# Patient Record
Sex: Female | Born: 1945 | Race: White | Hispanic: No | Marital: Married | Smoking: Never smoker
Health system: Southern US, Community
[De-identification: ages and names within clinical notes are randomized; demographics above are authoritative.]

## PROBLEM LIST (undated history)

## (undated) DIAGNOSIS — K219 Gastro-esophageal reflux disease without esophagitis: Secondary | ICD-10-CM

## (undated) DIAGNOSIS — T84018A Broken internal joint prosthesis, other site, initial encounter: Secondary | ICD-10-CM

## (undated) DIAGNOSIS — R109 Unspecified abdominal pain: Secondary | ICD-10-CM

## (undated) DIAGNOSIS — E785 Hyperlipidemia, unspecified: Secondary | ICD-10-CM

## (undated) DIAGNOSIS — J9600 Acute respiratory failure, unspecified whether with hypoxia or hypercapnia: Secondary | ICD-10-CM

## (undated) DIAGNOSIS — Z923 Personal history of irradiation: Secondary | ICD-10-CM

## (undated) DIAGNOSIS — E669 Obesity, unspecified: Secondary | ICD-10-CM

## (undated) DIAGNOSIS — M479 Spondylosis, unspecified: Secondary | ICD-10-CM

## (undated) DIAGNOSIS — M8580 Other specified disorders of bone density and structure, unspecified site: Secondary | ICD-10-CM

## (undated) DIAGNOSIS — M1991 Primary osteoarthritis, unspecified site: Secondary | ICD-10-CM

## (undated) DIAGNOSIS — K5730 Diverticulosis of large intestine without perforation or abscess without bleeding: Secondary | ICD-10-CM

## (undated) DIAGNOSIS — M1990 Unspecified osteoarthritis, unspecified site: Secondary | ICD-10-CM

## (undated) DIAGNOSIS — Z973 Presence of spectacles and contact lenses: Secondary | ICD-10-CM

## (undated) DIAGNOSIS — M5412 Radiculopathy, cervical region: Secondary | ICD-10-CM

## (undated) DIAGNOSIS — Q6589 Other specified congenital deformities of hip: Secondary | ICD-10-CM

## (undated) DIAGNOSIS — Z8489 Family history of other specified conditions: Secondary | ICD-10-CM

## (undated) DIAGNOSIS — Z8673 Personal history of transient ischemic attack (TIA), and cerebral infarction without residual deficits: Secondary | ICD-10-CM

## (undated) DIAGNOSIS — Z9189 Other specified personal risk factors, not elsewhere classified: Secondary | ICD-10-CM

## (undated) DIAGNOSIS — IMO0002 Reserved for concepts with insufficient information to code with codable children: Secondary | ICD-10-CM

## (undated) DIAGNOSIS — M129 Arthropathy, unspecified: Secondary | ICD-10-CM

## (undated) DIAGNOSIS — C50919 Malignant neoplasm of unspecified site of unspecified female breast: Secondary | ICD-10-CM

## (undated) DIAGNOSIS — R51 Headache: Secondary | ICD-10-CM

## (undated) DIAGNOSIS — Z9221 Personal history of antineoplastic chemotherapy: Secondary | ICD-10-CM

## (undated) DIAGNOSIS — G939 Disorder of brain, unspecified: Secondary | ICD-10-CM

## (undated) DIAGNOSIS — I10 Essential (primary) hypertension: Secondary | ICD-10-CM

## (undated) DIAGNOSIS — Z85828 Personal history of other malignant neoplasm of skin: Secondary | ICD-10-CM

## (undated) DIAGNOSIS — R079 Chest pain, unspecified: Secondary | ICD-10-CM

## (undated) DIAGNOSIS — Z8672 Personal history of thrombophlebitis: Secondary | ICD-10-CM

## (undated) DIAGNOSIS — M4312 Spondylolisthesis, cervical region: Secondary | ICD-10-CM

## (undated) DIAGNOSIS — Z96649 Presence of unspecified artificial hip joint: Secondary | ICD-10-CM

## (undated) DIAGNOSIS — Z9889 Other specified postprocedural states: Secondary | ICD-10-CM

## (undated) DIAGNOSIS — E7800 Pure hypercholesterolemia, unspecified: Secondary | ICD-10-CM

## (undated) DIAGNOSIS — R112 Nausea with vomiting, unspecified: Secondary | ICD-10-CM

## (undated) DIAGNOSIS — Z96611 Presence of right artificial shoulder joint: Secondary | ICD-10-CM

## (undated) DIAGNOSIS — M19011 Primary osteoarthritis, right shoulder: Secondary | ICD-10-CM

## (undated) HISTORY — DX: Reserved for concepts with insufficient information to code with codable children: IMO0002

## (undated) HISTORY — DX: Chest pain, unspecified: R07.9

## (undated) HISTORY — PX: MULTIPLE TOOTH EXTRACTIONS: SHX2053

## (undated) HISTORY — PX: CATARACT EXTRACTION W/ INTRAOCULAR LENS  IMPLANT, BILATERAL: SHX1307

## (undated) HISTORY — PX: SHOULDER ARTHROSCOPY: SHX128

## (undated) HISTORY — DX: Disorder of brain, unspecified: G93.9

## (undated) HISTORY — DX: Presence of unspecified artificial hip joint: Z96.649

## (undated) HISTORY — DX: Presence of right artificial shoulder joint: Z96.611

## (undated) HISTORY — DX: Other specified disorders of bone density and structure, unspecified site: M85.80

## (undated) HISTORY — DX: Arthropathy, unspecified: M12.9

## (undated) HISTORY — DX: Hyperlipidemia, unspecified: E78.5

## (undated) HISTORY — DX: Other specified congenital deformities of hip: Q65.89

## (undated) HISTORY — DX: Pure hypercholesterolemia, unspecified: E78.00

## (undated) HISTORY — PX: CHOLECYSTECTOMY: SHX55

## (undated) HISTORY — DX: Malignant neoplasm of unspecified site of unspecified female breast: C50.919

## (undated) HISTORY — DX: Diverticulosis of large intestine without perforation or abscess without bleeding: K57.30

## (undated) HISTORY — DX: Unspecified osteoarthritis, unspecified site: M19.90

## (undated) HISTORY — DX: Radiculopathy, cervical region: M54.12

## (undated) HISTORY — PX: COLONOSCOPY: SHX174

## (undated) HISTORY — DX: Unspecified abdominal pain: R10.9

## (undated) HISTORY — DX: Gastro-esophageal reflux disease without esophagitis: K21.9

## (undated) HISTORY — DX: Other specified personal risk factors, not elsewhere classified: Z91.89

## (undated) HISTORY — DX: Essential (primary) hypertension: I10

## (undated) HISTORY — DX: Obesity, unspecified: E66.9

## (undated) HISTORY — PX: ROTATOR CUFF REPAIR: SHX139

## (undated) HISTORY — DX: Primary osteoarthritis, unspecified site: M19.91

## (undated) HISTORY — DX: Acute respiratory failure, unspecified whether with hypoxia or hypercapnia: J96.00

## (undated) HISTORY — PX: TONSILLECTOMY: SHX5217

## (undated) HISTORY — PX: BUNIONECTOMY: SHX129

## (undated) HISTORY — DX: Spondylosis, unspecified: M47.9

## (undated) HISTORY — PX: TONSILLECTOMY: SUR1361

## (undated) HISTORY — PX: FOOT SURGERY: SHX648

## (undated) HISTORY — PX: TOTAL HIP ARTHROPLASTY: SHX124

## (undated) HISTORY — DX: Personal history of thrombophlebitis: Z86.72

## (undated) HISTORY — DX: Broken internal joint prosthesis, other site, initial encounter: T84.018A

## (undated) HISTORY — PX: OTHER SURGICAL HISTORY: SHX169

## (undated) HISTORY — DX: Spondylolisthesis, cervical region: M43.12

## (undated) HISTORY — PX: THUMB ARTHROSCOPY: SHX2509

---

## 1994-11-08 DIAGNOSIS — Z923 Personal history of irradiation: Secondary | ICD-10-CM

## 1994-11-08 DIAGNOSIS — Z9221 Personal history of antineoplastic chemotherapy: Secondary | ICD-10-CM

## 1994-11-08 HISTORY — PX: BREAST BIOPSY: SHX20

## 1994-11-08 HISTORY — DX: Personal history of irradiation: Z92.3

## 1994-11-08 HISTORY — PX: BREAST LUMPECTOMY: SHX2

## 1994-11-08 HISTORY — DX: Personal history of antineoplastic chemotherapy: Z92.21

## 1995-11-09 DIAGNOSIS — Z9221 Personal history of antineoplastic chemotherapy: Secondary | ICD-10-CM

## 1995-11-09 HISTORY — DX: Personal history of antineoplastic chemotherapy: Z92.21

## 1998-05-05 ENCOUNTER — Observation Stay (HOSPITAL_COMMUNITY): Admission: RE | Admit: 1998-05-05 | Discharge: 1998-05-06 | Payer: Self-pay | Admitting: Orthopedic Surgery

## 1999-07-16 ENCOUNTER — Other Ambulatory Visit: Admission: RE | Admit: 1999-07-16 | Discharge: 1999-07-16 | Payer: Self-pay | Admitting: *Deleted

## 1999-09-07 ENCOUNTER — Observation Stay (HOSPITAL_COMMUNITY): Admission: RE | Admit: 1999-09-07 | Discharge: 1999-09-08 | Payer: Self-pay | Admitting: Orthopedic Surgery

## 2000-01-18 ENCOUNTER — Other Ambulatory Visit: Admission: RE | Admit: 2000-01-18 | Discharge: 2000-01-18 | Payer: Self-pay | Admitting: *Deleted

## 2000-01-20 ENCOUNTER — Encounter: Admission: RE | Admit: 2000-01-20 | Discharge: 2000-01-20 | Payer: Self-pay | Admitting: *Deleted

## 2000-05-17 ENCOUNTER — Other Ambulatory Visit: Admission: RE | Admit: 2000-05-17 | Discharge: 2000-05-17 | Payer: Self-pay | Admitting: *Deleted

## 2000-09-08 ENCOUNTER — Other Ambulatory Visit: Admission: RE | Admit: 2000-09-08 | Discharge: 2000-09-08 | Payer: Self-pay | Admitting: *Deleted

## 2000-09-13 ENCOUNTER — Encounter: Admission: RE | Admit: 2000-09-13 | Discharge: 2000-09-13 | Payer: Self-pay | Admitting: *Deleted

## 2000-10-07 ENCOUNTER — Inpatient Hospital Stay (HOSPITAL_COMMUNITY): Admission: EM | Admit: 2000-10-07 | Discharge: 2000-10-08 | Payer: Self-pay | Admitting: Emergency Medicine

## 2000-10-07 ENCOUNTER — Encounter: Payer: Self-pay | Admitting: *Deleted

## 2000-12-21 ENCOUNTER — Other Ambulatory Visit: Admission: RE | Admit: 2000-12-21 | Discharge: 2000-12-21 | Payer: Self-pay | Admitting: *Deleted

## 2000-12-21 ENCOUNTER — Encounter (INDEPENDENT_AMBULATORY_CARE_PROVIDER_SITE_OTHER): Payer: Self-pay

## 2001-01-23 ENCOUNTER — Encounter: Admission: RE | Admit: 2001-01-23 | Discharge: 2001-01-23 | Payer: Self-pay | Admitting: *Deleted

## 2001-03-30 ENCOUNTER — Encounter: Payer: Self-pay | Admitting: Specialist

## 2001-04-05 ENCOUNTER — Observation Stay (HOSPITAL_COMMUNITY): Admission: RE | Admit: 2001-04-05 | Discharge: 2001-04-06 | Payer: Self-pay | Admitting: Specialist

## 2001-04-05 ENCOUNTER — Encounter: Payer: Self-pay | Admitting: Specialist

## 2001-08-28 ENCOUNTER — Encounter: Admission: RE | Admit: 2001-08-28 | Discharge: 2001-08-28 | Payer: Self-pay | Admitting: Specialist

## 2001-08-28 ENCOUNTER — Encounter: Payer: Self-pay | Admitting: Specialist

## 2001-12-14 ENCOUNTER — Encounter: Payer: Self-pay | Admitting: Internal Medicine

## 2001-12-14 ENCOUNTER — Ambulatory Visit (HOSPITAL_COMMUNITY): Admission: RE | Admit: 2001-12-14 | Discharge: 2001-12-14 | Payer: Self-pay | Admitting: Internal Medicine

## 2001-12-15 ENCOUNTER — Encounter: Payer: Self-pay | Admitting: Internal Medicine

## 2001-12-15 ENCOUNTER — Encounter: Admission: RE | Admit: 2001-12-15 | Discharge: 2001-12-15 | Payer: Self-pay | Admitting: Internal Medicine

## 2001-12-22 ENCOUNTER — Observation Stay (HOSPITAL_COMMUNITY): Admission: RE | Admit: 2001-12-22 | Discharge: 2001-12-23 | Payer: Self-pay | Admitting: Surgery

## 2001-12-22 ENCOUNTER — Encounter (INDEPENDENT_AMBULATORY_CARE_PROVIDER_SITE_OTHER): Payer: Self-pay | Admitting: Specialist

## 2001-12-22 ENCOUNTER — Encounter: Payer: Self-pay | Admitting: Surgery

## 2002-01-30 ENCOUNTER — Encounter: Admission: RE | Admit: 2002-01-30 | Discharge: 2002-01-30 | Payer: Self-pay | Admitting: *Deleted

## 2002-02-09 ENCOUNTER — Other Ambulatory Visit: Admission: RE | Admit: 2002-02-09 | Discharge: 2002-02-09 | Payer: Self-pay | Admitting: *Deleted

## 2003-01-29 ENCOUNTER — Encounter: Admission: RE | Admit: 2003-01-29 | Discharge: 2003-01-29 | Payer: Self-pay | Admitting: *Deleted

## 2003-02-05 ENCOUNTER — Encounter: Admission: RE | Admit: 2003-02-05 | Discharge: 2003-02-05 | Payer: Self-pay | Admitting: *Deleted

## 2003-02-12 ENCOUNTER — Other Ambulatory Visit: Admission: RE | Admit: 2003-02-12 | Discharge: 2003-02-12 | Payer: Self-pay | Admitting: *Deleted

## 2003-03-20 ENCOUNTER — Encounter: Payer: Self-pay | Admitting: Orthopedic Surgery

## 2003-03-26 ENCOUNTER — Inpatient Hospital Stay (HOSPITAL_COMMUNITY): Admission: RE | Admit: 2003-03-26 | Discharge: 2003-03-27 | Payer: Self-pay | Admitting: Orthopedic Surgery

## 2003-03-26 ENCOUNTER — Encounter: Payer: Self-pay | Admitting: Orthopedic Surgery

## 2003-09-13 ENCOUNTER — Encounter: Admission: RE | Admit: 2003-09-13 | Discharge: 2003-09-13 | Payer: Self-pay | Admitting: Specialist

## 2004-02-06 ENCOUNTER — Encounter: Admission: RE | Admit: 2004-02-06 | Discharge: 2004-02-06 | Payer: Self-pay | Admitting: Internal Medicine

## 2004-03-10 ENCOUNTER — Other Ambulatory Visit: Admission: RE | Admit: 2004-03-10 | Discharge: 2004-03-10 | Payer: Self-pay | Admitting: *Deleted

## 2004-04-22 ENCOUNTER — Inpatient Hospital Stay (HOSPITAL_COMMUNITY): Admission: RE | Admit: 2004-04-22 | Discharge: 2004-04-28 | Payer: Self-pay | Admitting: Orthopaedic Surgery

## 2005-02-08 ENCOUNTER — Encounter: Admission: RE | Admit: 2005-02-08 | Discharge: 2005-02-08 | Payer: Self-pay | Admitting: *Deleted

## 2006-03-18 ENCOUNTER — Encounter: Admission: RE | Admit: 2006-03-18 | Discharge: 2006-03-18 | Payer: Self-pay | Admitting: *Deleted

## 2006-10-25 DIAGNOSIS — D3500 Benign neoplasm of unspecified adrenal gland: Secondary | ICD-10-CM | POA: Insufficient documentation

## 2006-11-22 ENCOUNTER — Ambulatory Visit: Payer: Self-pay | Admitting: Internal Medicine

## 2006-12-15 ENCOUNTER — Encounter (INDEPENDENT_AMBULATORY_CARE_PROVIDER_SITE_OTHER): Payer: Self-pay | Admitting: Specialist

## 2006-12-15 ENCOUNTER — Ambulatory Visit: Payer: Self-pay | Admitting: Internal Medicine

## 2006-12-15 DIAGNOSIS — K5730 Diverticulosis of large intestine without perforation or abscess without bleeding: Secondary | ICD-10-CM

## 2006-12-15 HISTORY — DX: Diverticulosis of large intestine without perforation or abscess without bleeding: K57.30

## 2007-01-17 ENCOUNTER — Ambulatory Visit: Payer: Self-pay | Admitting: Internal Medicine

## 2007-01-23 ENCOUNTER — Ambulatory Visit: Payer: Self-pay | Admitting: Internal Medicine

## 2007-03-13 ENCOUNTER — Encounter: Admission: RE | Admit: 2007-03-13 | Discharge: 2007-03-13 | Payer: Self-pay | Admitting: Orthopaedic Surgery

## 2007-03-27 ENCOUNTER — Encounter: Admission: RE | Admit: 2007-03-27 | Discharge: 2007-03-27 | Payer: Self-pay | Admitting: Obstetrics and Gynecology

## 2007-03-30 ENCOUNTER — Ambulatory Visit: Payer: Self-pay | Admitting: Internal Medicine

## 2007-04-14 ENCOUNTER — Encounter: Admission: RE | Admit: 2007-04-14 | Discharge: 2007-04-14 | Payer: Self-pay | Admitting: Orthopaedic Surgery

## 2007-06-21 ENCOUNTER — Inpatient Hospital Stay (HOSPITAL_COMMUNITY): Admission: RE | Admit: 2007-06-21 | Discharge: 2007-06-24 | Payer: Self-pay | Admitting: Orthopaedic Surgery

## 2008-04-02 ENCOUNTER — Encounter: Admission: RE | Admit: 2008-04-02 | Discharge: 2008-04-02 | Payer: Self-pay | Admitting: Obstetrics & Gynecology

## 2008-04-23 DIAGNOSIS — Z9189 Other specified personal risk factors, not elsewhere classified: Secondary | ICD-10-CM

## 2008-04-23 DIAGNOSIS — I10 Essential (primary) hypertension: Secondary | ICD-10-CM

## 2008-04-23 DIAGNOSIS — Z8672 Personal history of thrombophlebitis: Secondary | ICD-10-CM

## 2008-04-23 DIAGNOSIS — M129 Arthropathy, unspecified: Secondary | ICD-10-CM

## 2008-04-23 DIAGNOSIS — Q6589 Other specified congenital deformities of hip: Secondary | ICD-10-CM

## 2008-04-23 DIAGNOSIS — C50919 Malignant neoplasm of unspecified site of unspecified female breast: Secondary | ICD-10-CM | POA: Insufficient documentation

## 2008-04-23 DIAGNOSIS — M479 Spondylosis, unspecified: Secondary | ICD-10-CM

## 2008-04-23 DIAGNOSIS — I6782 Cerebral ischemia: Secondary | ICD-10-CM

## 2008-04-23 DIAGNOSIS — G939 Disorder of brain, unspecified: Secondary | ICD-10-CM

## 2008-04-23 DIAGNOSIS — E7800 Pure hypercholesterolemia, unspecified: Secondary | ICD-10-CM | POA: Insufficient documentation

## 2008-04-23 HISTORY — DX: Other specified congenital deformities of hip: Q65.89

## 2008-04-23 HISTORY — DX: Cerebral ischemia: I67.82

## 2008-04-23 HISTORY — DX: Arthropathy, unspecified: M12.9

## 2008-04-23 HISTORY — DX: Essential (primary) hypertension: I10

## 2008-04-23 HISTORY — DX: Personal history of thrombophlebitis: Z86.72

## 2008-04-23 HISTORY — DX: Malignant neoplasm of unspecified site of unspecified female breast: C50.919

## 2008-04-23 HISTORY — DX: Spondylosis, unspecified: M47.9

## 2008-04-23 HISTORY — DX: Other specified personal risk factors, not elsewhere classified: Z91.89

## 2008-04-23 HISTORY — DX: Pure hypercholesterolemia, unspecified: E78.00

## 2008-08-27 ENCOUNTER — Telehealth: Payer: Self-pay | Admitting: Internal Medicine

## 2009-04-02 ENCOUNTER — Encounter: Admission: RE | Admit: 2009-04-02 | Discharge: 2009-04-02 | Payer: Self-pay | Admitting: Orthopaedic Surgery

## 2009-04-03 ENCOUNTER — Encounter: Admission: RE | Admit: 2009-04-03 | Discharge: 2009-04-03 | Payer: Self-pay | Admitting: Obstetrics & Gynecology

## 2009-07-31 ENCOUNTER — Encounter: Admission: RE | Admit: 2009-07-31 | Discharge: 2009-07-31 | Payer: Self-pay | Admitting: Orthopaedic Surgery

## 2009-08-26 ENCOUNTER — Encounter: Admission: RE | Admit: 2009-08-26 | Discharge: 2009-08-26 | Payer: Self-pay | Admitting: Obstetrics & Gynecology

## 2010-04-28 ENCOUNTER — Encounter: Admission: RE | Admit: 2010-04-28 | Discharge: 2010-04-28 | Payer: Self-pay | Admitting: Orthopedic Surgery

## 2010-05-07 ENCOUNTER — Ambulatory Visit (HOSPITAL_COMMUNITY): Admission: RE | Admit: 2010-05-07 | Discharge: 2010-05-07 | Payer: Self-pay | Admitting: Orthopedic Surgery

## 2010-05-15 ENCOUNTER — Ambulatory Visit (HOSPITAL_COMMUNITY): Admission: RE | Admit: 2010-05-15 | Discharge: 2010-05-16 | Payer: Self-pay | Admitting: Orthopedic Surgery

## 2010-05-24 ENCOUNTER — Observation Stay (HOSPITAL_COMMUNITY): Admission: EM | Admit: 2010-05-24 | Discharge: 2010-05-26 | Payer: Self-pay | Admitting: Emergency Medicine

## 2010-07-14 ENCOUNTER — Encounter: Admission: RE | Admit: 2010-07-14 | Discharge: 2010-07-14 | Payer: Self-pay | Admitting: Orthopaedic Surgery

## 2010-08-28 ENCOUNTER — Encounter: Admission: RE | Admit: 2010-08-28 | Discharge: 2010-08-28 | Payer: Self-pay | Admitting: Obstetrics & Gynecology

## 2011-01-23 LAB — BASIC METABOLIC PANEL
CO2: 30 mEq/L (ref 19–32)
Calcium: 9.6 mg/dL (ref 8.4–10.5)
Creatinine, Ser: 0.6 mg/dL (ref 0.4–1.2)
GFR calc Af Amer: >60 mL/min (ref >60)
Glucose, Bld: 102 mg/dL — ABNORMAL HIGH (ref 70–99)

## 2011-01-23 LAB — CBC
MCH: 30.5 pg (ref 26.0–34.0)
MCHC: 33.6 g/dL (ref 30.0–36.0)
Platelets: 258 10*3/uL (ref 150–400)

## 2011-01-23 LAB — DIFFERENTIAL
Basophils Relative: 1 % (ref 0–1)
Eosinophils Absolute: 0.2 10*3/uL (ref 0.0–0.7)
Neutrophils Relative %: 47 % (ref 43–77)

## 2011-01-24 LAB — BASIC METABOLIC PANEL
Calcium: 9.5 mg/dL (ref 8.4–10.5)
GFR calc Af Amer: >60 mL/min (ref >60)
GFR calc non Af Amer: >60 mL/min (ref >60)
Glucose, Bld: 90 mg/dL (ref 70–99)
Sodium: 143 mEq/L (ref 135–145)

## 2011-01-24 LAB — SURGICAL PCR SCREEN: MRSA, PCR: NEGATIVE

## 2011-03-23 NOTE — Op Note (Signed)
NAMEMARQUETTA, [name]              ACCOUNT NO.:  [id number]   MEDICAL RECORD NO.:  [id number]          PATIENT TYPE:  INP   LOCATION:  2550                         FACILITY:  [facility]   PHYSICIAN:  [name], M.D.        DATE OF BIRTH:  Mar 21, 1946   DATE OF PROCEDURE:  06/21/2007  DATE OF DISCHARGE:                               OPERATIVE REPORT   DIAGNOSIS:  1. 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  2. Lumbar spinal stenosis   PROCEDURE:  1. Exploration of L2-L3 and L3-L4 fusion with removal of      instrumentation.  2. Posterior spinal fusion L4 through S1.  3. Segmental pedicle screw instrumentation L3 through S1.  4. Lumbar laminectomy L4-L5 and L5-S1 with decompression of the thecal      sac and nerve roots.  5. Transforaminal lumbar interbody fusion L4-L5 and L5-S1 with      placement of two PEEK cages.  6. Local autogenous bone graft supplemented with 5 mL Grafton      allograft and OP1 BMP   SURGEON:  [name], M.D.   ASSISTANT:  Colleen Mahar, P.A.-C.   ANESTHESIA:  General endotracheal.   ESTIMATED BLOOD LOSS:  400 mL   COMPLICATIONS:  None.   COUNTS:  Needle and sponge counts correct.   INDICATIONS:  The patient is a pleasant 65 year old female who is three  years status post thoracolumbar fusion for adult scoliosis.  She did  very well after the surgery and became essentially pain free.  Over the  past several months, she has developed severe lower back pain as well as  weakness in the left lower extremity.  Her imaging studies show advanced  degeneration at L4-L5 and L5-S1 which has progressed over the years.  She has failed other conservative treatments and now elects to undergo  extension of the decompression fusion across the L4-5 and L5-S1  segments.  Risks, benefits, and alternatives were reviewed and the  patient elected to proceed.   DESCRIPTION OF PROCEDURE:  After informed consent, she  was taken to the  operating room.  She underwent general endotracheal anesthesia without  difficulty and given prophylactic IV antibiotics.  Neural monitoring was  established in the form of lower extremity EMGs and SSEPs.  She was  carefully turned prone onto the AcroMed positioning frame.  All bony  prominences were padded.  The face and eyes were protected at all times.  The back was prepped and draped in the usual sterile fashion.  The  previous midline incision was utilized.  Dissection was carried through  the deep fascia.  There was a significant amount of scarring, as  expected, from the previous surgery.  The L5 and S1 spinous processes  were remaining and they were identified and used as landmarks.  Dissection was carried proximally and the instrumentation was identified  and exposed.  Deep retractors were placed.   We then performed further dissection through the scar tissue exposing  the bony elements.  We were then able to see enough  of the anatomy to  place pedicle screws at L4, L5 and S1 on the left side using anatomic  probing technique.  We utilized 6.5 mm screws at L4 and L5 and 7.5 mm  screws in the sacrum.  Before placing the screws, the pedicle holes were  palpated and there were no breeches.  Each pedicle was then tapped and  then, once again, palpated.  The bone quality was good and the screw  purchase was excellent.  We then performed a similar procedure on the  right side at L5 and S1.  There was already a pedicle screw at L4 on the  right.  Before placing the pedicle screws, we decorticated the  transverse processes of L4, L5 and the sacral ala bilaterally in  preparation for the arthrodesis.   At this point, we turned our attention to performing a laminectomy.  Further debridement of the scar tissue allowed exposure of the  ligamentum flavum and underlying lamina.  A high speed bur was used to  take down the lamina and then the Kerrison punches were utilized  to  complete the laminectomy and remove the ligamentum flavum.  The lateral  recesses were decompressed.  We then morselized the laminectomy bone and  mixed it with 5 mL of Grafton allograft and OP1 BMP.  This mixture was  then packed into the lateral gutters from L4 down to S1 completing the  posterior spinal fusion.  At this point, we elected to proceed with a  transforaminal lumbar interbody fusion at L4-L5 and L5-S1 on the left  side to further decompress the neural foramen and also to further reduce  the scoliosis and improve the fusion rate.   On the left side, the remaining facet joints were osteotomized. The  exiting transversing nerve roots were identified and free running EMGs  were monitored at all times.  Starting at L4-L5, the disc space was  entered and a radical discectomy was completed.  We dilated the disc  space up to 8 mm.  We then cleaned the cartilaginous endplates with  curved curets.  The disc space was packed with the bone graft mixture.  We then packed an 8 mm PEEK cage with the bone graft mixture, tamped it  into the interspace in an oblique fashion.  We achieved good distraction  of the foramen.  We performed a similar procedure at L5-S1. Again, we  utilized an 8 mm implant.  The disc space was cleaned out and the  cartilaginous endplates were scraped clean.  The disc space was again  packed with the bone graft mixture and then the cage was inserted.  Throughout the TLIF procedures, there were no deleterious changes in the  free running EMGs.   At this point, we completed the posterior spinal fusion by attaching  titanium rods to the polyaxial screw heads on the left side at L4, L5  and S1.  On the right side, we cut the rod just above the L3 pedicle  screw.  We then explored the arthrodesis at L2-L3 and L3-L4 and this was  done using the electrocautery.  We confirmed that there was a solid  fusion between L2-L3 and L3-L4 and proceeded to place a new rod from  L2  all the way down to S1.  This was placed into the pedicle screw heads on  the right side.  We applied compression on this side at L4-L5 and L5-S1  to help reduce the spinal deformity.  The locking caps were  placed and  sheared off.  We placed a cross connector distally.  The wound was  irrigated.  A deep Hemovac drain was placed.  Meticulous hemostasis was  achieved.  The deep fascia was closed with a running #1 Vicryl suture,  the subcutaneous layer closed with 0 Vicryl, 2-0 Vicryl, followed by  running 3-0 subcuticular Vicryl suture on the skin edge.  Dermabond was  applied.  A sterile dressing was placed.  The patient was turned supine,  extubated without difficulty, and transferred to the recovery room in  stable condition.   It should be noted my assistant, [employer], P.A.-C., was present  throughout procedure.  She assisted with the positioning, the exposure  by using the Cobb elevators and the suction.  She then assisted with the  decompression by retracting the neural elements and also with the  arthrodesis, the TLIF and the instrumentation.  She then assisted with  wound closure.      [name], M.D.  Electronically Signed     MC/MEDQ  D:  06/21/2007  T:  06/22/2007  Job:  [id number]

## 2011-03-23 NOTE — Assessment & Plan Note (Signed)
[facility]                         GASTROENTEROLOGY OFFICE NOTE   NAME:[name], [name]                     MRN:          [id number]  DATE:03/30/2007                            DOB:          02/21/1946    [name] is a very nice 65 year old white female with newly  diagnosed inflammatory bowel disease, positive IBD markers consistent  with Crohn's disease.  She also has a positive family history of colon  cancer in her father and history of breast CA.  She is being evaluated  by Dr. Noel Gerold for spinal fusion due to severe DJD of her back.  Dr. Noel Gerold  has been reluctant to schedule her for surgery because she has been on  immunomodulator, 6-Mercaptopurine 50 mg a day which she started four  weeks ago.  She also has continued on a tapering dose of Anticort which  is budesonide.  She is down to 3 mg daily.  Her symptoms of IBD are  completely resolved.  She denies any abdominal pain or diarrhea.   PHYSICAL EXAMINATION:  VITAL SIGNS:  Blood pressure 120/82, pulse 78,  weight 169 pounds.   The patient was not reexamined today.   IMPRESSION:  33. A 65 year old white female with inflammatory bowel disease,      currently asymptomatic.  2. 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  3. The patient needs orthopedic surgery by Dr. Noel Gerold which requires      her to be off of her immunosuppressive medications.   PLAN:  It is okay to discontinue her 6-MP and Anticort.  If the diarrhea  recurs, we will use Lomotil or antibiotics or some other antimotility  agents to control it.  I suspect she will probably need to be off of  these medications  for about 12 months before we can consider restarting  it.  I would like to see her in the office in about three months after  her surgery to discuss her condition and her colitis.  We will be happy  to see the patient in the hospital if necessary.     [name]. Anna Chance, MD  Electronically  Signed    DMB/MedQ  DD: 03/30/2007  DT: 03/30/2007  Job #: [id number]   cc:   [name], M.D.  [name], M.D.

## 2011-03-23 NOTE — Discharge Summary (Signed)
[name], [name]              ACCOUNT NO.:  [id number]   MEDICAL RECORD NO.:  [id number]          PATIENT TYPE:  INP   LOCATION:  5009                         FACILITY:  [facility]   PHYSICIAN:  Valetta Close, M.D.   DATE OF BIRTH:  Apr 30, 1946   DATE OF ADMISSION:  06/21/2007  DATE OF DISCHARGE:  06/24/2007                               DISCHARGE SUMMARY   ADMISSION DIAGNOSES:  1. 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  2. History of breast cancer.  3. Crohn disease.  4. Gastroesophageal reflux disease.  5. Hypertension.  6. Migraine headaches.  7. Osteoarthritis.   DISCHARGE DIAGNOSES:  1. Status post revision posterior spinal fusion with extension of her      hardware down from L4 to the sacrum.  2. Postoperative blood loss anemia.  3. Postoperative mild hypokalemia.  4. 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  5. History of breast cancer.  6. Crohn disease.  7. Gastroesophageal reflux disease.  8. Hypertension.  9. Migraine headaches.  10.Osteoarthritis.   PROCEDURE:  On June 21, 2007, the patient was taken to the operating  room for posterior spinal fusion, L4 to S1, laminectomy, L4-S1.  Surgeon  was Sharolyn Douglas, MD, assistant [facility] PA-C.  Anesthesia was  general.   CONSULTATIONS:  None.   LABS:  CBC with differential preoperatively was normal with the  exception of a hemoglobin of 15.3.  Coagulation studies preop normal.  Complete metabolic panel preop was normal.  Postoperatively H&H were  monitored daily x3 days.  Hemoglobin reached a low of 11.0, hematocrit  of 32.5 on June 13, 2007.  Basic metabolic panel monitored x2 days  postoperatively.  She developed mild hypokalemia on June 23, 2007, of  3.3, otherwise remained normal throughout her hospital course.  UA from  preop was negative with the exception of pH of 8.5.  Her blood type is  O+, antibody screen was  negative.  Urine culture from preop showed  insignificant growth.  X-rays were done postoperatively to confirm screw  placement.  EKG from June 19, 2007, shows sinus rhythm with marked  sinus arrhythmia, otherwise normal, unconfirmed on the chart.   BRIEF HISTORY:  The patient is a 65 year old female who underwent a  scoliosis fusion 3 years ago and did extremely well after surgery for  approximately 2-1/2 years.  Unfortunately, she started developing pain  and weakness into her lower extremities as well as increasing back pain.  She was found to have significant of progression of her arthritis and  degeneration in the segments the scoliosis from L4 to S1.  After failing  conservative treatment, Dr. Noel Gerold felt her best course of management  would be extending the scoliosis fusion from L4 down across the sacrum.  The risks and benefits of the procedure were discussed with the patient  at length by Dr. Noel Gerold as well as myself.  She indicated understanding  and opted to proceed.   HOSPITAL COURSE:  On June 21, 2007, the patient was  taken to the  operating room for the above-listed procedure.  She tolerated the  procedure well without any intraoperative complications and was then  transferred to the recovery room in stable condition.   Postoperatively routine orthopedic spine protocol was followed and she  progressed along very well.   Physical therapy and occupational therapy worked with the patient on a  daily basis on a progressive ambulation program, brace use and back  precautions.  She progressed along well with them and was independent  with all of the above prior to discharge.   The patient did not develop any medical complications through her  postoperative stay.  She was stable for discharge home by June 24, 2007.   DISCHARGE/PLAN:  The patient is a 65 year old female status post  revision and extension of scoliosis fusion across L4 to S1.  Doing very  well.    ACTIVITIES:  Daily ambulation program.  Brace on when she is up.  Back  precautions at all times.  No lifting over 5 pounds.  The patient may  shower.   FOLLOW-UP:  Two weeks postoperatively with Dr. Noel Gerold.   MEDICATIONS ON DISCHARGE:  Dilaudid for pain, Robaxin for muscle spasm,  multivitamin daily, calcium daily, Colace twice daily.  Laxative as  needed.  Avoid NSAIDs x3 months.  Resume home medications.   Follow up 2 weeks postoperatively.   CONDITION ON DISCHARGE:  Stable, improved.   DISPOSITION:  The patient is to be discharged to her home with a  friend's assistance and home health physical therapy and occupational  therapy.      [name], P.A.    ______________________________  Valetta Close, M.D.    CM/MEDQ  D:  06/24/2007  T:  06/25/2007  Job:  [id number]

## 2011-03-23 NOTE — Op Note (Signed)
[name], [name]              ACCOUNT NO.:  [id number]   MEDICAL RECORD NO.:  [id number]          PATIENT TYPE:  INP   LOCATION:  2550                         FACILITY:  [facility]   PHYSICIAN:  Sharolyn Douglas, M.D.        DATE OF BIRTH:  [birth year]   DATE OF PROCEDURE:  06/21/2007  DATE OF DISCHARGE:                               OPERATIVE REPORT   DIAGNOSIS:  1. Lumbar spondylosis and adjacent segment degeneration L4-L5 and L5-      S1 below a previous scoliosis fusion.  2. Lumbar spinal stenosis and foraminal narrowing.   PROCEDURE:  1. Exploration of fusion L2-L3 and L3-L4 with removal of      instrumentation.  2. Posterior spinal fusion L4 through S1.  3. Segmental pedicle screw instrumentation L4 through S1.  4. Transforaminal lumbar interbody fusion L4-L5 and L5-S1 with      placement of two PEEK cages.  5. Revision laminectomy L4-L5 and L5-S1   SURGEON:  Sharolyn Douglas, M.D.   ASSISTANT:  Jill Side Mahar, P.A.-C.   Dictation ends here.      Sharolyn Douglas, M.D.  Electronically Signed     MC/MEDQ  D:  06/21/2007  T:  06/22/2007  Job:  [id number]

## 2011-03-26 NOTE — Assessment & Plan Note (Signed)
[facility]                         GASTROENTEROLOGY OFFICE NOTE   NAME:[name], [name]                     MRN:          [id number]  DATE:11/22/2006                            DOB:          19-Oct-1946    Ms. Enneking is a very nice 65 year old white female who is here today  for evaluation of diarrhea, gas, frequent bowel movements, change in the  caliber of the stools. We saw her in the past for similar problems. 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   MEDICATIONS:  1. Evista 60 mg p.o. daily.  2. Celebrex 200 mg p.o. daily.  3. Darvocet-N 100.  4. Ranitidine 300 mg p.o. daily.  5. Lisinopril 12.5 mg p.o. daily.   PAST MEDICAL HISTORY:  Significant for high blood pressure, high  cholesterol, arthritis for 20 years.   OPERATIONS:  Cholecystectomy 2003, tubal ligation 1980, breast surgery  for breast cancer 1996.   FAMILY HISTORY:  Positive for colon cancer in her father, heart disease  in father, breast cancer in mother and grandmother.   SOCIAL HISTORY:  Married with four children, two of them are  stepchildren. 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 Does not  smoke. Drinks alcohol seldom.   REVIEW OF SYSTEMS:  Stable weight, eye glasses, arthritic  complains,  backpain, new headaches, shortness of breath, muscle pains.   PHYSICAL EXAMINATION:  VITAL SIGNS:  Blood pressure 102/72, pulse 100,  weight 169 pounds.  GENERAL:  She was alert, oriented, and in no distress.  NECK:  Thyroid was normal.  NEUROLOGICAL:  No resting tremor.  LUNGS:  Clear to auscultation.  COR:  With normal S1, S2.  ABDOMEN:  Soft with hyperactive bowel sounds. No tenderness except in  the right lower quadrant. No palpable  mass. Liver edge at costal margin,  post cholecystotomy scar.  RECTAL:  Normal rectal tone. Hemoccult negative.   CT scan of the abdomen done on October 25, 2006 showed status post  cholecystectomy with no acute changes, some sigmoid diverticulosis  without diverticulitis.   IMPRESSION:  A 65 year old white female with chronic diarrhea now having  up to eight loose bowel movements a day without any occult blood loss.  May need to rule out microscopic or collagenous  colitis, inflammatory  bowel disease of the terminal ileum or  bacterial overgrowth due to  incompetent ileocecal valve which causes reflux of the bacteria into the  small bowel. Rule out post cholecystectomy, diarrhea. Rule out irritable  bowel syndrome. Rule out celiac sprue.   PLAN:  1. Tissue transglutaminase  levels today. Also sedimentation rate and      IBD markers.  2. A colonoscopy scheduled.  3. Questran 4 g daily in water or juice, take at least 1 hour apart      from other medications  4. Levbid 0.375 mg p.o. b.i.d. to slow  down the transit time. Consider      small bowel followthrough or further studies to evaluate her      diarrhea.     Hedwig Morton. Juanda Chance, MD  Electronically Signed    DMB/MedQ  DD: 11/22/2006  DT: 11/23/2006  Job #: [id number]   cc:   Melida Quitter, M.D.

## 2011-03-26 NOTE — Op Note (Signed)
[facility]  Patient:    [name], [name] Visit Number: [id number] MRN: [id number]          Service Type: SUR Location: 4E 0420 01 Attending Physician:  Charlton Haws Dictated by:   Currie Paris, M.D. Proc. Date: 12/22/01 Admit Date:  12/22/2001                             Operative Report  PREOPERATIVE DIAGNOSIS:  Chronic cholecystitis.  POSTOPERATIVE DIAGNOSIS:  Chronic cholecystitis.  OPERATION PERFORMED:  Laparoscopic cholecystectomy with the operative cholangiogram.  SURGEON:  Currie Paris, M.D.  ASSISTANT:  Anselm Pancoast. Zachery Dakins, M.D.  ANESTHESIA:  General endotracheal.  CLINICAL HISTORY:  This patient is a 65 year old with biliary type symptoms although no documented gallbladder stones nor biliary emptying problem could be documented. After a lengthy discussion with the patient, she elected to proceed to cholecystectomy understanding that this may or may relieve her symptoms but she did have fairly classic biliary type disease.  DESCRIPTION OF PROCEDURE:  The patient was brought to the operating room and after satisfactory general endotracheal anesthesia had been obtained, the abdomen was prepped and draped. The 0.25% Marcaine was used for each incision. The umbilical incision was made first. The peritoneal cavity was entered under direct vision and a pursestring placed and the Hasson introduced. The abdomen was insufflated to 15. No gross abnormalities were noted on ______ of the abdominal cavity. The patient was placed in reverse Trendelenburg and a 10/11 placed in the epigastrium and two 5 mm laterally. There was a lot of omentum stuck along the anterior edge of the liver and had to take this down and then there was omentum basically plastered to the gallbladder from one end all the way from the top all the way down to the cystic duct area. This had to be gradually peeled off using blunt dissection and cautery  and scissors.  Once we got down to the bottom of the gallbladder, I was able to grasp it and open up the peritoneum on either side and identify the triangle of Calot. I was able to dissect out the cystic duct and the cystic artery and have a nice window in the triangle of Calot to be sure there were no other structures back there. One clip was placed on the cystic artery temporarily and then another clip placed on the cystic duct at its junction with the gallbladder.  The cystic duct was opened. No stone material could be manipulated out.  An angiocath was used to thread a Reddick catheter and an operative cholangiogram was done which appeared to be normal. A single static image was able to be saved on the C-arm.  The cystic duct catheter was removed and clips were placed on the cystic duct and it was divided. The artery was dissected out a little bit more and it was divided. The gallbladder was removed from below to above with coagulation of the current. Once it was disconnected, we irrigated and made sure everything was dry. There was a little bit of a raw surface on the top which I left some Surgicel in. The gallbladder was placed in a bag and brought out the umbilical port. The area was again checked for hemostasis and lateral ports removed. The umbilical port was removed and the pursestring tied down. The abdomen was deflated through the epigastric port. All skin incisions were closed with 4-0 monocryl subcuticular plus Steri-Strips. The  patient tolerated the procedure well. There were no operative complications. Dictated by:   Currie Paris, M.D. Attending Physician:  Charlton Haws DD:  12/22/01 TD:  12/22/01 Job: [id number]

## 2011-03-26 NOTE — Op Note (Signed)
NAME:  [name], [name]                        ACCOUNT NO.:  [id number]   MEDICAL RECORD NO.:  [id number]                   PATIENT TYPE:  INP   LOCATION:  E454                                 FACILITY:  [facility]   PHYSICIAN:  Marlowe Kays, M.D.               DATE OF BIRTH:  19-Mar-1946   DATE OF PROCEDURE:  03/26/2003  DATE OF DISCHARGE:                                 OPERATIVE REPORT   PREOPERATIVE DIAGNOSIS:  Osteoarthritis, left shoulder.   POSTOPERATIVE DIAGNOSIS:  Osteoarthritis, left shoulder.   OPERATION:  Cemented bipolar left shoulder arthroplasty.   SURGEON:  Marlowe Kays, M.D.   ASSISTANT:  Georges Lynch. Darrelyn Hillock, M.D.   ANESTHESIA:  General.   PATHOLOGY AND JUSTIFICATION FOR PROCEDURE:  She had some slight  osteoarthritis of the glenoid but not enough to warrant glenoid replacement.   DESCRIPTION OF PROCEDURE:  Prophylactic antibiotics, satisfactory general  anesthesia, slight beach chair position, folded sheets beneath the left  scapula and left arm resting on an arm board. The left shoulder girdle was  prepped with Duraprep, draped in a sterile field, Ioban employed. An  incision was made from roughly the coracoid down to the axillary fold. The  interval between the deltoid and pectoralis major was identified and the two  muscles retracted. The conjoined tendon was identified and a small portion  of it was released from the lateral coracoid to again facilitate exposure.  The subscapularis was cut with cutting cautery about a centimeter from its  insertion and tagged with #1 Ethibond. The humeral head was then exposed and  we used the Biomet cutting jig to make outline for a 55 degree cut with 30  degrees of retroversion. This was roughly through the anatomical neck of the  humeral head. The cut was made and the humeral shaft exposed and we then  began gently reaming it up to a #8 which is what was templated. We then used  rasping to the same level. We then went  through a trial reduction, found  that retroversion was where we wanted it and the bipolar component of 40 mm  diameter and 20 mm neck was the ideal one. Because the canal was too small  for a canal plug, I used Gelfoam after water picking the canal. Methyl  methacrylate was mixed and introduced with the glue gun and pressurized with  a glove. I then placed the prosthesis which we impacted and held until the  glue had hardened with excess methyl methacrylate being removed. We then  placed the final bipolar head, reduced the shoulder which was where we  wanted it and it seemed nice and stable. The wound was then irrigated and  closed. The superior capsule in the shoulder was closed with interrupted #1  Vicryl, subscapularis reattached with a #1 Ethibond, subcutaneous tissue was  closed with a #1 Vicryl deep, 2-0 Vicryl superficially with Steri-Strips on  the skin other  than at the axillary line where I placed some 3-0 Vicryl  sutures to assure a good closure. Dry sterile dressing, shoulder immobilizer  applied. She tolerated the procedure well and was taken to the recovery room  in satisfactory condition with no known complications.                                               Marlowe Kays, M.D.   JA/MEDQ  D:  03/26/2003  T:  03/26/2003  Job:  [id number]

## 2011-03-26 NOTE — H&P (Signed)
[facility]  Patient:    [name], [name]                       MRN: [id number] Adm. Date:  04/05/01 Attending:  Kerrin Champagne, M.D. Dictator:   Alexzandrew L. Julien Girt, P.A.-C. CC:         Georgann Housekeeper, M.D.  Pershing Cox, M.D.   History and Physical  DATE OF OFFICE VISIT HISTORY AND PHYSICAL:  Mar 30, 2001  CHIEF COMPLAINT:  Back pain and leg pain.  HISTORY OF PRESENT ILLNESS:  Patient is a 65 year old female who has been seen and evaluated by Dr. Kerrin Champagne for ongoing problems in her back region. She has been seen and found to have some degenerative disk disease with radicular components.  She has been evaluated and treated conservatively.  She was later referred over to Dr. Caralyn Guile. Ramos for a selective nerve root block.  She did excellent, receiving some relief after her nerve root block; however, the pain did reoccur.  She works on [employer], working part-time at [employer]; however, the pain in her back and leg has started to interfere with her daily activities.  It has reached a point where it is felt she has undergone conservative measures and would benefit from undergoing surgical interventions.  Risks and benefits have been discussed with the patient and she has elected to proceed with surgery.  ALLERGIES: 1. SULFA causes itching and rash. 2. OXYCODONE and TYLOX cause vomiting, swelling and itching. 3. MEPERIDINE/DEMEROL causes itching, rash and also depresses her breathing.  CURRENT MEDICATIONS: 1. Evista 60 mg daily. 2. Hydrochlorothiazide 25 mg daily. 3. Vioxx 25 mg daily. 4. Protonix 40 mg daily. 5. Potassium 20 mEq daily. 6. She takes Darvocet-N 100 p.r.n. pain.  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  PAST SURGICAL HISTORY:  She has undergone a  right total hip replacement in 1987 with subsequent revision in 1997.  She has undergone a left total hip replacement in 1989.  She had a right bunionectomy performed in 1993, a left bunion along with a Mortons neuroma in 1999.  She has undergone breast lumpectomy secondary to cancer in 1996.  She has undergone a left shoulder arthroscopy with subacromial decompression in 2000.  She has also undergone Port-A-Cath insertion in 1996 and removal in 1997 for chemotherapy.  She has also undergone a colonoscopy and a D&C, tonsillectomy, tubal ligation.  SOCIAL HISTORY:  Patient is retired but still working part-time at [employer] with [employer].  She is married and has four children.  Denies the use of tobacco products or alcohol products.  FAMILY HISTORY:  Mother deceased, age 79, with a history of breast cancer, arthritis and degenerative disk disease.  Father deceased, age 24, with a history of colon cancer and heart disease.  She had one sister deceased at age 68 with a history of multiple sclerosis.  Other siblings include one sister living, age 61, with a history of hypoglycemia, one sister, age 54, with two back fusions and a knee replacement; she also has a younger brother, age 43, in good health.  REVIEW OF SYSTEMS:  GENERAL:  No fevers, chills or night sweats.  NEUROLOGIC: No seizures, syncope or paralysis.  RESPIRATORY:  Patient did have a cough approximately two to three weeks ago which she attributes to allergies.  No shortness of breath or hemoptysis.  CARDIOVASCULAR:  No chest pain, angina or orthopnea.  GI:  No nausea, vomiting, diarrhea or constipation.  No blood or mucus in the stool.  GU:  No dysuria, hematuria or discharge. MUSCULOSKELETAL:  Pertinent to the back found in the history of present illness.  PHYSICAL EXAMINATION:  VITAL SIGNS:  Pulse 72.  Respirations 16.  Blood pressure is 138/98.  GENERAL:  Patient is a 65 year old white female,  well-nourished, well-developed, and appears to be in no acute distress.  She is alert, oriented and cooperative at time of exam.  HEENT:  EOMs are intact.  Oropharynx is clear.  NECK:  Supple.  No carotid bruits are appreciated.  CHEST:  Clear to auscultation.  No rhonchi or rales.  HEART:  Regular rate and rhythm.  No murmurs.  S1 and S2 noted.  No rubs, thrills or palpitations are appreciated on exam.  ABDOMEN:  Soft, round abdomen, nontender to palpation.  Bowel sounds are present.  RECTAL:  Not done, not pertinent to present illness.  BREASTS:  Not done, not pertinent to present illness.  GENITALIA:  Not done, not pertinent to present illness.  EXTREMITIES:  Significant of that to the back and lower extremities.  She does have some lumbar pain.  NEUROLOGIC:  On motor function, she is noted to be 5/5 in the lower extremities with the exception of some weakness in the right hip flexors and also weakness with right hip adduction.  Sensation is intact.  She does not ambulate with an antalgic gait.  IMPRESSION: 1. Foraminal stenosis, L1-2, with a herniated nucleus pulposus and right leg    pain. 2. Lumbar degenerative disk disease. 3. Grade 1 spondylolisthesis of L2 on 3. 4. Hypertension. 5. History of breast cancer with subsequent chemotherapy. 6. Acid reflux. 7. Hiatal hernia. 8. History of soft tissue hematoma following childbirth (not a deep vein    clot).  PLAN:  Patient will be admitted to [facility] to undergo a left L1-2 foraminotomy with hemilaminectomy.  Surgery will be performed by Dr. Vira Browns. DD:  03/30/01 TD:  03/30/01 Job: [id number]

## 2011-03-26 NOTE — Discharge Summary (Signed)
[place]  Patient:    [name], [name]                     MRN: [id number] Adm. Date:  [id number] Disc. Date: [id number] Attending:  Lyn Records. Iii Dictator:   Anselm Lis, N.P. CC:         Meade Maw, M.D., primary cardiologist  Dr. Julieanne Manson, primary care Zayven Powe   Discharge Summary  DATE OF BIRTH:  [birth year].  DISCHARGE DIAGNOSES/HOSPITAL SUMMARY:  #1 - CONSTELLATION OF STABLE CONDITION INCLUDING TACHY PALPITATIONS, CHEST DISCOMFORT, AND WEAKNESS, POSSIBLY REPRESENTING PAROXYSMAL ARRHYTHMIA SUCH AS ATRIAL FIBRILLATION, PAROXYSMAL SUPRAVENTRICULAR TACHYCARDIA SELF-TERMINATING: One month prior to admission the patient had been evaluated in the clinic by Meade Maw, M.D., with parasternal chest, left neck and arm discomfort.  A stress Cardiolite was negative for ischemia.  The day of admission she had three episodes of symptoms as listed above, the latest of which lasted 20 to 40 minutes.  En route to the emergency room she had recurrent right parasternal chest discomfort and was treated with nitroglycerin with prompt resolution.  Upon emergency room evaluation she was pain-free with normal EEKG Her initial troponin I was not elevated at 0.08, increased to 0.1 and was 0.07 at the time of discharge.  Her CK-MB fractions were normal.  Her potassium was slightly decreased at 3.4.  She was admitted to telemetry for overnight observation.  She had no recurrence of tachyrhythmia the night of admission. She was discharged to home with plans for follow-up event monitor and follow-up by Dr. Fraser Din and Dr. Julieanne Manson.  #2 - TRACE ELEVATED TROPONIN I:  Suspect related to tachyrhythmias.  CK-MBs were all negative x 3 sets.  First troponin I of 0.8, second troponin I of 0.1 and third troponin I of 0.07.  EKG the morning of discharge was nonischemia. She was discharged to home with instructions to call if she  experiences recurrent discomfort.  #3 - HYPOKALEMIA:  Admission potassium was 3.4.  This was supplemented and serum potassium was 3.5 at the time of discharge.  Hypokalemia thought related to hydrochlorothiazide.  She will take recurrent dose of K-Dur q.d. at the time of discharge.  #4 - HISTORY OF GASTROESOPHAGEAL REFLUX DISEASE:  On Nexium.  #5 - HISTORY OF HYPERTENSION:  Good control on current medical regimen.  PLAN: 1. The patient discharged to home in stable condition. 2. Discharge medications:    a. Nexium 40 mg one p.o. q.d.    b. Hydrochlorothiazide 25 mg once daily.    c. Vioxx 25 mg one p.o. q.d.    d. E-Vista once daily.    e. (New) K-Dur 20 mEq q.d. 3. Activity:  As tolerated. 4. Special instructions:  The patient to follow up with Dr. Meade Maw.    She will come by our clinic Monday following discharge for placement of    event monitor if available; otherwise our clinic will call her when one    is available.  LABORATORY TESTS AND DATA:  CBC revealed wbc 5.7, hemoglobin 13.8, hematocrit 40, platelets 278.  Sodium 143, potassium 3.4, follow-up potassium was 3.5 (this was supplemented), chloride 102, CO2 31, glucose 92, BUN 19, creatinine 0.8.  LFTs within normal range.  First CK 115 with MB fraction of 2 and troponin I 0.08.  Second CK of 94 with MB fraction of 2.4 and troponin I of 0.10.  Third CK 93 with MB fraction of 1.7,  troponin I 0.07.  Admission EKG was normal.  PREVIOUS MEDICAL HISTORY: 1. Left breast cancer in 1995 with lumpectomy, chemotherapy and radiation    therapy. 2. Total hip replacement. 3. History of migraines. 4. GERD. DD:  10/20/00 TD:  10/21/00 Job: [id number]

## 2011-03-26 NOTE — Assessment & Plan Note (Signed)
[name]                         GASTROENTEROLOGY OFFICE NOTE   NAME:[name] [name] FLEMINGS                     MRN:          [id number]  DATE:01/17/2007                            DOB:          1946-02-11    [name] [name] is a delightful, 65 year old, white female with chronic  diarrhea. We evaluated her with colonoscopies, the last one on December 15, 2006. She has an incompetent ileocecal valve which is likely to  result in a bacterial overgrowth from reflux of the colon content into  the small bowel. [name] [name] did not respond to antibiotics. We have  been looking for Crohn's disease because of deformity of the ileocecal  valve and because her  inflammatory bowel disease markers were actually  positive for anti-OmpC antibody and anti ANCA  antibodies. She was put  on Entocort 9 mg a day 3 weeks ago with really no significant  improvement of her diarrhea. She has abdominal pain in the left middle  quadrant and right lower quadrant and on exam she is tender in those  areas. She never has constipation, she denies rectal bleeding. Her lab  test showed normal sed rate.   PHYSICAL EXAMINATION:  VITAL SIGNS:  Blood pressure 110/80, pulse 60,  weight 168 pounds.  ABDOMEN:  Shows mild tenderness in the left middle quadrant and right  lower quadrant, normal active bowel sounds, no distention, liver edge at  the costal margin, no tenderness in the right upper quadrant.   Results of the colonoscopy, colonoscopic biopsies of the terminal ileum  as well as ascending and left colon show benign colonic mucosa with no  microscopic colitis or any inflammatory changes.   IMPRESSION:  A 65 year old white female with chronic diarrhea and  positive inflammatory bowel disease markers for Crohn's disease .She has  an incompetent ileocecal valveon 2 colonoscopies  but no histological  evidence of inflammatory bowel disease. I still feel there is a high  possibility that  we may be dealing with Crohn's disease of the small  bowel.   PLAN:  1. Small bowel capsule endoscopy scheduled.  2. Discontinue Entocort at this time and Levbid since they have a      doubtful benefit.  3. Continue Questran, Prevalite 3 tablets daily.  4. Trial of discontinuation of Celebrex to rule out drug related      diarrhea. It is not well likely that Celebrex would be causing      diarrhea since diarrhea preceded the treatment with Celebrex.     [name] [name]. [name] Chance, MD  Electronically Signed    DMB/MedQ  DD: 01/17/2007  DT: 01/19/2007  Job #: [id number]   cc:   [name] [name], M.D.

## 2011-03-26 NOTE — Op Note (Signed)
[facility]  Patient:    [name], [name]                     MRN: [id number] Proc. Date: 04/05/01 Adm. 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  POSTOPERATIVE DIAGNOSES:  Right L1-2 foraminal stenosis with bulging disk and hard disk protrusion into the right L1-2 neuroforamen.  A spur at the entry point into the neuroforamen affecting the right L1 nerve root.  PROCEDURE:  Central laminectomy at L1 with excision of the L1 spinous process in the central portion of the lamina of the right aspect of the lamina.  Right L1 foraminotomy.  SURGEON:  Kerrin Champagne, M.D.  ASSISTANT:  Javier Docker, M.D.  ANESTHESIA:  GOT, Dr. Lucille Passy.  ESTIMATED BLOOD LOSS:  25 cc.  DRAINS:  None.  BRIEF CLINICAL HISTORY:  The patient is a 65 year old female with a history of a collapsing degenerative thoracolumbar scoliosis.  She has been experiencing severe right flank and right hip pain over the past half year, which has been progressively worsening to the point where she is having difficulty with prolonged standing or walking.  Basically sedentary at this point.  She has undergone selective nerve blocks, right side at L1 for findings of foraminal stenosis affecting the right side L1 nerve root.  This is in the concave portion of the scoliosis curve.  After discussion preoperatively, I have explained to the patient that a long segment fusion may be the only course to prevent recurrence of stenosis; however, a decompression can be attempted and if this is unsuccessful, then proceed with long segment fusion which carries more risk of morbidity.  She understands this and elected to proceed with the small procedure in attempts at alleviating pain and discomfort and improving her function.  INTRAOPERATIVE  FINDINGS:  A severe right L1 nerve root compression associated with foraminal stenosis due to both disk bulge and spurs off of the end plate on the right side at L1-2 and a spur off of the right L1-2 facet affecting the L1 nerve root at the entry point into the neuroforamen.  DESCRIPTION OF PROCEDURE:  After adequate general anesthesia, the patient in a prone position, using the Wilson frame, she does have bilateral total hip replacements.  Care in transfers both to and from the operating room table. Standard preoperative antibiotics.  Standard prep with DuraPrep solution from the mid thoracic level to the lumbosacral junction.  Draped in the usual manner.  Needle was placed for localization, and intraoperative radiograph obtained which demonstrated the needles at the L2 level and L3 level. Incision made above these needles and clamp placed over the spinous process of L1, and a second intraoperative radiograph demonstrated clamp on the L1 spinous process.  Note that the incision was infiltrated with Marcaine 0.5% with 1:200,000 epinephrine, as were the subcutaneous layers and the fascial layers.  Electrocautery was then used to incise the lumbodorsal fascia on either side of the L1 spinous process, carried down to the upper half of the L2 spinous process in the lower portion of T12.  Two Cobbs were then used to elevate the paralumbar muscles off the posterior aspect of the spinous process on the left side and the posterior  aspect of the spinous process and lamina on the right side, preserving the facet capsule on the right side.  McCullough retractor was inserted.  Spinous process of L1 was marked using a Baer rongeur for continued identification throughout the case.  Leksell rongeur was then used to remove in its entirety the spinous process of L1 up to the upper margin of L1, T12 and at the L1L2 level.  The posterior aspect of the lamina of L1 was then carefully thinned using  the Leksell rongeur as well in the midline to the right and left sides.  A 3 mm Kerrison could then be introduced in the central laminectomy and then performed, removing the inferior two-thirds of the lamina and then carrying this to the right side.  Pars interarticularis area was identified, and at least 4 mm of pars preserved with resection of the lamina medial to this area. Partial medial facetectomy was performed, removing about 30% of the medial facet on the right side at the L1-2 and foraminotomy performed over the right L2 nerve root, using a 3 mm Kerrison, removing superior lamina along the right side at L2, over the neuroforamen, decompressing the right L2 nerve root. High speed bur was used to carefully thin the medial aspect of the facet on the right side at L1-2 to allow for the resection of the 30% medially.  Undercutting the facet and the lamina on the right side and then decompression of the right lateral thecal sac was carried out, and very large spurs were found to be present adjacent to the right L1-2 facet and pressing on the right L1 nerve root at its entry point into the neuroforamen, and this was combined with spur as well as ligamentum flavum.  When decompression had been carried out, the pedicle of the L2 was identified inferior aspect of the laminotomy area.  The upper end of the pedicle identifying the disk space, Penfield 4 introduced and used to carefully free up the right-sided thecal sac and retract it medially, using a DErrico.  A Penfield 4 could then be used to probe the disk on the right side, and it was found to be protruding and bulging but not ruptured in the sense of disk material extending through the posterior aspect of the disk, none was noted. Additionally, the right neuroforamen was further decompressed by undercutting the right L1-2 facet, using 3 mm Kerrisons, such that a hockey stick probe could be passed easily out the right L1-2 neuroforamen  without difficulty, demonstrating the right L1 nerve root to be well decompressed.  Exploration of the disk laterally demonstrated no free disk material within the neuroforamen or out lateral to the disk space.   Irrigation was then performed.  The disk, although it was felt to be bulging on the right side was not felt to be causing significant compression on the thecal sac following the central and right-sided decompression and because of the patients scoliosis, it was felt that the removal of disk would be a detriment and most likely would cause recurrent foraminal narrowing.  With that in mind, then a decompression was adequate at this time.  Hemostasis obtained by placing bone wax over the bleeding cancellous bone surfaces and then removing excess bone wax.  Gelfoam was then placed over the laminotomy site on the right side.  The paralumbar muscles were approximated in the midline with interrupted 0 Vicryl sutures.  The lumbodorsal fascia reapproximated in the midline with interrupted #1 Vicryl sutures.  Deep subcutaneous layers reapproximated  with interrupted 0 Vicryl sutures.  More superficial layers with interrupted 2-0 Vicryl sutures and the skin closed with a running subcutaneous stitch of 4-0 Monocryl.  Tincture of Benzoin and Steri-Strips applied.  A coverlet dressing than applied.  The patient was then returned to the supine position, extubated and returned to the recovery room in satisfactory condition.  Note, that an iodine impregnated Vi-drape was used after removal of spinal needles at the beginning of the case.  The patient did receive preoperative antibiotics of Ancef.  There was no surgical specimen. All instrument and sponge counts were correct. DD:  04/05/01 TD:  04/05/01 Job: [id number]

## 2011-03-26 NOTE — H&P (Signed)
[place]. [facility]  Patient:    MICALA, SALTSMAN                     MRN: [id number] Adm. Date:  [id number] Disc. Date: [id number] Attending:  Lyn Records. Iii Dictator:   Anselm Lis, N.P. CC:         Meade Maw, M.D.  Julieanne Manson, M.D.   History and Physical  DATE OF BIRTH:  [birth year]  OBJECTIVE:  Ms. Dick is a pleasant 65 year old female with a history of breast cancer, history of left anterior chest discomfort for whom a follow-up outpatient Cardiolite was negative for ischemia.  Her ejection fraction was 67%.  Her left chest pressure (which had radiated to the left arm, back, and left shoulder) resolved after about a week of Nexium treatment, so the thought was this was all related to GERD.  She has no recurrence of the same.  The patient has been feeling a lot of stress over the last week with busy holidays, finishing up a long project at work.  Today, after finishing a project at work, she experienced an episode of nausea, diaphoresis, and her heart raced as well as feeling very dizzy.  All of this lasted approximately one hour and was eventually relieved with relaxation and rest.  She has had two subsequent recurrent episodes; the second episode with the same constellation of symptoms but now with a right and mid/lower sternal pressure described as viselike.  This lasted about 15 to 20 minutes.  She had a third episode with similar symptoms but without the sensation of heart racing. This lasted a few minutes.  She had recurrence of chest discomfort while en route by EMS, which seemed relieved with sublingual nitrate.  Heart rate was about 100 at that point.  EKG reveals NSR without ischemic changes.  She is currently pain-free.  PREVIOUS MEDICAL HISTORY: 1. Left breast cancer in 1995, with subsequent lumpectomy, chemotherapy for    six months, radiation therapy for 26 treatments (Dr. Cleone Slim). 2. Total hip replacement x  2 on the right and one time on the left. 3. Arthritis followed by Dr. Otelia Sergeant. 4. Migraine headaches followed by Dr. Sandria Manly.  Manifest as visual changes. 5. GERD manifesting as left anterior chest pressure radiating to back and left    shoulderm, resolved after about a weeks treatment of Nexium. 6. History of hypertension for a year and a half. 7. History of chest discomfort (see #5). Chest Cardiolite by Dr. Fraser Din    about a month earlier was negative for ischemia with EF of 67%.  ALLERGIES:  SULFA causing rash.  MEDICATIONS:  Nexium 40 mg p.o. q d., hydrochlorothiazide 25 mg p.o. q.d., Vioxx 25 mg p.o. q.d., Evista once daily.  SOCIAL HABITS:  Tobacco: Negative.  ETOH:  Rare.  Caffeine:  Negative. 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   She has been married for 15 years (2nd marriage), has a daughter and a son ages 64 and 66, respectively, alive and well.  Her daughter is a Engineer, civil (consulting).  FAMILY HISTORY:  Mother died at age 1 of breast cancer, father died at age 54 of colon cancer; had three mild cardiac infarctions, first at age 24.  She has a sister deceased at age 50 of multiple sclerosis, brother age 35, sister age 74, and another sister age 71, all alive and well though some have hypertension.  REVIEW OF SYSTEMS:  As HPI/Past Medical History.  Otherwise, denies prior episodes of lightheadedness, dizziness, syncopal nor near syncopal episodes. Denies prior history of palpitations.  Slight dysphagia, episodic.  History of GERD as per HPI.  Negative melena, bright blood red per rectum, constipation or diarrhea.  No dysuria nor hematuria.  Arthritic-type complaints affecting her lower back.  Negative orthopnea, PND, nor DOE.  Denies pedal edema though has had some left ankle swelling status post ankle sprain a week or so ago.  PHYSICAL EXAMNINATION:  VITAL SIGNS:  Blood pressure 106/63, with heart rate 96 and regular, respiratory rate 20, temperature 98.7, O2 saturation  96% on room air.  GENERAL:  She is a well-nourished, middle-aged female in no acute distress. Pleasant, well groomed.  HEENT:  Brisk bilateral carotid upstroke without bruit.  No significant JVD. No thyromegaly.  CHEST:  Lung sounds clear with equal bilateral excursion.  Negative CPA tenderness noted.  No reproduction of right chest pressure to applied pressure.  CARDIAC:  Regular rate and rhythm without murmur, rub, nor gallop.  Normal S1 and S2.  ABDOMEN:  Soft, nondistended, normoactive bowel sounds.  Negative abdominal aortic, renal, and femoral bruit.  Nontender to applied pressure; no masses, no organomegaly appreciated.  EXTREMITIES:  +2/4 bilateral radial, femoral, dorsalis pedis, and posterior tibial pulses. Negative pedal edema.  NEUROLOGIC:  Cranial nerves II-XII grossly intact; alert and oriented x 3.  GENITORECTAL:  Deferred.  LABORATORY TESTS:  CMET, CBC, troponin I, CK/MB, TSH were drawn and are pending.  Chest x-ray revealed right lower lobe atelectasis, otherwise clear.  IMPRESSION:  Constellation of symptoms including lightheadedness, dizziness, diaphoresis with some episodes associated with atypical (right of sternal) chest pressure, in the setting of palpitations.  Doubt ischemic etiology in view of recent negative stress Cardiolite within the last month though risk factors include positive family history (dad with MI age 9).  PLAN:  Will follow up on cardiac enzymes.  If CK/MB or troponin I are positive, we will maintain in hospital for further evaluation.  If negative, will discharge to home.  Patient will call our clinic for placement of event monitor to ascertain if episodes associated with arrhythmia (paroxysmal atrial fibrillation/flutter). DD:  10/07/00 TD:  10/07/00 Job: [id number]

## 2011-03-26 NOTE — Discharge Summary (Signed)
NAME:  [name], [name]                        ACCOUNT NO.:  [id number]   MEDICAL RECORD NO.:  [id number]                   PATIENT TYPE:  INP   LOCATION:  5017                                 FACILITY:  [facility]   PHYSICIAN:  Sharolyn Douglas, M.D.                     DATE OF BIRTH:  1946/09/17   DATE OF ADMISSION:  04/22/2004  DATE OF DISCHARGE:  04/28/2004                                 DISCHARGE SUMMARY   ADMISSION DIAGNOSES:  1. Severe degenerative scoliosis.  2. Hypertension.  3. History of breast cancer.  4. History of deep venous thrombosis.   DISCHARGE DIAGNOSES:  1. Status post thoracolumbar fusion for scoliosis with pedicle screws, hooks     and rods.  2. Postoperative anemia that required a blood transfusion.  3. Postoperative temperature that improved prior to discharge.  4. Hypertension.  5. History of breast cancer.  6. History of deep venous thrombosis.   CONSULTATIONS:  None.   LABORATORY DATA:  EKG on April 16, 2004 showed normal sinus rhythm  unconfirmed.  Portable x-rays were used intraoperatively on June 15, for  localization on June 21 showed a status post fusion from T4 to L4 scoliosis.  CBC preoperatively was normal.  Postoperatively H&H was monitored and did  reach a low of 9 and 26.3.  She was having mild symptoms; therefore, she was  transfused one unit packed red blood cells at that time.  She responded  nicely the following day, up to 10.2 and 29.1.  PT, INR, PTT preoperatively  were normal.  Complete metabolic panel preoperatively was normal except for  glucose of 101.  Basic metabolic panel was monitored postoperatively.  She  developed hypokalemia to a low of 2.9 on June 16.  Potassium supplement was  added and she did increase back up to 3.2 prior to discharge.  Glucose was  elevated slightly, ranging from 101 to 162 throughout first days  postoperatively and did return to normal of 94 prior to discharge.  Urinalysis preoperatively was negative with the  exception of a few  epithelials.  Blood type from June 9 showed blood type O positive, antibody  screen negative.   BRIEF HISTORY:  The patient is a 65 year old female who had increasing  scoliosis, increasing back pain.  Given the severity of the scoliosis, it  was thought that it was likely to continue to progress as she aged.  After a  long review of her symptoms as well as a question of her curve and  discussion with the patient, she was interested in pursuing ___________  fusion procedure to prevent any future problems and collapse of her  scoliosis secondary to this as well as her increasing curvature of her back.  It was felt that her best course of management would be the long posterior  spinal fusion.  Dr. Noel [name] did discuss the risks and benefits of this  procedure  with the patient.  She also did have the opportunity to speak with  previous patients who underwent similar procedures.  After risks and  benefits discussion by Dr. Noel [name] as well as myself, she and her family  indicated an understanding and opted to proceed with the surgery.   HOSPITAL COURSE:  On April 22, 2004, the patient was taken to the operating  room for the above procedure.  She tolerated the procedure well without any  intraoperative complications.  One Hemovac drain was placed. There was  approximately 600 mL of blood loss with good return via Cell Saver.  There  was no intraoperative complications.  She was transferred to the recovery  room in stable condition.   Postoperatively routine orthopedic spine protocol was followed.  She was put  in the intensive care unit for the first two days for monitoring secondary  to her extensive surgery.  However, she did extremely well and did not  develop any medical complications or issues.  She was held NPO with the  exception of ice chips and sips with her medications until she passed flatus  at which time her diet was slowly advanced.  She did well with this.   Pain  control was obtained utilizing a combination of PCA as well as p.o.  analgesics.  She did have difficulty with pain control from postoperative  day #1.  However, by postoperative day #2, her pain control was much  improved.  She was able to wean off the PCA by postoperative day #3 and  strictly on p.o. analgesics.  Her H&H did reach a low of 9 and 26.3 on June  17.  She was transfused one unit of packed red blood cells at that time  secondary to being mildly symptomatic.  She was also having some mild  hypokalemia at that time and, therefore, p.o. potassium was added and her  potassium did increase back up prior to discharge.  By June 18, she had  passed flatus as well as bowel movement.  Diet was advanced and she  tolerated that well.  Prophylaxis antibiotics were used postoperatively.  She did develop a low-grade temperature on April 27, 2004.  However, with  increased mobilization and use of incentive spirometer, this did improve.  By April 28, 2004, the patient was doing extremely well.  She had met all  orthopedic goals.  She was independent.  She understood her back  precautions.  She was able to use her brace without assistance.  She was  also medically stable and ready for discharge to her home.   DISCHARGE PLAN:  The patient is a 65 year old female who is status post  scoliosis fusion from T4 to L4, doing extremely well.   1. Daily progressive ambulation with brace on when she is up.  Back     precautions at all times.  No lifting heavier than five pounds.  2. Dressing changes to the back daily.  3. Keep the incision dry for five days.  4. Follow up in two weeks postoperatively with Dr. Noel [name].  Call for an     appointment.   DISCHARGE MEDICATIONS:  1. Percocet for pain.  2. Robaxin for muscle spasm.  3. Multivitamins daily.  4. Calcium daily.  5. Laxative as needed.  6. Avoid NSAIDS x 3 months.  7. Resume regular home medications as well.   DIET:  Regular home  diet.  CONDITION ON DISCHARGE:  Stable and improved.   DISPOSITION:  The patient will  be discharged to her home with her family's  assistance as well as home health physical therapy and occupational therapy.      Verlin Fester, P.A.                       Sharolyn Douglas, M.D.    CM/MEDQ  D:  05/20/2004  T:  05/20/2004  Job:  [id number]

## 2011-03-26 NOTE — Op Note (Signed)
NAME:  [name], [name]                        ACCOUNT NO.:  [id number]   MEDICAL RECORD NO.:  [id number]                   PATIENT TYPE:  INP   LOCATION:  2109                                 FACILITY:  [facility]   PHYSICIAN:  Sharolyn Douglas, M.D.                     DATE OF BIRTH:  1946-06-29   DATE OF PROCEDURE:  04/22/2004  DATE OF DISCHARGE:                                 OPERATIVE REPORT   DIAGNOSIS:  Degenerative thoracolumbar kyphoscoliosis.   PROCEDURE:  1. Posterior spinal fusion, T4-L4.  2. Trans-foraminal lumbar interbody fusion, L3-L4, with placement of 8-mm     PEK prosthetic cage.  3. Left posterior iliac crest bone graft taken through a separate incision.  4. Neuromonitoring of free running EMG's x 4 hours and testing of seven     pedicle screws with triggered EMG's.   SURGEON:  Sharolyn Douglas, M.D.   ASSISTANT:  __________ .   ANESTHESIA:  General endotracheal.   COMPLICATIONS:  None.   INDICATIONS:  The patient is a very pleasant 65 year old female with chronic  back and right greater than left leg pain.  She is status post L1  laminectomy, 2002, by Dr. Otelia Sergeant.  Radiographs demonstrate a severe  degenerative kyphoscoliosis measures 36 degrees to the left from T11-L4.  There is rotatory subluxation.  MRI scan and CT myelogram demonstrate  varying degrees of lateral recess and foraminal narrowing.  She has had  documented progression of the curve.  Refractory to all conservative  treatment modalities.  She has elected to undergo fusion and stabilization  of her curve in hopes of preventing further progression and potentially  helping with her pain.  She knows there are no guarantees.  Risks and  benefits extensively reviewed.  We initially discussed extending the fusion  down to L5.  Intraoperatively, the L4-L5 joint appeared well preserved and  it was felt that the fusion could be stopped at L4.  This would save her two  lower levels in hopes of preventing early adjacent  segment failure.   PROCEDURE:  The patient was properly identified in the holding area, taken  to the operating room.  She underwent general endotracheal anesthesia  without difficulty.  Given prophylactic IV antibiotics.  EMG leads were  placed for monitoring of triggered and free running EMG's using the neuro-  vision system.  She was carefully positioned prone on the [employer] four  poster positioning frame.  All bony prominences were padded.  Her face and  eye protected at all times.  The back was prepped and draped in the usual  sterile fashion.  Midline incision made from T4 down to L5.  Dissection was  carried sharply through the deep fascia.  The paraspinal muscles were  elevated out to the tips of the transverse processes T4-L4.  Great care was  taken over the L1-L2 interspace where she had a previous laminectomy.  We  found the facette joints to be severely degenerative within the  thoracolumbar curve.  The L4-L5 facette joints were relatively preserved.  The L4-L5 inner space appeared to be relatively horizontal to the pelvis,  and it was felt that the fusion could be stopped at L4, reducing the chance  of early adjacent segment failure by preserving two lower lumbar disks.  The  spinous processes were removed using a Horsley Rongeur .  The ligament  between L4-L5 and L3-L4 preserved.  Isatin used to complete facetectomy at  each level.  We then turned out attention to placing instrumentation.  Downgoing transverse process hooks were placed bilaterally at T4.  Upgoing  pedicle hook placed on the left at T4 and on the right at T5.  Downgoing  transverse process hook placed on left at T6 and on the right at T7.  Upgoing pedicle hook on the left at T7 and on the right at T8.  Upgoing  pedicle hook placed on the left at T10.  We then turned our attention to  placing pedicle screws using an anatomic probing technique.  Each pedicle  entry point was identified.  Pedicel aul used to  initiate the pedicle  starting hole.  The pedicle probe was then used to cannulate the pedicle  which pedicle was then palpated with the blunt probe and there were no  identifiable breaches.  Free running EMG's were monitored.  No deleterious  changes after each pedicle screw was placed.  The screw was stimulated using  triggered EMG's and again there were no deleterious changes.  On the left  side, 6.5 x 30-mm screw placed at T12, a 6.5 x 40-mm screw placed at L1, 6.5  x 45-mm screw placed at L2, and 6.5 x 35-mm screw placed at L3, a 7.5 x 40-  mm screw placed on the left at L4.  A 6.5 x 35-mm screw right L3 and 7.5 x  40-mm screw right L4.  Bone quality and screw purchase were acceptable  considering the patient's age and sex.  We then turned our attention to  performing a left posterior iliac crest bone graft through a separate  incision.  A 5-cm incision made directly over the posterior superior iliac  spine.  Dissection was carried through the deep fascia.  The outer table of  the ilium was exposed.  The outer cortex removed and gouges used to remove  copious amounts of cancellous bone from the ilium.  The wound was irrigated.  Deep fascia closed with 1-0 Vicryl, subcutaneous layer closed with 2-0  Vicryl, following by running 3-0 subcuticular suture on the skin.  Benzoin  Steri-Strips placed.  We then turned our attention back to the midline  incision.  Trans-foraminal lumbar interbody fusion carried out at L3-L4 on  the left side.  The inferior facette of L3 was osteotomized.  Superior  facette of L4 cut flush with the pedicle.  The lateral border of the thecal  sac as well as the exiting and transverse nerve roots were identified.  The  disk space was identified and the trans-foraminal window.  Sharp annulotomy  carried out.  We then dilated the disk space stop using intervertebral  distractor's to 8-mm.  We found the inner space to be degenerative.  There was very little disk  material, cartilaginous end plates were scraped using  TF __________ across the contralateral side.  The inner space was then  packed with the iliac crest bone graft.  An 8-mm PEK  prosthetic cage  inserted into the inner space and camped anteriorly.  We then turned our  attention to completing the posterior spinal arthrodesis by decorticating  the facette joint transverse processes and residual lamina at each level.  The remaining posterior iliac crest bone graft as well as local autogenous  bone graft collected from the spinous processes which had been cleaned and  morselized are packed into the lateral gutters and into the facette joints  at each level.  Titanium rods were then cut to length and bent into thoracic  kyphosis and lordosis.  The rods were seated into the polyaxial screw heads  distally and hooked proximally.  The hook claw constructs were seated and  compressed at each level.  After the rods were in place in situ bending was  carried out in attempt to improve the lumbar lordosis.  The left L4 pedicle  screw stripped out during the in situ bending process.  The rod was removed  and the screw examined.  It was not providing any significant fixation and  was removed.  Consideration was given to inserting an 8.5-mm screw; however,  this also did not achieve any fixation and it was elected to leave the  distal screw out on the left side.  However, this also did not provide  adequate fixation.  The screw was, therefore, removed.  The rod was placed  on the left side ending at L3.  The rod was then reinserted on the left  ending at L3.  Compression was applied across the L3-L4 segment on the right  side before securing the locking caps.  Intraoperative lateral x-rays showed  adequate positioning of the screws and interbody graft.  The wound was  irrigated.  Deep fascia closed with a running #1 Vicryl, subcutaneous layer  closed with interrupted 2-0 Vicryl, followed by a running 3-0  subcuticular  Vicryl suture to approximate the skin edges.  Benzoin Steri-Strips placed.  A superficial drain was left.  Sterile dressing applied.  The patient was  turned supine and extubated without difficulty.  She was transferred to the  recovery room in stable condition, able to move her upper and lower  extremities.                                               Sharolyn Douglas, M.D.    MC/MEDQ  D:  04/23/2004  T:  04/24/2004  Job:  [id number]

## 2011-03-26 NOTE — Op Note (Signed)
NAME:  [name], [name]                        ACCOUNT NO.:  [id number]   MEDICAL RECORD NO.:  [id number]                   PATIENT TYPE:  INP   LOCATION:  2109                                 FACILITY:  [facility]   PHYSICIAN:  Sharolyn Douglas, M.D.                     DATE OF BIRTH:  04/26/1946   DATE OF PROCEDURE:  04/23/2004  DATE OF DISCHARGE:                                 OPERATIVE REPORT   ADDENDUM:  This is an addendum to the April 22, 2004, operative report.  The  local autogenous bone graft and posterior iliac crest bone graft were  supplemented with 40 mL of allograft cancellous chips.                                               Sharolyn Douglas, M.D.    MC/MEDQ  D:  04/23/2004  T:  04/24/2004  Job:  [id number]

## 2011-03-26 NOTE — H&P (Signed)
NAMEKELLYANNE, ELLWANGER                          ACCOUNT NO.:  [id number]   MEDICAL RECORD NO.:  [id number]                    PATIENT TYPE:   LOCATION:                                       FACILITY:   PHYSICIAN:  Marlowe Kays, M.D.               DATE OF BIRTH:   DATE OF ADMISSION:  03/26/2003  DATE OF DISCHARGE:                                HISTORY & PHYSICAL   CHIEF COMPLAINT:  Left shoulder pain.   HISTORY OF PRESENT ILLNESS:  The patient is a 65 year old female with a long  history of right shoulder pain.  She has been a longstanding patient of Dr.  Simonne Come.  She has previously undergone a left shoulder arthroscopy back in  2000.  She presents today with increased pain in her left shoulder.  She  states that in the Winter it is a whole lot worse than during any other  months.  However, after this past Winter she has stated that she would like  to have something done surgically due to the increased amount of pain.  X-  rays of her shoulder reveal flattening and eburnation of the glenoid with  bone-on-bone contact of the humeral head to the glenoid with inferior  spurring.  The risks and benefits of the procedure have been discussed with  the patient and the patient wishes to proceed.   PAST MEDICAL HISTORY:  1. Hypertension.  2. Osteoporosis.  3. Osteoarthritis.  4. Breast cancer.  5. Gastroesophageal reflux disease.  6. Hiatal hernia.   PAST SURGICAL HISTORY:  1. Cholecystectomy.  2. Lumbar laminectomy.  3. Left shoulder arthroscopy.  4. Left bunionectomy and removal of Morton neuroma.  5. Right total hip arthroplasty.  6. Right acetabular revision.  7. Left total hip arthroplasty.   MEDICATIONS:  1. Evista 60 mg one p.o. daily.  2. Bextra 20 mg one p.o. daily.  3. HCTZ 25 mg one p.o. daily.  4. Potassium 20 mEq one p.o. daily.  5. Darvocet-N 100 one p.o. q.4-6 h. p.r.n. pain.   ALLERGIES:  SULFA causes itching and rash, DEMEROL causes itching and rash,  TYLOX causes itching and rash, and TALWIN causes hallucinations.   SOCIAL HISTORY:  The patient denies any tobacco use, rare alcohol usage.  She is married.  She lives in a two-story house with 16 steps.  Her husband  will be her caregiver after surgery.   FAMILY HISTORY:  Mother breast cancer and osteoarthritis.  Father colon  cancer.   REVIEW OF SYSTEMS:  GENERAL:  Denies fever, chills, night sweats, bleeding  tendencies.  CENTRAL NERVOUS SYSTEM:  Positive headaches or migraines one  per month.  Denies blurry, double vision, seizures, paralysis.  RESPIRATORY:  Denies shortness of breath, productive cough, hemoptysis.  CARDIOVASCULAR:  Denies chest pain, angina, orthopnea.  GI:  Denies nausea, vomiting,  diarrhea, constipation, melena, bloody stools.  GU:  Denies dysuria,  hematuria, or  discharge.  MUSCULOSKELETAL:  Positive numbness and tingling  in bilateral upper and lower extremities.   PHYSICAL EXAMINATION:  VITAL SIGNS:  Blood pressure 100/70, pulse 84,  respirations 12.  GENERAL:  A well-developed, well-nourished 65 year old female.  HEENT:  Normocephalic, atraumatic.  Pupils equal, round, reactive to light.  NECK:  Supple.  No carotid bruit noted.  CHEST:  Clear to auscultation bilaterally.  No wheezes or crackles.  HEART:  Regular rate and rhythm.  No murmurs, rubs, or gallops.  ABDOMEN:  Soft, nontender, nondistended.  Positive bowel sounds x4.  EXTREMITIES:  Decreased range of motion to the left shoulder, abduction to  90 degrees, flexion to 90 degrees, internal rotation to 90 degrees, and  external rotation to 90 degrees.  She has decreased strength globally in her  left shoulder and positive crepitance on range of motion.  SKIN:  No rashes or lesion.   RADIOLOGIC DATA:  X-rays reveal flattening and eburnation of the glenoid  with bone-on-bone contact with an inferior spur of the left shoulder.   IMPRESSION:  1. Osteoarthritis left shoulder.  2. Hypertension.  3.  Osteoporosis.  4. Osteoarthritis.  5. Breast cancer.  6. Gastroesophageal reflux disease.  7. Hiatal hernia.   PLAN:  The patient will be admitted to the [facility] on Mar 26, 2003 and undergo a left shoulder hemiarthroplasty versus total shoulder  arthroplasty by Marlowe Kays, M.D.     Clarene Reamer, P.A.-C.                   Marlowe Kays, M.D.    SW/MEDQ  D:  03/19/2003  T:  03/19/2003  Job:  [id number]

## 2011-05-27 ENCOUNTER — Other Ambulatory Visit: Payer: Self-pay | Admitting: Orthopaedic Surgery

## 2011-05-27 DIAGNOSIS — M542 Cervicalgia: Secondary | ICD-10-CM

## 2011-05-31 ENCOUNTER — Ambulatory Visit
Admission: RE | Admit: 2011-05-31 | Discharge: 2011-05-31 | Disposition: A | Discharge status: Home / Self Care | Payer: Medicare Other | Source: Ambulatory Visit | Attending: Orthopaedic Surgery | Admitting: Orthopaedic Surgery

## 2011-05-31 DIAGNOSIS — M542 Cervicalgia: Secondary | ICD-10-CM

## 2011-08-20 LAB — BASIC METABOLIC PANEL
CO2: 30
Calcium: 8.1 — ABNORMAL LOW
Creatinine, Ser: 0.57
GFR calc Af Amer: >60

## 2011-08-20 LAB — HEMOGLOBIN AND HEMATOCRIT, BLOOD: HCT: 32.5 — ABNORMAL LOW

## 2011-08-23 LAB — URINALYSIS, ROUTINE W REFLEX MICROSCOPIC
Ketones, ur: NEGATIVE
Protein, ur: NEGATIVE
Urobilinogen, UA: 0.2

## 2011-08-23 LAB — HEMOGLOBIN AND HEMATOCRIT, BLOOD: HCT: 36.1

## 2011-08-23 LAB — URINE CULTURE

## 2011-08-23 LAB — COMPREHENSIVE METABOLIC PANEL
AST: 22
BUN: 13
CO2: 29
Calcium: 10.3
Creatinine, Ser: 0.7
GFR calc Af Amer: >60
GFR calc non Af Amer: >60
Glucose, Bld: 95

## 2011-08-23 LAB — BASIC METABOLIC PANEL
CO2: 28
Chloride: 105
GFR calc Af Amer: >60
Sodium: 140

## 2011-08-23 LAB — PROTIME-INR
INR: 0.9
Prothrombin Time: 12.4

## 2011-08-23 LAB — CBC
HCT: 45
MCHC: 34
MCV: 91.7
RBC: 4.91

## 2011-08-23 LAB — DIFFERENTIAL
Basophils Absolute: 0
Lymphocytes Relative: 39
Lymphs Abs: 1.7
Neutro Abs: 2.2
Neutrophils Relative %: 51

## 2011-08-23 LAB — TYPE AND SCREEN: ABO/RH(D): O POS

## 2011-09-06 ENCOUNTER — Other Ambulatory Visit: Payer: Self-pay | Admitting: Obstetrics & Gynecology

## 2011-09-06 DIAGNOSIS — Z1231 Encounter for screening mammogram for malignant neoplasm of breast: Secondary | ICD-10-CM

## 2011-09-29 ENCOUNTER — Ambulatory Visit
Admission: RE | Admit: 2011-09-29 | Discharge: 2011-09-29 | Disposition: A | Discharge status: Home / Self Care | Payer: Medicare Other | Source: Ambulatory Visit | Attending: Obstetrics & Gynecology | Admitting: Obstetrics & Gynecology

## 2011-09-29 DIAGNOSIS — Z1231 Encounter for screening mammogram for malignant neoplasm of breast: Secondary | ICD-10-CM

## 2011-10-21 ENCOUNTER — Other Ambulatory Visit: Payer: Self-pay | Admitting: Obstetrics & Gynecology

## 2011-10-21 DIAGNOSIS — N644 Mastodynia: Secondary | ICD-10-CM

## 2011-11-05 ENCOUNTER — Ambulatory Visit
Admission: RE | Admit: 2011-11-05 | Discharge: 2011-11-05 | Disposition: A | Discharge status: Home / Self Care | Payer: Medicare Other | Source: Ambulatory Visit | Attending: Obstetrics & Gynecology | Admitting: Obstetrics & Gynecology

## 2011-11-05 ENCOUNTER — Other Ambulatory Visit: Payer: Self-pay | Admitting: Obstetrics & Gynecology

## 2011-11-05 DIAGNOSIS — N644 Mastodynia: Secondary | ICD-10-CM

## 2012-09-07 ENCOUNTER — Other Ambulatory Visit: Payer: Self-pay | Admitting: Obstetrics & Gynecology

## 2012-09-07 DIAGNOSIS — Z1231 Encounter for screening mammogram for malignant neoplasm of breast: Secondary | ICD-10-CM

## 2012-10-23 ENCOUNTER — Ambulatory Visit
Admission: RE | Admit: 2012-10-23 | Discharge: 2012-10-23 | Disposition: A | Discharge status: Home / Self Care | Payer: Medicare Other | Source: Ambulatory Visit | Attending: Obstetrics & Gynecology | Admitting: Obstetrics & Gynecology

## 2012-10-23 DIAGNOSIS — Z1231 Encounter for screening mammogram for malignant neoplasm of breast: Secondary | ICD-10-CM

## 2013-01-31 ENCOUNTER — Other Ambulatory Visit: Payer: Self-pay | Admitting: Obstetrics & Gynecology

## 2013-01-31 DIAGNOSIS — Z780 Asymptomatic menopausal state: Secondary | ICD-10-CM

## 2013-02-09 ENCOUNTER — Ambulatory Visit
Admission: RE | Admit: 2013-02-09 | Discharge: 2013-02-09 | Disposition: A | Discharge status: Home / Self Care | Payer: Medicare Other | Source: Ambulatory Visit | Attending: Obstetrics & Gynecology | Admitting: Obstetrics & Gynecology

## 2013-02-09 DIAGNOSIS — Z780 Asymptomatic menopausal state: Secondary | ICD-10-CM

## 2013-03-14 ENCOUNTER — Other Ambulatory Visit (HOSPITAL_COMMUNITY): Payer: Self-pay | Admitting: Orthopedic Surgery

## 2013-03-14 DIAGNOSIS — M25551 Pain in right hip: Secondary | ICD-10-CM

## 2013-03-21 ENCOUNTER — Encounter (HOSPITAL_COMMUNITY)
Admission: RE | Admit: 2013-03-21 | Discharge: 2013-03-21 | Disposition: A | Discharge status: Home / Self Care | Payer: Medicare Other | Source: Ambulatory Visit | Attending: Orthopedic Surgery | Admitting: Orthopedic Surgery

## 2013-03-21 DIAGNOSIS — M25551 Pain in right hip: Secondary | ICD-10-CM

## 2013-03-21 DIAGNOSIS — M25559 Pain in unspecified hip: Secondary | ICD-10-CM | POA: Insufficient documentation

## 2013-03-21 MED ORDER — TECHNETIUM TC 99M MEDRONATE IV KIT
25.0000 | PACK | Freq: Once | INTRAVENOUS | Status: AC | PRN
  Administered 2013-03-21: 25 via INTRAVENOUS

## 2013-03-23 ENCOUNTER — Ambulatory Visit (HOSPITAL_COMMUNITY): Payer: Medicare Other

## 2013-03-23 ENCOUNTER — Encounter (HOSPITAL_COMMUNITY): Payer: Medicare Other

## 2013-04-10 ENCOUNTER — Other Ambulatory Visit: Payer: Self-pay | Admitting: Orthopaedic Surgery

## 2013-04-10 DIAGNOSIS — M545 Low back pain: Secondary | ICD-10-CM

## 2013-04-17 ENCOUNTER — Ambulatory Visit
Admission: RE | Admit: 2013-04-17 | Discharge: 2013-04-17 | Disposition: A | Discharge status: Home / Self Care | Payer: Medicare Other | Source: Ambulatory Visit | Attending: Orthopaedic Surgery | Admitting: Orthopaedic Surgery

## 2013-04-17 VITALS — BP 135/70 | HR 75 | Ht 60.0 in | Wt 170.0 lb

## 2013-04-17 DIAGNOSIS — M545 Low back pain, unspecified: Secondary | ICD-10-CM

## 2013-04-17 MED ORDER — ONDANSETRON HCL 4 MG/2ML IJ SOLN
4.0000 mg | Freq: Once | INTRAMUSCULAR | Status: AC
  Administered 2013-04-17: 4 mg via INTRAMUSCULAR

## 2013-04-17 MED ORDER — IOHEXOL 300 MG/ML  SOLN
10.0000 mL | Freq: Once | INTRAMUSCULAR | Status: AC | PRN
Start: 2013-04-17 — End: 2013-04-17
  Administered 2013-04-17: 10 mL via INTRATHECAL

## 2013-04-17 MED ORDER — MORPHINE SULFATE 4 MG/ML IJ SOLN
5.0000 mg | Freq: Once | INTRAMUSCULAR | Status: AC
  Administered 2013-04-17: 5 mg via INTRAMUSCULAR

## 2013-04-17 MED ORDER — DIAZEPAM 5 MG PO TABS
5.0000 mg | ORAL_TABLET | Freq: Once | ORAL | Status: AC
  Administered 2013-04-17: 5 mg via ORAL

## 2013-06-13 ENCOUNTER — Other Ambulatory Visit: Payer: Self-pay

## 2013-08-03 ENCOUNTER — Encounter (HOSPITAL_COMMUNITY): Payer: Self-pay | Admitting: Pharmacist

## 2013-08-08 ENCOUNTER — Encounter (HOSPITAL_COMMUNITY)
Admission: RE | Admit: 2013-08-08 | Discharge: 2013-08-08 | Disposition: A | Discharge status: Home / Self Care | Payer: Medicare Other | Source: Ambulatory Visit | Attending: Obstetrics & Gynecology | Admitting: Obstetrics & Gynecology

## 2013-08-08 ENCOUNTER — Encounter (HOSPITAL_COMMUNITY): Payer: Self-pay

## 2013-08-08 DIAGNOSIS — Z01818 Encounter for other preprocedural examination: Secondary | ICD-10-CM | POA: Insufficient documentation

## 2013-08-08 DIAGNOSIS — Z01810 Encounter for preprocedural cardiovascular examination: Secondary | ICD-10-CM | POA: Insufficient documentation

## 2013-08-08 DIAGNOSIS — Z01812 Encounter for preprocedural laboratory examination: Secondary | ICD-10-CM | POA: Insufficient documentation

## 2013-08-08 HISTORY — DX: Essential (primary) hypertension: I10

## 2013-08-08 HISTORY — DX: Gastro-esophageal reflux disease without esophagitis: K21.9

## 2013-08-08 HISTORY — DX: Headache: R51

## 2013-08-08 HISTORY — DX: Unspecified osteoarthritis, unspecified site: M19.90

## 2013-08-08 LAB — BASIC METABOLIC PANEL
BUN: 16 mg/dL (ref 6–23)
CO2: 31 mEq/L (ref 19–32)
Calcium: 9.8 mg/dL (ref 8.4–10.5)
Creatinine, Ser: 0.77 mg/dL (ref 0.50–1.10)
Glucose, Bld: 107 mg/dL — ABNORMAL HIGH (ref 70–99)

## 2013-08-08 LAB — CBC
HCT: 44.1 % (ref 36.0–46.0)
Hemoglobin: 14.7 g/dL (ref 12.0–15.0)
MCH: 29.6 pg (ref 26.0–34.0)
MCV: 88.9 fL (ref 78.0–100.0)
Platelets: 210 10*3/uL (ref 150–400)
RBC: 4.96 MIL/uL (ref 3.87–5.11)

## 2013-08-08 NOTE — Patient Instructions (Signed)
Your procedure is scheduled on:08/15/13  Enter through the Main Entrance at :0815 Pick up desk phone and dial [phone] and inform us of your arrival.  Please call [phone] if you have any problems the morning of surgery.  Remember: Do not eat food or drink liquids, including water, after midnight: Tuesday    You may brush your teeth the morning of surgery.  Take these meds the morning of surgery with a sip of water:Omeprazole, Potassium, Celebrex  DO NOT wear jewelry, eye make-up, lipstick,body lotion, or dark fingernail polish.  (Polished toes are ok) You may wear deodorant.  If you are to be admitted after surgery, leave suitcase in car until your room has been assigned. Patients discharged on the day of surgery will not be allowed to drive home. Wear loose fitting, comfortable clothes for your ride home.

## 2013-08-15 ENCOUNTER — Ambulatory Visit (HOSPITAL_COMMUNITY): Payer: Medicare Other | Admitting: Anesthesiology

## 2013-08-15 ENCOUNTER — Encounter (HOSPITAL_COMMUNITY)
Admission: RE | Disposition: A | Discharge status: Home / Self Care | Payer: Self-pay | Source: Ambulatory Visit | Attending: Obstetrics & Gynecology

## 2013-08-15 ENCOUNTER — Other Ambulatory Visit: Payer: Self-pay | Admitting: Obstetrics & Gynecology

## 2013-08-15 ENCOUNTER — Ambulatory Visit (HOSPITAL_COMMUNITY)
Admission: RE | Admit: 2013-08-15 | Discharge: 2013-08-15 | Disposition: A | Discharge status: Home / Self Care | Payer: Medicare Other | Source: Ambulatory Visit | Attending: Obstetrics & Gynecology | Admitting: Obstetrics & Gynecology

## 2013-08-15 ENCOUNTER — Encounter (HOSPITAL_COMMUNITY): Payer: Self-pay | Admitting: *Deleted

## 2013-08-15 ENCOUNTER — Encounter (HOSPITAL_COMMUNITY): Payer: Medicare Other | Admitting: Anesthesiology

## 2013-08-15 DIAGNOSIS — R9389 Abnormal findings on diagnostic imaging of other specified body structures: Secondary | ICD-10-CM | POA: Insufficient documentation

## 2013-08-15 DIAGNOSIS — N840 Polyp of corpus uteri: Secondary | ICD-10-CM | POA: Insufficient documentation

## 2013-08-15 DIAGNOSIS — N859 Noninflammatory disorder of uterus, unspecified: Secondary | ICD-10-CM | POA: Insufficient documentation

## 2013-08-15 DIAGNOSIS — N950 Postmenopausal bleeding: Secondary | ICD-10-CM | POA: Insufficient documentation

## 2013-08-15 HISTORY — PX: HYSTEROSCOPY W/D&C: SHX1775

## 2013-08-15 HISTORY — DX: Other specified postprocedural states: Z98.890

## 2013-08-15 HISTORY — DX: Other specified postprocedural states: R11.2

## 2013-08-15 SURGERY — DILATATION AND CURETTAGE /HYSTEROSCOPY
Anesthesia: General | Site: Vagina | Wound class: Clean Contaminated

## 2013-08-15 MED ORDER — LIDOCAINE HCL (CARDIAC) 20 MG/ML IV SOLN
INTRAVENOUS | Status: DC | PRN
  Administered 2013-08-15: 30 mg via INTRAVENOUS

## 2013-08-15 MED ORDER — PROPOFOL 10 MG/ML IV EMUL
INTRAVENOUS | Status: AC
  Filled 2013-08-15: qty 20

## 2013-08-15 MED ORDER — SCOPOLAMINE 1 MG/3DAYS TD PT72
1.0000 | MEDICATED_PATCH | TRANSDERMAL | Status: DC
  Administered 2013-08-15: 1.5 mg via TRANSDERMAL

## 2013-08-15 MED ORDER — SCOPOLAMINE 1 MG/3DAYS TD PT72
MEDICATED_PATCH | TRANSDERMAL | Status: AC
  Filled 2013-08-15: qty 1

## 2013-08-15 MED ORDER — CHLOROPROCAINE HCL 1 % IJ SOLN
INTRAMUSCULAR | Status: AC
  Filled 2013-08-15: qty 30

## 2013-08-15 MED ORDER — ONDANSETRON HCL 4 MG/2ML IJ SOLN
INTRAMUSCULAR | Status: DC | PRN
  Administered 2013-08-15: 4 mg via INTRAMUSCULAR

## 2013-08-15 MED ORDER — GLYCINE 1.5 % IR SOLN
Status: DC | PRN
  Administered 2013-08-15: 3000 mL

## 2013-08-15 MED ORDER — CHLOROPROCAINE HCL 1 % IJ SOLN
INTRAMUSCULAR | Status: DC | PRN
  Administered 2013-08-15: 20 mL

## 2013-08-15 MED ORDER — LIDOCAINE HCL (CARDIAC) 20 MG/ML IV SOLN
INTRAVENOUS | Status: AC
  Filled 2013-08-15: qty 5

## 2013-08-15 MED ORDER — FAMOTIDINE 20 MG PO TABS
20.0000 mg | ORAL_TABLET | Freq: Once | ORAL | Status: AC
  Administered 2013-08-15: 20 mg via ORAL

## 2013-08-15 MED ORDER — FENTANYL CITRATE 0.05 MG/ML IJ SOLN
INTRAMUSCULAR | Status: DC | PRN
  Administered 2013-08-15: 100 ug via INTRAVENOUS

## 2013-08-15 MED ORDER — FENTANYL CITRATE 0.05 MG/ML IJ SOLN
INTRAMUSCULAR | Status: AC
  Administered 2013-08-15: 50 ug via INTRAVENOUS
  Filled 2013-08-15: qty 2

## 2013-08-15 MED ORDER — PROPOFOL 10 MG/ML IV BOLUS
INTRAVENOUS | Status: DC | PRN
  Administered 2013-08-15: 180 mg via INTRAVENOUS

## 2013-08-15 MED ORDER — FENTANYL CITRATE 0.05 MG/ML IJ SOLN
INTRAMUSCULAR | Status: AC
  Filled 2013-08-15: qty 2

## 2013-08-15 MED ORDER — MIDAZOLAM HCL 2 MG/2ML IJ SOLN
INTRAMUSCULAR | Status: AC
  Filled 2013-08-15: qty 2

## 2013-08-15 MED ORDER — SIMVASTATIN 40 MG PO TABS: 40.0000 mg | ORAL_TABLET | Freq: Every evening | ORAL | Status: DC

## 2013-08-15 MED ORDER — MIDAZOLAM HCL 5 MG/5ML IJ SOLN
INTRAMUSCULAR | Status: DC | PRN
  Administered 2013-08-15: 2 mg via INTRAVENOUS

## 2013-08-15 MED ORDER — FENTANYL CITRATE 0.05 MG/ML IJ SOLN
25.0000 ug | INTRAMUSCULAR | Status: DC | PRN
  Administered 2013-08-15: 50 ug via INTRAVENOUS

## 2013-08-15 MED ORDER — FAMOTIDINE 20 MG PO TABS
ORAL_TABLET | ORAL | Status: AC
  Administered 2013-08-15: 20 mg via ORAL
  Filled 2013-08-15: qty 1

## 2013-08-15 MED ORDER — ONDANSETRON HCL 4 MG/2ML IJ SOLN
INTRAMUSCULAR | Status: AC
  Filled 2013-08-15: qty 2

## 2013-08-15 MED ORDER — CEFAZOLIN SODIUM-DEXTROSE 2-3 GM-% IV SOLR
INTRAVENOUS | Status: AC
  Administered 2013-08-15: 2 g via INTRAVENOUS
  Filled 2013-08-15: qty 50

## 2013-08-15 MED ORDER — CEFAZOLIN SODIUM-DEXTROSE 2-3 GM-% IV SOLR: 2.0000 g | INTRAVENOUS | Status: DC

## 2013-08-15 MED ORDER — DEXAMETHASONE SODIUM PHOSPHATE 10 MG/ML IJ SOLN
INTRAMUSCULAR | Status: AC
  Filled 2013-08-15: qty 1

## 2013-08-15 MED ORDER — LACTATED RINGERS IV SOLN
INTRAVENOUS | Status: DC
  Administered 2013-08-15: 09:00:00 via INTRAVENOUS

## 2013-08-15 MED ORDER — DEXAMETHASONE SODIUM PHOSPHATE 10 MG/ML IJ SOLN
INTRAMUSCULAR | Status: DC | PRN
  Administered 2013-08-15: 10 mg via INTRAVENOUS

## 2013-08-15 SURGICAL SUPPLY — 14 items
CANISTER SUCTION 2500CC (MISCELLANEOUS) ×2 IMPLANT
CATH ROBINSON RED A/P 16FR (CATHETERS) ×2 IMPLANT
CLOTH BEACON ORANGE TIMEOUT ST (SAFETY) ×2 IMPLANT
CONTAINER PREFILL 10% NBF 60ML (FORM) ×4 IMPLANT
DRESSING TELFA 8X3 (GAUZE/BANDAGES/DRESSINGS) ×2 IMPLANT
GLOVE BIO SURGEON STRL SZ 6.5 (GLOVE) ×2 IMPLANT
GLOVE BIOGEL PI IND STRL 7.0 (GLOVE) ×1 IMPLANT
GLOVE BIOGEL PI INDICATOR 7.0 (GLOVE) ×1
GOWN STRL REIN XL XLG (GOWN DISPOSABLE) ×4 IMPLANT
LOOP ANGLED CUTTING 22FR (CUTTING LOOP) ×1 IMPLANT
PACK HYSTEROSCOPY LF (CUSTOM PROCEDURE TRAY) ×2 IMPLANT
PAD OB MATERNITY 4.3X12.25 (PERSONAL CARE ITEMS) ×2 IMPLANT
TOWEL OR 17X24 6PK STRL BLUE (TOWEL DISPOSABLE) ×4 IMPLANT
WATER STERILE IRR 1000ML POUR (IV SOLUTION) ×2 IMPLANT

## 2013-08-15 NOTE — Op Note (Signed)
08/15/2013  11:01 AM  PATIENT:  [name]  67 y.o. female  PRE-OPERATIVE DIAGNOSIS:  Postmenopausal Bleeding, Metaplasia    POST-OPERATIVE DIAGNOSIS:  Postmenopausal bleeding, Metaplasia, IU lesion  PROCEDURE:  Procedure(s): DILATATION AND CURETTAGE /HYSTEROSCOPY WITH RESECTION  SURGEON:  Surgeon(s): Genia Del, MD  ASSISTANTS: none   ANESTHESIA:   general  PROCEDURE:  Under general anesthesia with laryngeal mask, the patient is in lithotomy position.  She is prepped with Betadine on the suprapubic, vulvar and vaginal areas. She is draped as usual.  The vaginal exam reveals an anteverted uterus normal volume no adnexal mass. The speculum is inserted in the vagina and the anterior lip of the cervix is grasped with a tenaculum. The hysterometry is 7 cm. Dilation of the cervix with Hegar dilators up to #21.  The diagnostic hysteroscope was inserted in the intrauterine cavity. Both ostia are well visualized. We note thickening at the posterior wall of the intrauterine cavity with a polyploid lesion with increased vascularity and another are raised lesion which is less vascular.  The diagnostic hysteroscope was removed. Further dilation of the cervix is done with Hegar dilators up to #33 without difficulty. We then inserted the operative hysteroscope with the resectoscope and resected those posterior wall lesions completely.  Hemostasis was adequate. The resectoscope was then removed.  A systematic curettage of the intrauterine cavity was done with a sharp curet on all surfaces. Both specimens were sent together to pathology.  Pictures were taken before and after resection. The cervix was normal to inspection.  The tenaculum was removed. Hemostasis was adequate at that level.  The speculum was removed as well. The patient was brought to recovery room in good and stable status.  ESTIMATED BLOOD LOSS: 20 cc   Intake/Output Summary (Last 24 hours) at 08/15/13 1101 Last data filed at  08/15/13 1052  Gross per 24 hour  Intake      0 ml  Output    220 ml  Net   -220 ml     BLOOD ADMINISTERED:none   LOCAL MEDICATIONS USED:  Nesacaine 1%.  Amount: 20 ml  SPECIMEN:  Source of Specimen:  Resection of IU lesion and endometrial curettings  DISPOSITION OF SPECIMEN:  PATHOLOGY  COUNTS:  YES  PLAN OF CARE: Transfer to PACU   Genia Del MD  08/15/2013  At 11:01 am

## 2013-08-15 NOTE — Discharge Summary (Signed)
  Physician Discharge Summary  Patient ID: VALMAI VANDENBERGHE MRN: [id number] DOB/AGE: Aug 31, 1946 67 y.o.  Admit date: 08/15/2013 Discharge date: 08/15/2013  Admission Diagnoses: Postmenopausal Bleeding, Metaplasia  40981  Discharge Diagnoses: Postmenopausal Bleeding, Metaplasia  19147        Active Problems:   * No active hospital problems. *   Discharged Condition: good  Hospital Course: Outpatient  Consults: None  Treatments: surgery: Hysteroscopy, resection, D+C  Disposition: D/C home     Medication List    STOP taking these medications       simvastatin 40 MG tablet  Commonly known as:  ZOCOR      TAKE these medications       acetaminophen-codeine 300-30 MG per tablet  Commonly known as:  TYLENOL #3  Take 1 tablet by mouth as needed for pain.     ACIDOPHILUS PO  Take 1 tablet by mouth daily.     Biotin 5000 MCG Caps  Take 1 capsule by mouth daily.     celecoxib 200 MG capsule  Commonly known as:  CELEBREX  Take 200 mg by mouth daily.     cholecalciferol 1000 UNITS tablet  Commonly known as:  VITAMIN D  Take 2,000 Units by mouth daily.     EVISTA 60 MG tablet  Generic drug:  raloxifene  Take 60 mg by mouth daily.     losartan-hydrochlorothiazide 50-12.5 MG per tablet  Commonly known as:  HYZAAR  Take 1 tablet by mouth daily.     multivitamin with minerals Tabs tablet  Take 1 tablet by mouth daily.     nitrofurantoin 100 MG capsule  Commonly known as:  MACRODANTIN  Take 100 mg by mouth 2 (two) times daily.     omeprazole 20 MG capsule  Commonly known as:  PRILOSEC  Take 20 mg by mouth daily.     potassium chloride 10 MEQ tablet  Commonly known as:  K-DUR  Take 10 mEq by mouth every other day.     VIACTIV PO  Take 1 tablet by mouth daily.     vitamin C 100 MG tablet  Take 50 mg by mouth daily.           Follow-up Information   Follow up with Tula Schryver,MARIE-LYNE, MD In 3 weeks.   Specialty:  Obstetrics and Gynecology   Contact  information:   [address] [place] [address] [phone]       Signed: Genia Del, MD 08/15/2013, 11:25 AM

## 2013-08-15 NOTE — H&P (Addendum)
[name] is an 67 y.o. female G2P2  RP:  PMB with EBx showing metaplasia  Pertinent Gynecological History: Menses: post-menopausal Bleeding: PMB Contraception: Menopause Blood transfusions: none Sexually transmitted diseases: no past history Previous GYN Procedures: D+C Last mammogram: normal 10/2012  Last pap: normal 01/2013 OB History: G2P2  Menstrual History:  No LMP recorded. Patient is postmenopausal.    Past Medical History  Diagnosis Date  . Cancer     breast, skin  . Hypertension   . GERD (gastroesophageal reflux disease)   . Headache(784.0)     migraines  . Arthritis   . UTI symptoms     on abx  . PONV (postoperative nausea and vomiting)         Crohn's disease   Past Surgical History  Procedure Laterality Date  . Total hip arthroplasty  1987, 1989, 1997    x3  . Back fusion  2005, 2008, 2011    lumbar area  . Shoulder surgery      x 3  . Breast surgery      lumpectomy  . Bunionectomy      x2       Cholecystectomy      D+C  History reviewed. No pertinent family history.  Social History:  reports that she has never smoked. She has never used smokeless tobacco. She reports that she drinks alcohol. She reports that she does not use illicit drugs.  Allergies:  Allergies  Allergen Reactions  . Sulfa Antibiotics Hives and Rash  . Tincture Of Benzoin [Benzoin] Itching    Burned skin  . Demerol [Meperidine] Itching and Nausea Only    Depressed breathing and slurred speach  . Hydrocodone Itching and Nausea And Vomiting  . Oxycodone Itching and Nausea And Vomiting  . Tramadol Itching and Nausea Only    Prescriptions prior to admission  Medication Sig Dispense Refill  . acetaminophen-codeine (TYLENOL #3) 300-30 MG per tablet Take 1 tablet by mouth as needed for pain.      . Ascorbic Acid (VITAMIN C) 100 MG tablet Take 50 mg by mouth daily.      . Biotin 5000 MCG CAPS Take 1 capsule by mouth daily.      . Calcium-Vitamin D-Vitamin K (VIACTIV  PO) Take 1 tablet by mouth daily.      . celecoxib (CELEBREX) 200 MG capsule Take 200 mg by mouth daily.      . cholecalciferol (VITAMIN D) 1000 UNITS tablet Take 2,000 Units by mouth daily.      . Lactobacillus (ACIDOPHILUS PO) Take 1 tablet by mouth daily.      Marland Kitchen losartan-hydrochlorothiazide (HYZAAR) 50-12.5 MG per tablet Take 1 tablet by mouth daily.      . Multiple Vitamin (MULTIVITAMIN WITH MINERALS) TABS tablet Take 1 tablet by mouth daily.      . nitrofurantoin (MACRODANTIN) 100 MG capsule Take 100 mg by mouth 2 (two) times daily.      Marland Kitchen omeprazole (PRILOSEC) 20 MG capsule Take 20 mg by mouth daily.      . potassium chloride (K-DUR) 10 MEQ tablet Take 10 mEq by mouth every other day.      . raloxifene (EVISTA) 60 MG tablet Take 60 mg by mouth daily.      . simvastatin (ZOCOR) 40 MG tablet Take 40 mg by mouth every evening.        ROS  Blood pressure 142/97, pulse 92, temperature 98.6 F (37 C), temperature source Oral, resp. rate 18, SpO2 97.00%.  Physical Exam  Normal gyn exam Pelvic US Thick endometrium at 6.2 mm EBx Metaplasia  No results found for this or any previous visit (from the past 24 hour(s)).  No results found.  Assessment/Plan: PMB, EBx metaplasia for HSC, D+C.  Surgery/risks reviewed.   Wyett Narine,MARIE-LYNE 08/15/2013, 8:54 AM

## 2013-08-15 NOTE — Transfer of Care (Signed)
Immediate Anesthesia Transfer of Care Note  Patient: [name]  Procedure(s) Performed: Procedure(s): DILATATION AND CURETTAGE /HYSTEROSCOPY WITH RESECTION (N/A)  Patient Location: PACU  Anesthesia Type:General  Level of Consciousness: awake, alert , oriented and patient cooperative  Airway & Oxygen Therapy: Patient Spontanous Breathing and Patient connected to nasal cannula oxygen  Post-op Assessment: Report given to PACU RN, Post -op Vital signs reviewed and stable and Patient moving all extremities X 4  Post vital signs: Reviewed and stable  Complications: No apparent anesthesia complications

## 2013-08-15 NOTE — Anesthesia Preprocedure Evaluation (Addendum)
Anesthesia Evaluation  Patient identified by MRN, date of birth, ID band Patient awake    Reviewed: Allergy & Precautions, H&P , Patient's Chart, lab work & pertinent test results, reviewed documented beta blocker date and time   History of Anesthesia Complications (+) AWARENESS UNDER ANESTHESIA  Airway Mallampati: II TM Distance: >3 FB Neck ROM: full    Dental no notable dental hx.    Pulmonary  breath sounds clear to auscultation  Pulmonary exam normal       Cardiovascular hypertension (Normal EKG), On Medications Rhythm:regular Rate:Normal     Neuro/Psych TIA (No F/u; Migrainous TIA. Nl work-up)   GI/Hepatic GERD-  Medicated and Controlled,  Endo/Other    Renal/GU      Musculoskeletal   Abdominal   Peds  Hematology   Anesthesia Other Findings   Reproductive/Obstetrics                           Anesthesia Physical Anesthesia Plan  ASA: II  Anesthesia Plan:    Post-op Pain Management:    Induction: Intravenous  Airway Management Planned: LMA  Additional Equipment:   Intra-op Plan:   Post-operative Plan:   Informed Consent: I have reviewed the patients History and Physical, chart, labs and discussed the procedure including the risks, benefits and alternatives for the proposed anesthesia with the patient or authorized representative who has indicated his/her understanding and acceptance.   Dental Advisory Given and Dental advisory given  Plan Discussed with: CRNA and Surgeon  Anesthesia Plan Comments:         Anesthesia Quick Evaluation

## 2013-08-16 ENCOUNTER — Encounter (HOSPITAL_COMMUNITY): Payer: Self-pay | Admitting: Obstetrics & Gynecology

## 2013-08-16 NOTE — Anesthesia Postprocedure Evaluation (Signed)
  Anesthesia Post-op Note  Patient: [name]  Procedure(s) Performed: Procedure(s): DILATATION AND CURETTAGE /HYSTEROSCOPY WITH RESECTION (N/A) Patient is awake and responsive. Pain and nausea are reasonably well controlled. Vital signs are stable and clinically acceptable. Oxygen saturation is clinically acceptable. There are no apparent anesthetic complications at this time. Patient is ready for discharge.

## 2013-09-13 ENCOUNTER — Other Ambulatory Visit: Payer: Self-pay

## 2013-10-02 ENCOUNTER — Other Ambulatory Visit: Payer: Self-pay

## 2013-10-02 DIAGNOSIS — Z1231 Encounter for screening mammogram for malignant neoplasm of breast: Secondary | ICD-10-CM

## 2013-11-13 ENCOUNTER — Ambulatory Visit
Admission: RE | Admit: 2013-11-13 | Discharge: 2013-11-13 | Disposition: A | Discharge status: Home / Self Care | Payer: Medicare Other | Source: Ambulatory Visit

## 2013-11-13 DIAGNOSIS — Z1231 Encounter for screening mammogram for malignant neoplasm of breast: Secondary | ICD-10-CM

## 2013-11-28 ENCOUNTER — Encounter: Payer: Self-pay | Admitting: *Deleted

## 2013-11-30 ENCOUNTER — Ambulatory Visit (INDEPENDENT_AMBULATORY_CARE_PROVIDER_SITE_OTHER): Payer: Medicare Other | Admitting: Interventional Cardiology

## 2013-11-30 ENCOUNTER — Encounter: Payer: Self-pay | Admitting: Interventional Cardiology

## 2013-11-30 VITALS — BP 142/88 | HR 94 | Ht 60.0 in | Wt 178.0 lb

## 2013-11-30 DIAGNOSIS — Z8672 Personal history of thrombophlebitis: Secondary | ICD-10-CM

## 2013-11-30 DIAGNOSIS — I10 Essential (primary) hypertension: Secondary | ICD-10-CM

## 2013-11-30 DIAGNOSIS — R0609 Other forms of dyspnea: Secondary | ICD-10-CM

## 2013-11-30 DIAGNOSIS — R55 Syncope and collapse: Secondary | ICD-10-CM

## 2013-11-30 DIAGNOSIS — E7800 Pure hypercholesterolemia, unspecified: Secondary | ICD-10-CM

## 2013-11-30 DIAGNOSIS — M479 Spondylosis, unspecified: Secondary | ICD-10-CM

## 2013-11-30 DIAGNOSIS — R0989 Other specified symptoms and signs involving the circulatory and respiratory systems: Secondary | ICD-10-CM

## 2013-11-30 LAB — BRAIN NATRIURETIC PEPTIDE: PRO B NATRI PEPTIDE: 15 pg/mL (ref 0.0–100.0)

## 2013-11-30 NOTE — Patient Instructions (Signed)
Your physician recommends that you continue on your current medications as directed. Please refer to the Current Medication list given to you today.  Lab today: BNP  Your physician has requested that you have a lexiscan myoview. For further information please visit [email or web address]. Please follow instruction sheet, as given.  Follow pending results of lab and lexiscan

## 2013-11-30 NOTE — Progress Notes (Signed)
Patient ID: [name], female   DOB: 12-27-1945, 68 y.o.   MRN: [id number]   Date: 11/30/2013 ID: [name], DOB [birth year], MRN [id number] PCP: Shirline Frees, MD  Reason: Syncope. 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 Moderate to severe exertional dyspnea 3. Hypertension 4. Hyperlipidemia 5. Osteoarthritis with sedentary lifestyle and physical deconditioning 6. Obesity  PLAN:  1. Brain natruretic peptide 2. Lexiscan Cardiolite study   SUBJECTIVE: [name] is a 68 y.o. female who is referred for evaluation of her recent fainting episode. This episode occurred up proximally 7 days ago. While sitting at the kitchen table having breakfast, the patient began feeling nausea, weakness, and diaphoresis. She got up to go to the bathroom. She began having, vision and feeling as though she might faint. After arriving in the bathroom she became very weak and sat on the commode. She then fainted and shortly thereafter found herself on the floor. She realizes she is lost bowel control while sitting on the commode. She was able to get herself from the floor but felt lightheaded and weak. She got in the bed and gradually began feeling better. She'll after getting to bed her husband called and felt that her speech was slurred and she could not understand his request. He came home immediately and upon evaluating her felt that she was back to baseline. She has been active since that time has had no recurrence of lightheadedness, nausea, fainting, or other complaint.  The patient had 2 prior fainting episodes as an adolescent. Once when she was having blood drawn at a doctor's office. Another episode occurred when she received a vaccine.  Of more significant concern is progressive dyspnea on exertion. This is been increasing over the past 12-24 months. She has significant osteoarthritis and has physical  limitations. She precipitates and water aerobics. She was very displeased when on a trip to [place] last summer, she could not participate in many of the activities because she was too short of breath to walk. Walking stairs, distances, or briskly causes significant dyspnea. There is no chest discomfort. She has not had orthopnea. She denies edema.   Allergies  Allergen Reactions  . Sulfa Antibiotics Hives and Rash  . Tincture Of Benzoin [Benzoin] Itching    Burned skin  . Demerol [Meperidine] Itching and Nausea Only    Depressed breathing and slurred speach  . Hydrocodone Itching and Nausea And Vomiting  . Oxycodone Itching and Nausea And Vomiting  . Tramadol Itching and Nausea Only    Current Outpatient Prescriptions on File Prior to Visit  Medication Sig Dispense Refill  . acetaminophen-codeine (TYLENOL #3) 300-30 MG per tablet Take 1 tablet by mouth as needed for pain.      . Ascorbic Acid (VITAMIN C) 100 MG tablet Take 500 mg by mouth daily.       . Biotin 5000 MCG CAPS Take 1 capsule by mouth daily.      . Calcium-Vitamin D-Vitamin K (VIACTIV PO) Take 1 tablet by mouth daily.      . celecoxib (CELEBREX) 200 MG capsule Take 200 mg by mouth daily.      . Lactobacillus (ACIDOPHILUS PO) Take 1 tablet by mouth daily.      Marland Kitchen loratadine (CLARITIN) 10 MG tablet Take 10 mg by mouth daily as needed.       Marland Kitchen losartan-hydrochlorothiazide (HYZAAR) 50-12.5 MG per tablet Take 1 tablet by mouth daily.      Marland Kitchen  Multiple Vitamin (MULTIVITAMIN WITH MINERALS) TABS tablet Take 1 tablet by mouth daily.      . nitrofurantoin (MACRODANTIN) 100 MG capsule Take 100 mg by mouth daily as needed.       Marland Kitchen omeprazole (PRILOSEC) 20 MG capsule Take 20 mg by mouth daily.      . potassium chloride (K-DUR) 10 MEQ tablet Take 10 mEq by mouth every other day.      . raloxifene (EVISTA) 60 MG tablet Take 60 mg by mouth daily.      . simvastatin (ZOCOR) 40 MG tablet Take 1 tablet (40 mg total) by mouth every evening.  30  tablet  0   No current facility-administered medications on file prior to visit.    Past Medical History  Diagnosis Date  . Cancer     breast, skin  . Hypertension   . GERD (gastroesophageal reflux disease)   . Headache(784.0)     migraines  . Arthritis   . UTI symptoms     on abx  . PONV (postoperative nausea and vomiting)   . Hyperlipidemia     Past Surgical History  Procedure Laterality Date  . Total hip arthroplasty  1987, 1989, 1997    x3  . Back fusion  2005, 2008, 2011    lumbar area  . Shoulder surgery      x 3  . Breast surgery      lumpectomy  . Bunionectomy      x2  . Hysteroscopy w/d&c N/A 08/15/2013    Procedure: DILATATION AND CURETTAGE /HYSTEROSCOPY WITH RESECTION;  Surgeon: Princess Bruins, MD;  Location: [facility];  Service: Gynecology;  Laterality: N/A;    History   Social History  . Marital Status: Married    Spouse Name: N/A    Number of Children: N/A  . Years of Education: N/A   Occupational History  . Not on file.   Social History Main Topics  . Smoking status: Never Smoker   . Smokeless tobacco: Never Used  . Alcohol Use: Yes     Comment: occasionally  . Drug Use: No  . Sexual Activity: Yes    Birth Control/ Protection: Post-menopausal   Other Topics Concern  . Not on file   Social History Narrative  . No narrative on file    Family History  Problem Relation Age of Onset  . Cancer Mother     breast  . Hypertension Father   . Heart attack Father   . Hyperlipidemia Father   . Cancer Father     colon  . Hypertension Sister   . Cancer Maternal Grandmother     breast  . Stroke Maternal Grandfather   . Heart attack Paternal Grandmother   . Cancer Paternal Grandmother     cervical  . Heart attack Paternal Grandfather     ROS: No history of heart disease. No history of stroke. No blood in urine or stool. No history of kidney disease. She does have hypertension. She takes her medications regularly. She does not use  over-the-counter medications.. Other systems negative for complaints.  OBJECTIVE: BP 142/88  Pulse 94  Ht 5' (1.524 m)  Wt 178 lb (80.74 kg)  BMI 34.76 kg/m2,  General: No acute distress, obese HEENT: normal no pallor or jaundice Neck: JVD flat. Carotids without bruits and upstroke is 2+ bilateral Chest: Faint basilar crackles Cardiac: Murmur: None. Gallop: None. Rhythm: Regular. Other: Normal Abdomen: Bruit: Absent. Pulsation: Absent Extremities: Edema: Absent. Pulses: 2+ and symmetric Neuro:  Normal Psych: Normal  ECG: Normal sinus rhythm with nonspecific ST abnormality and low voltage.

## 2013-12-04 ENCOUNTER — Telehealth: Payer: Self-pay

## 2013-12-04 NOTE — Telephone Encounter (Signed)
pt given lab results.  No evidence of fluid in lung. pt verbalize understanding.

## 2013-12-04 NOTE — Telephone Encounter (Signed)
Message copied by Lamar Laundry on Tue Dec 04, 2013 10:11 AM ------      Message from: Daneen Schick      Created: Sun Dec 02, 2013  2:40 PM       No evidence of fluid in lung ------

## 2013-12-13 ENCOUNTER — Encounter: Payer: Self-pay | Admitting: Cardiology

## 2013-12-13 ENCOUNTER — Ambulatory Visit (HOSPITAL_COMMUNITY): Payer: Medicare Other | Attending: Cardiology | Admitting: Radiology

## 2013-12-13 VITALS — BP 111/75 | Ht 60.0 in | Wt 175.0 lb

## 2013-12-13 DIAGNOSIS — I10 Essential (primary) hypertension: Secondary | ICD-10-CM | POA: Insufficient documentation

## 2013-12-13 DIAGNOSIS — R0789 Other chest pain: Secondary | ICD-10-CM

## 2013-12-13 DIAGNOSIS — Z8249 Family history of ischemic heart disease and other diseases of the circulatory system: Secondary | ICD-10-CM | POA: Insufficient documentation

## 2013-12-13 DIAGNOSIS — R079 Chest pain, unspecified: Secondary | ICD-10-CM | POA: Insufficient documentation

## 2013-12-13 DIAGNOSIS — Z853 Personal history of malignant neoplasm of breast: Secondary | ICD-10-CM | POA: Insufficient documentation

## 2013-12-13 DIAGNOSIS — Z8673 Personal history of transient ischemic attack (TIA), and cerebral infarction without residual deficits: Secondary | ICD-10-CM | POA: Insufficient documentation

## 2013-12-13 DIAGNOSIS — R0989 Other specified symptoms and signs involving the circulatory and respiratory systems: Secondary | ICD-10-CM | POA: Insufficient documentation

## 2013-12-13 DIAGNOSIS — R0609 Other forms of dyspnea: Secondary | ICD-10-CM

## 2013-12-13 DIAGNOSIS — R55 Syncope and collapse: Secondary | ICD-10-CM | POA: Insufficient documentation

## 2013-12-13 DIAGNOSIS — E785 Hyperlipidemia, unspecified: Secondary | ICD-10-CM | POA: Insufficient documentation

## 2013-12-13 DIAGNOSIS — R42 Dizziness and giddiness: Secondary | ICD-10-CM | POA: Insufficient documentation

## 2013-12-13 DIAGNOSIS — R0602 Shortness of breath: Secondary | ICD-10-CM

## 2013-12-13 HISTORY — PX: NM MYOVIEW LTD: HXRAD82

## 2013-12-13 MED ORDER — REGADENOSON 0.4 MG/5ML IV SOLN
0.4000 mg | Freq: Once | INTRAVENOUS | Status: AC
  Administered 2013-12-13: 0.4 mg via INTRAVENOUS

## 2013-12-13 MED ORDER — AMINOPHYLLINE 25 MG/ML IV SOLN
75.0000 mg | Freq: Once | INTRAVENOUS | Status: AC
  Administered 2013-12-13: 75 mg via INTRAVENOUS

## 2013-12-13 MED ORDER — TECHNETIUM TC 99M SESTAMIBI GENERIC - CARDIOLITE
33.0000 | Freq: Once | INTRAVENOUS | Status: AC | PRN
  Administered 2013-12-13: 33 via INTRAVENOUS

## 2013-12-13 MED ORDER — TECHNETIUM TC 99M SESTAMIBI GENERIC - CARDIOLITE
10.8000 | Freq: Once | INTRAVENOUS | Status: AC | PRN
  Administered 2013-12-13: 11 via INTRAVENOUS

## 2013-12-13 NOTE — Progress Notes (Signed)
[facility] [address] [place], [place] [address] [phone]    Cardiology Nuclear Med Study  AIRIEL [name] is a 68 y.o. female     MRN : [id number]     DOB: 31-Jan-1946  Procedure Date: 12/13/2013  Nuclear Med Background Indication for Stress Test:  Evaluation for Ischemia, syncope History:  (L) Breast CA Lumpectomy Cardiac Risk Factors: Family History - CAD, Hypertension, Lipids and TIA  Symptoms:  Chest Pain, Dizziness, DOE, SOB and Syncope   Nuclear Pre-Procedure Caffeine/Decaff Intake:  None NPO After: 7 pm   Lungs:  clear O2 Sat: 95% on room air. IV 0.9% NS with Angio Cath:  24g  IV Site: R Hand  IV Started by:  Crissie Figures, RN  Chest Size (in):  40 Cup Size: C  Height: 5' (1.524 m)  Weight:  175 lb (79.379 kg)  BMI:  Body mass index is 34.18 kg/(m^2). Tech Comments:  Aminophylline 75 mg IV given for symptoms-all were resolved after several minutes. S.Williams EMTP    Nuclear Med Study 1 or 2 day study: 1 day  Stress Test Type:  Carlton Adam  Reading MD: N/A  Order Authorizing Provider:  Daneen Schick, MD  Resting Radionuclide: Technetium 67m Sestamibi  Resting Radionuclide Dose: 11.0 mCi   Stress Radionuclide:  Technetium 66m Sestamibi  Stress Radionuclide Dose: 33.0 mCi           Stress Protocol Rest HR: 73 Stress HR: 105  Rest BP: 111/75 Stress BP: 123/76  Exercise Time (min): n/a METS: n/a   Predicted Max HR: 152 bpm % Max HR: 69.08 bpm Rate Pressure Product: 12915   Dose of Adenosine (mg):  n/a Dose of Lexiscan: 0.4 mg  Dose of Atropine (mg): n/a Dose of Dobutamine: n/a mcg/kg/min (at max HR)  Stress Test Technologist: Perrin Maltese, EMT-P  Nuclear Technologist:  Annye Rusk, CNMT     Rest Procedure:  Myocardial perfusion imaging was performed at rest 45 minutes following the intravenous administration of Technetium 26m Sestamibi. Rest ECG: NSR - Normal EKG  Stress Procedure:  The patient received IV Lexiscan 0.4 mg  over 15-seconds.  Technetium 45m Sestamibi injected at 30-seconds. This patient had pressure in her face, chest tightness, and sob with the Lexiscan injection. Quantitative spect images were obtained after a 45 minute delay. Stress ECG: No significant change from baseline ECG  QPS Raw Data Images:  Normal; no motion artifact; normal heart/lung ratio. Stress Images:  There is mildly reduced uptake mall in size along the apex and distal anterolateral segment.  Rest Images:  There is mildly reduced uptake mall in size along the apex and distal anterolateral segment. Subtraction (SDS):  No evidence of ischemia. Transient Ischemic Dilatation (Normal <1.22):  .94 Lung/Heart Ratio (Normal <0.45):  .24  Quantitative Gated Spect Images QGS EDV:  71 ml QGS ESV:  30 ml  Impression Exercise Capacity:  Lexiscan with no exercise. BP Response:  Normal blood pressure response. Clinical Symptoms:  Typical symptoms with Lexiscan. ECG Impression:  No significant ST segment change suggestive of ischemia. Comparison with Prior Nuclear Study: No previous nuclear study performed  Overall Impression:  Low risk stress nuclear study with no areas of significant ischemia. Mild apical, distal anterolateral wall perfusion defect is mostly fixed and likely represents attenuation artifact. .  LV Ejection Fraction: 58%.  LV Wall Motion:  NL LV Function; NL Wall Motion  Candee Furbish, MD

## 2013-12-14 ENCOUNTER — Telehealth: Payer: Self-pay | Admitting: Interventional Cardiology

## 2013-12-14 NOTE — Telephone Encounter (Signed)
pt given results of nuclear stress test.  No evidence of blocked arteries on nuclear study; no further ischemic evaluation is necessary.pt verbalized understanding.

## 2013-12-14 NOTE — Telephone Encounter (Signed)
New message         Pt is calling about nuclear stress test results

## 2013-12-14 NOTE — Telephone Encounter (Signed)
Message copied by Lamar Laundry on Fri Dec 14, 2013  3:43 PM ------      Message from: Daneen Schick      Created: Fri Dec 14, 2013 11:17 AM       No evidence of blocked arteries on nuclear study; no further ischemic evaluation is necessary. ------

## 2014-01-07 ENCOUNTER — Ambulatory Visit (INDEPENDENT_AMBULATORY_CARE_PROVIDER_SITE_OTHER): Payer: Medicare Other | Admitting: Podiatry

## 2014-01-07 ENCOUNTER — Encounter: Payer: Self-pay | Admitting: Podiatry

## 2014-01-07 VITALS — BP 103/69 | HR 99 | Resp 16 | Ht 60.0 in | Wt 175.0 lb

## 2014-01-07 DIAGNOSIS — L600 Ingrowing nail: Secondary | ICD-10-CM

## 2014-01-07 DIAGNOSIS — B351 Tinea unguium: Secondary | ICD-10-CM

## 2014-01-07 DIAGNOSIS — M79609 Pain in unspecified limb: Secondary | ICD-10-CM

## 2014-01-07 DIAGNOSIS — L608 Other nail disorders: Secondary | ICD-10-CM

## 2014-01-07 MED ORDER — NEOMYCIN-POLYMYXIN-HC 3.5-10000-1 OT SOLN: OTIC | Status: DC

## 2014-01-07 NOTE — Progress Notes (Signed)
   Subjective:    Patient ID: [name], female    DOB: June 02, 1946, 68 y.o.   MRN: [id number]  HPI Comments: Both of my big toenails are ingrown. The right one has been like that for almost 50 yrs, the left one for about 1 month. All my toenails hurt. My husband trims my toenails. i have the toenail fungus for it least 10 yrs. i took lamisil  It least 25 yrs ago and it didn't help. i havent done anything for the fungus.     Review of Systems     Objective:   Physical Exam I have reviewed her past history medications allergies surgeries and social history. Pulses are strongly palpable bilateral foot. Neurologic sensorium is intact. Deep tendon reflexes are intact bilateral. Muscle strength is 5 over 5 dorsiflexors plantar flexors inverters everters all intrinsic musculature is intact. Orthopedic evaluation demonstrates all joints distal to the ankle a full range of motion the crepitus is flexible hammertoe deformities noted bilateral. Cutaneous evaluation demonstrates supple well hydrated cutis. Sharp incurvated nail margins along the tibial border of the hallux right. Hallux nail plate left left appears to be normal. All the other nail plates are thick yellow dystrophic onychomycotic care rule out nail dystrophy.        Assessment & Plan:  Assessment: Nail dystrophy care rule out onychomycosis 1 through 5 right 2 through 5 left. We also performed a chemical matrixectomy to the tibial border of the hallux right. She tolerated this procedure well and she will start soaking on twice a day basis and Betadine water I will followup with her in one week. She was given both oral and written home-going instructions for the care of this foot as well as a prescription for Cortisporin Otic.

## 2014-01-07 NOTE — Patient Instructions (Signed)

## 2014-01-14 ENCOUNTER — Ambulatory Visit: Payer: Medicare Other | Admitting: Podiatry

## 2014-01-16 ENCOUNTER — Ambulatory Visit (INDEPENDENT_AMBULATORY_CARE_PROVIDER_SITE_OTHER): Payer: Medicare Other | Admitting: Podiatry

## 2014-01-16 VITALS — BP 99/68 | HR 90 | Resp 16 | Ht 60.0 in | Wt 175.0 lb

## 2014-01-16 DIAGNOSIS — Z9889 Other specified postprocedural states: Secondary | ICD-10-CM

## 2014-01-16 NOTE — Progress Notes (Signed)
She presents today for followup matrixectomy hallux right. She continues to soak in Betadine warm water and apply Cortisporin otic as directed. She states it is only painful at night of the procedure.  Objective: Vital signs are stable she is alert and oriented x3. There is no erythema edema saline is drainage or odor. The margin is still opened and is healing slowly.  Assessment: Well-healing matrixectomy hallux right tibial border.  Plan: Discontinue Betadine start with Epsom salts in warm water soaks covered in the day and leave open at night. Continue the use of Cortisporin Otic. A day and night and I will followup with her on an as-needed basis.

## 2014-02-04 ENCOUNTER — Encounter: Payer: Self-pay | Admitting: Podiatry

## 2014-02-20 ENCOUNTER — Ambulatory Visit (INDEPENDENT_AMBULATORY_CARE_PROVIDER_SITE_OTHER): Payer: Medicare Other | Admitting: Podiatry

## 2014-02-20 VITALS — BP 102/70 | HR 99 | Resp 16

## 2014-02-20 DIAGNOSIS — Z79899 Other long term (current) drug therapy: Secondary | ICD-10-CM

## 2014-02-20 MED ORDER — TERBINAFINE HCL 250 MG PO TABS: 250.0000 mg | ORAL_TABLET | Freq: Every day | ORAL | Status: DC

## 2014-02-20 NOTE — Progress Notes (Signed)
She presents today for followup of her fungal culture which did come back positive for fungus and mold.  Objective: Onychomycosis bilateral.  Assessment: Onychomycosis bilateral.  Plan: Discussed the the use of oral Lamisil over laser therapy or topical. At this point we are 1-1/2 liver profiles performed and dispensed 30 tablets of Lamisil 250 mg each one daily. I will followup with her in one month.

## 2014-02-21 LAB — CBC WITH DIFFERENTIAL/PLATELET
BASOS ABS: 0 10*3/uL (ref 0.0–0.2)
Basos: 1 %
EOS: 3 %
Eosinophils Absolute: 0.2 10*3/uL (ref 0.0–0.4)
HCT: 43.3 % (ref 34.0–46.6)
HEMOGLOBIN: 14.5 g/dL (ref 11.1–15.9)
Immature Grans (Abs): 0 10*3/uL (ref 0.0–0.1)
Immature Granulocytes: 0 %
LYMPHS ABS: 1.9 10*3/uL (ref 0.7–3.1)
LYMPHS: 36 %
MCH: 29.9 pg (ref 26.6–33.0)
MCHC: 33.5 g/dL (ref 31.5–35.7)
MCV: 89 fL (ref 79–97)
MONOCYTES: 7 %
Monocytes Absolute: 0.4 10*3/uL (ref 0.1–0.9)
NEUTROS ABS: 2.8 10*3/uL (ref 1.4–7.0)
Neutrophils Relative %: 53 %
RBC: 4.85 x10E6/uL (ref 3.77–5.28)
RDW: 13.7 % (ref 12.3–15.4)
WBC: 5.3 10*3/uL (ref 3.4–10.8)

## 2014-02-21 LAB — HEPATIC FUNCTION PANEL
ALT: 17 IU/L (ref 0–32)
AST: 22 IU/L (ref 0–40)
Albumin: 4.1 g/dL (ref 3.6–4.8)
Alkaline Phosphatase: 81 IU/L (ref 39–117)
BILIRUBIN TOTAL: 0.4 mg/dL (ref 0.0–1.2)
Bilirubin, Direct: 0.1 mg/dL (ref 0.00–0.40)
Total Protein: 6.3 g/dL (ref 6.0–8.5)

## 2014-03-06 ENCOUNTER — Ambulatory Visit
Admission: RE | Admit: 2014-03-06 | Discharge: 2014-03-06 | Disposition: A | Discharge status: Home / Self Care | Payer: Medicare Other | Source: Ambulatory Visit | Attending: Family Medicine | Admitting: Family Medicine

## 2014-03-06 ENCOUNTER — Other Ambulatory Visit: Payer: Self-pay | Admitting: Family Medicine

## 2014-03-06 DIAGNOSIS — R079 Chest pain, unspecified: Secondary | ICD-10-CM

## 2014-03-06 DIAGNOSIS — R109 Unspecified abdominal pain: Secondary | ICD-10-CM | POA: Insufficient documentation

## 2014-03-06 HISTORY — DX: Unspecified abdominal pain: R10.9

## 2014-03-08 ENCOUNTER — Other Ambulatory Visit: Payer: Self-pay | Admitting: Family Medicine

## 2014-03-08 DIAGNOSIS — R1012 Left upper quadrant pain: Secondary | ICD-10-CM

## 2014-03-14 ENCOUNTER — Ambulatory Visit
Admission: RE | Admit: 2014-03-14 | Discharge: 2014-03-14 | Disposition: A | Discharge status: Home / Self Care | Payer: Medicare Other | Source: Ambulatory Visit | Attending: Family Medicine | Admitting: Family Medicine

## 2014-03-14 DIAGNOSIS — R1012 Left upper quadrant pain: Secondary | ICD-10-CM

## 2014-03-14 MED ORDER — IOHEXOL 300 MG/ML  SOLN
100.0000 mL | Freq: Once | INTRAMUSCULAR | Status: AC | PRN
  Administered 2014-03-14: 100 mL via INTRAVENOUS

## 2014-03-20 ENCOUNTER — Ambulatory Visit: Payer: Medicare Other | Admitting: Podiatry

## 2014-03-22 ENCOUNTER — Telehealth: Payer: Self-pay | Admitting: Internal Medicine

## 2014-03-22 NOTE — Telephone Encounter (Signed)
Spoke with patient and she prefers to see Dr. Olevia Perches. Scheduled on 04/02/14 at 8:45 AM.

## 2014-03-25 ENCOUNTER — Encounter: Payer: Self-pay | Admitting: Podiatry

## 2014-03-25 ENCOUNTER — Ambulatory Visit (INDEPENDENT_AMBULATORY_CARE_PROVIDER_SITE_OTHER): Payer: Medicare Other | Admitting: Podiatry

## 2014-03-25 VITALS — BP 103/68 | HR 86 | Resp 18

## 2014-03-25 DIAGNOSIS — Z79899 Other long term (current) drug therapy: Secondary | ICD-10-CM

## 2014-03-25 MED ORDER — TERBINAFINE HCL 250 MG PO TABS: 250.0000 mg | ORAL_TABLET | Freq: Every day | ORAL | Status: DC

## 2014-03-25 NOTE — Progress Notes (Signed)
She presents today for her followup of Lamisil. She states that she took the first 30 days without any problems. She denies fever chills nausea vomiting muscle aches or pains or rashes.  Objective: Onychomycosis long-term therapy no complications with Lamisil.  Assessment: Onychomycosis long-term therapy with Lamisil no complications.  Plan: Sent her out for another CBC and liver profile. I will followup with her in 4 months she was prescribed 90 more days of Lamisil.

## 2014-03-26 LAB — HEPATIC FUNCTION PANEL
ALBUMIN: 4.3 g/dL (ref 3.6–4.8)
ALT: 15 IU/L (ref 0–32)
AST: 19 IU/L (ref 0–40)
Alkaline Phosphatase: 72 IU/L (ref 39–117)
BILIRUBIN TOTAL: 0.5 mg/dL (ref 0.0–1.2)
Bilirubin, Direct: 0.13 mg/dL (ref 0.00–0.40)
TOTAL PROTEIN: 6.3 g/dL (ref 6.0–8.5)

## 2014-03-26 LAB — CBC WITH DIFFERENTIAL/PLATELET
BASOS ABS: 0 10*3/uL (ref 0.0–0.2)
BASOS: 1 %
EOS ABS: 0.1 10*3/uL (ref 0.0–0.4)
Eos: 2 %
HCT: 41.3 % (ref 34.0–46.6)
Hemoglobin: 13.8 g/dL (ref 11.1–15.9)
Immature Grans (Abs): 0 10*3/uL (ref 0.0–0.1)
Immature Granulocytes: 0 %
LYMPHS: 32 %
Lymphocytes Absolute: 1.5 10*3/uL (ref 0.7–3.1)
MCH: 29.6 pg (ref 26.6–33.0)
MCHC: 33.4 g/dL (ref 31.5–35.7)
MCV: 89 fL (ref 79–97)
MONOS ABS: 0.3 10*3/uL (ref 0.1–0.9)
Monocytes: 7 %
Neutrophils Absolute: 2.8 10*3/uL (ref 1.4–7.0)
Neutrophils Relative %: 58 %
RBC: 4.66 x10E6/uL (ref 3.77–5.28)
RDW: 13.8 % (ref 12.3–15.4)
WBC: 4.8 10*3/uL (ref 3.4–10.8)

## 2014-03-27 ENCOUNTER — Telehealth: Payer: Self-pay | Admitting: *Deleted

## 2014-03-27 NOTE — Telephone Encounter (Signed)
Message copied by Dierdre Searles on Wed Mar 27, 2014  8:49 AM ------      Message from: Tyson Dense T      Created: Tue Mar 26, 2014  5:33 PM       Liver profile and CBC are good.May continue medication. ------

## 2014-03-27 NOTE — Telephone Encounter (Signed)
Left message regarding blood work

## 2014-04-02 ENCOUNTER — Ambulatory Visit (INDEPENDENT_AMBULATORY_CARE_PROVIDER_SITE_OTHER): Payer: Medicare Other | Admitting: Internal Medicine

## 2014-04-02 ENCOUNTER — Encounter: Payer: Self-pay | Admitting: Internal Medicine

## 2014-04-02 VITALS — BP 126/74 | HR 76 | Ht 60.0 in | Wt 180.0 lb

## 2014-04-02 DIAGNOSIS — M4120 Other idiopathic scoliosis, site unspecified: Secondary | ICD-10-CM

## 2014-04-02 DIAGNOSIS — R1012 Left upper quadrant pain: Secondary | ICD-10-CM

## 2014-04-02 MED ORDER — MOVIPREP 100 G PO SOLR: 1.0000 | Freq: Once | ORAL | Status: DC

## 2014-04-02 NOTE — Progress Notes (Signed)
[name] 08/21/1946 [id number]  Note: This dictation was prepared with Dragon digital system. Any transcriptional errors that result from this procedure are unintentional.   History of Present Illness:  This is a 68 year old white female with a several week history of rather intense pain along the left costal margin radiating laterally. It started bothering her every evening around 9 p.m. while she was watching TV, about 2 hours after supper. It was partially positional, it subsided during the night and it was made worse by deep inspiration. Bowel habits have been regular. She is having bowel movements twice a day. She denies being constipated or passing any blood. She denies nausea or vomiting. A CT scan of the abdomen showed her to be status prior cholecystectomy. She has a duodenal diverticulum. There was no active process in the left upper quadrant. Her colonoscopies were in 2002 and in February 2008 for diarrhea and they showed a deformed ileocecal valve but no evidence of inflammatory bowel disease. 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 She also had bilateral hip replacement. She walks with a cane.    Past Medical History  Diagnosis Date  . Cancer     breast, skin  . Hypertension   . GERD (gastroesophageal reflux disease)   . Headache(784.0)     migraines  . Arthritis   . UTI symptoms     on abx  . PONV (postoperative nausea and vomiting)   . Hyperlipidemia   . Crohn's disease   . Breast cancer   . DJD (degenerative joint disease)     Past Surgical History  Procedure Laterality Date  . Total hip arthroplasty  1987, 1989, 1997    x3  . Back fusion  2005, 2008, 2011    lumbar area  . Shoulder surgery      x 3  . Breast surgery      lumpectomy  . Bunionectomy       x2  . Hysteroscopy w/d&c N/A 08/15/2013    Procedure: DILATATION AND CURETTAGE /HYSTEROSCOPY WITH RESECTION;  Surgeon: Princess Bruins, MD;  Location: [facility];  Service: Gynecology;  Laterality: N/A;    Allergies  Allergen Reactions  . Sulfa Antibiotics Hives and Rash  . Tincture Of Benzoin [Benzoin] Itching    Burned skin  . Demerol [Meperidine] Itching and Nausea Only    Depressed breathing and slurred speach  . Hydrocodone Itching and Nausea And Vomiting  . Oxycodone Itching and Nausea And Vomiting  . Tramadol Itching and Nausea Only    Family history and social history have been reviewed.  Review of Systems: Denies heartburn nausea vomiting. Her weight has been steadily rising  The remainder of the 10 point ROS is negative except as outlined in the H&P  Physical Exam: General Appearance Well developed, in no distress, walks with a cane Eyes  Non icteric  HEENT  Non traumatic, normocephalic  Mouth No lesion, tongue papillated, no cheilosis Neck Supple without adenopathy, thyroid not enlarged, no carotid bruits, no JVD Lungs Clear to auscultation bilaterally COR Normal S1, normal S2, regular rhythm, no murmur, quiet precordium Abdomen soft, tender in left costal margin extending laterally and all the way to the back at the level of about T 12. Ribs very tender. There is no rub or  CVA tenderness. There was tenderness in left middle and lower quadrants in the area of the sigmoid and descending colon. There is no fullness or rebound. Bowel sounds are normal active. Right upper and lower quadrants are unremarkable Rectal no stool in the rectum, mucous is Hemoccult negative Extremities  No pedal edema Skin No lesions Neurological Alert and oriented x 3 Psychological Normal mood and affect  Assessment and Plan:    Problem #1 Acute LUQ/costal margin pain in a patient who has severe scoliosis and had fusion  at  15 levels of her spine from T4-S1. On exam, the tenderness is  related to the ribs and laterally around  to the thoracic spine. posteriorly. At the same time, there isalso some discomfort in the left lower and middle quadrant suggesting either irritable bowel syndrome or symptomatic diverticulosis. It has been over 7 years since her last colonoscopy so we will go ahead and proceed with a colonoscopy this week to rule out the possibility of gastrointestinal problems. If the colonoscopy is nondiagnostic, I suggest that patient sees Dr.Cohen with respect to possible malfunction of the hardware in her back,  causing intercostal pain. She agrees with the plan .    Lafayette Dragon 04/02/2014

## 2014-04-02 NOTE — Patient Instructions (Addendum)
You have been scheduled for a colonoscopy with propofol. Please follow written instructions given to you at your visit today.  Please pick up your prep kit at the pharmacy within the next 1-3 days. If you use inhalers (even only as needed), please bring them with you on the day of your procedure. Your physician has requested that you go to www.startemmi.com and enter the access code given to you at your visit today. This web site gives a general overview about your procedure. However, you should still follow specific instructions given to you by our office regarding your preparation for the procedure.  Dr Cohen,Dr William Harris 

## 2014-04-05 ENCOUNTER — Other Ambulatory Visit: Payer: Self-pay | Admitting: *Deleted

## 2014-04-05 ENCOUNTER — Encounter: Payer: Self-pay | Admitting: Internal Medicine

## 2014-04-05 ENCOUNTER — Ambulatory Visit (AMBULATORY_SURGERY_CENTER): Payer: Medicare Other | Admitting: Internal Medicine

## 2014-04-05 ENCOUNTER — Telehealth: Payer: Self-pay | Admitting: *Deleted

## 2014-04-05 VITALS — BP 140/76 | HR 81 | Temp 98.0°F | Resp 16 | Ht 60.0 in | Wt 180.0 lb

## 2014-04-05 DIAGNOSIS — R1012 Left upper quadrant pain: Secondary | ICD-10-CM

## 2014-04-05 DIAGNOSIS — D126 Benign neoplasm of colon, unspecified: Secondary | ICD-10-CM

## 2014-04-05 MED ORDER — SODIUM CHLORIDE 0.9 % IV SOLN: 500.0000 mL | INTRAVENOUS | Status: DC

## 2014-04-05 NOTE — Op Note (Signed)
[facility]  [address] [place], [address]   COLONOSCOPY PROCEDURE REPORT  PATIENT: [name], Anna  MR#: [id number] BIRTHDATE: [birth year] , 67  yrs. old GENDER: Female ENDOSCOPIST: Lafayette Dragon, MD REFERRED CW:[id number] Kenton Kingfisher, M.D. PROCEDURE DATE:  04/05/2014 PROCEDURE:   Colonoscopy with biopsy First Screening Colonoscopy - Avg.  risk and is 50 yrs.  old or older - No.  Prior Negative Screening - Now for repeat screening. N/A  History of Adenoma - Now for follow-up colonoscopy & has been > or = to 3 yrs.  N/A  Polyps Removed Today? No.  Recommend repeat exam, <10 yrs? No. ASA CLASS:   Class II INDICATIONS:abdominal pain in the upper left quadrant. history of the severe scoliosis necessitating T4-S1 fusion. Prior colonoscopy in 2002 and 2008 showed deformed ileocecal valve MEDICATIONS: MAC sedation, administered by CRNA and Propofol (Diprivan) 260 mg IV  DESCRIPTION OF PROCEDURE:   After the risks benefits and alternatives of the procedure were thoroughly explained, informed consent was obtained.  A digital rectal exam revealed no abnormalities of the rectum.   The LB [id number]  endoscope was introduced through the anus and advanced to the cecum, which was identified by both the appendix and ileocecal valve. No adverse events experienced.   The quality of the prep was good, using MoviPrep  The instrument was then slowly withdrawn as the colon was fully examined.      COLON FINDINGS: A normal appearing cecum, ileocecal valve, and appendiceal orifice were identified..no evidence of the ileocecal valve deformity which was noted on the last colonoscopy. Biopsies were taken for ileocecal valve  The ascending, hepatic flexure, transverse, splenic flexure, descending, sigmoid colon and rectum appeared unremarkable.  No polyps or cancers were seen. Retroflexed views revealed no abnormalities. The time to cecum=9 minutes 06 seconds.  Withdrawal  time=8 minutes 27 seconds.  The scope was withdrawn and the procedure completed.nothing to account for left upper quadrant abdominal pain COMPLICATIONS: There were no complications.  ENDOSCOPIC IMPRESSION: Normal colon,nothing to account for left upper quadrant abdominal pain which may be musculoskeletal. Status post biopsies from ileocecal valve which appears normal on today's exam but appeared deformed on last exam in 2008 mild diverticulosis of the sigmoid colon  RECOMMENDATIONS: 1.  Await pathology results 2.  high-fiber diet Recall colonoscopy in 10 years [facility] to [facility] for reevaluation of spinal fusion   eSigned:  Lafayette Dragon, MD 04/05/2014 12:20 PM   cc:   PATIENT NAME:  Anna, [name] MR#: [id number]

## 2014-04-05 NOTE — Patient Instructions (Addendum)
YOU HAD AN ENDOSCOPIC PROCEDURE TODAY AT [facility]: Refer to the procedure report that was given to you for any specific questions about what was found during the examination.  If the procedure report does not answer your questions, please call your gastroenterologist to clarify.  If you requested that your care partner not be given the details of your procedure findings, then the procedure report has been included in a sealed envelope for you to review at your convenience later.  YOU SHOULD EXPECT: Some feelings of bloating in the abdomen. Passage of more gas than usual.  Walking can help get rid of the air that was put into your GI tract during the procedure and reduce the bloating. If you had a lower endoscopy (such as a colonoscopy or flexible sigmoidoscopy) you may notice spotting of blood in your stool or on the toilet paper. If you underwent a bowel prep for your procedure, then you may not have a normal bowel movement for a few days.  DIET: Your first meal following the procedure should be a light meal and then it is ok to progress to your normal diet.  A half-sandwich or bowl of soup is an example of a good first meal.  Heavy or fried foods are harder to digest and may make you feel nauseous or bloated.  Likewise meals heavy in dairy and vegetables can cause extra gas to form and this can also increase the bloating.  Drink plenty of fluids but you should avoid alcoholic beverages for 24 hours.  ACTIVITY: Your care partner should take you home directly after the procedure.  You should plan to take it easy, moving slowly for the rest of the day.  You can resume normal activity the day after the procedure however you should NOT DRIVE or use heavy machinery for 24 hours (because of the sedation medicines used during the test).    SYMPTOMS TO REPORT IMMEDIATELY: A gastroenterologist can be reached at any hour.  During normal business hours, 8:30 AM to 5:00 PM Monday through Friday,  call [phone].  After hours and on weekends, please call the GI answering service at [phone] who will take a message and have the physician on call contact you.   Following lower endoscopy (colonoscopy or flexible sigmoidoscopy):  Excessive amounts of blood in the stool  Significant tenderness or worsening of abdominal pains  Swelling of the abdomen that is new, acute  Fever of 100F or higher  FOLLOW UP: If any biopsies were taken you will be contacted by phone or by letter within the next 1-3 weeks.  Call your gastroenterologist if you have not heard about the biopsies in 3 weeks.  Our staff will call the home number listed on your records the next business day following your procedure to check on you and address any questions or concerns that you may have at that time regarding the information given to you following your procedure. This is a courtesy call and so if there is no answer at the home number and we have not heard from you through the emergency physician on call, we will assume that you have returned to your regular daily activities without incident.  Repeat colonoscopy in 10 years.  SIGNATURES/CONFIDENTIALITY: You and/or your care partner have signed paperwork which will be entered into your electronic medical record.  These signatures attest to the fact that that the information above on your After Visit Summary has been reviewed and is understood.  Full  responsibility of the confidentiality of this discharge information lies with you and/or your care-partner.  Read the handout about Diverticulosis.  Rollene Fare will call you with an appointment with Dr. Patrice Paradise.

## 2014-04-05 NOTE — Telephone Encounter (Signed)
Per, Dr. Olevia Perches, patient needs to see Dr. Patrice Paradise for left abdominal pain ? Scoliosis.[phone]) Left a message for his office to call us.

## 2014-04-05 NOTE — Progress Notes (Signed)
Called to room to assist during endoscopic procedure.  Patient ID and intended procedure confirmed with present staff. Received instructions for my participation in the procedure from the performing physician.  

## 2014-04-05 NOTE — Progress Notes (Signed)
Procedure ends, to recovery, report given and VSS. 

## 2014-04-08 ENCOUNTER — Telehealth: Payer: Self-pay | Admitting: *Deleted

## 2014-04-08 NOTE — Telephone Encounter (Signed)
Spoke with Dr. Towanda Malkin office and scheduled patient with Burlene Arnt, PA on 04/10/14 at 9:15 AM. Patient aware.

## 2014-04-08 NOTE — Telephone Encounter (Signed)
Left message that we called for f/u 

## 2014-04-09 ENCOUNTER — Encounter: Payer: Self-pay | Admitting: Internal Medicine

## 2014-05-24 ENCOUNTER — Ambulatory Visit (INDEPENDENT_AMBULATORY_CARE_PROVIDER_SITE_OTHER): Payer: Medicare Other | Admitting: Internal Medicine

## 2014-05-24 ENCOUNTER — Encounter: Payer: Self-pay | Admitting: Internal Medicine

## 2014-05-24 VITALS — BP 116/76 | HR 84 | Ht 59.0 in | Wt 177.0 lb

## 2014-05-24 DIAGNOSIS — K648 Other hemorrhoids: Secondary | ICD-10-CM

## 2014-05-24 DIAGNOSIS — K625 Hemorrhage of anus and rectum: Secondary | ICD-10-CM

## 2014-05-24 DIAGNOSIS — R319 Hematuria, unspecified: Secondary | ICD-10-CM

## 2014-05-24 MED ORDER — HYDROCORTISONE ACETATE 25 MG RE SUPP: 25.0000 mg | Freq: Every day | RECTAL | Status: DC

## 2014-05-24 NOTE — Progress Notes (Signed)
[name] 24-Jun-1946 [id number]  Note: This dictation was prepared with Dragon digital system. Any transcriptional errors that result from this procedure are unintentional.   History of Present Illness: This is a 68 year old white female with several days of low volume wall bright red blood per rectum which occurred while vacationing in [place] and after return.. Although she denies being constipated she did develop some diarrhea after return having about 6 bowel movements one after another. We had just completed a colonoscopy in May 2015 which was essentially a normal exam. Prior colonoscopies were in 2002 and 2008. She has been on Celebrex 200 mg daily. She has alower abdominal as well as upper abdominal discomfort while taking Celebrex 200 mg daily     Past Medical History  Diagnosis Date  . Breast cancer   . Hypertension   . GERD (gastroesophageal reflux disease)   . Headache(784.0)     migraines  . Arthritis   . UTI symptoms     on abx  . PONV (postoperative nausea and vomiting)   . Hyperlipidemia   . Crohn's disease   . Breast cancer   . DJD (degenerative joint disease)   . Squamous cell carcinoma   . Cystitis     Past Surgical History  Procedure Laterality Date  . Total hip arthroplasty Bilateral 1987, 1989, 1997    right  x 2  . Back fusion  2005, 2008, 2011    T4-S1  . Rotator cuff repair Right     x 2  . Breast lumpectomy Left   . Bunionectomy Bilateral   . Hysteroscopy w/d&c N/A 08/15/2013    Procedure: DILATATION AND CURETTAGE /HYSTEROSCOPY WITH RESECTION;  Surgeon: Princess Bruins, MD;  Location: [facility];  Service: Gynecology;  Laterality: N/A;  . Shoulder arthroscopy Left   . Tonsillectomy      x 4, kept growing back  . Thumb arthroscopy Right     left, cyst removal    Allergies  Allergen Reactions  . Sulfa Antibiotics Hives and Rash  . Tincture Of Benzoin [Benzoin] Itching    Burned skin  . Demerol [Meperidine] Itching and Nausea Only     Depressed breathing and slurred speach  . Hydrocodone Itching and Nausea And Vomiting  . Oxycodone Itching and Nausea And Vomiting  . Tramadol Itching and Nausea Only    Family history and social history have been reviewed.  Review of Systems: Negative for dysphagia nausea vomiting or melena  The remainder of the 10 point ROS is negative except as outlined in the H&P  Physical Exam: General Appearance Well developed, in no distress Eyes  Non icteric  HEENT  Non traumatic, normocephalic  Mouth No lesion, tongue papillated, no cheilosis Neck Supple without adenopathy, thyroid not enlarged, no carotid bruits, no JVD Lungs Clear to auscultation bilaterally COR Normal S1, normal S2, regular rhythm, no murmur, quiet precordium Abdomen Mild tenderness across upper abdomen normoactive bowel sounds  Rectal An anoscopic exam reveals normal perianal area. Normal rectal sphincter tone. At least 3 first grade internal hemorrhoids which appeared to be edematous hyperemic but there is no active bleeding. No evidence of proctitis  Extremities  No pedal edema Skin No lesions Neurological Alert and oriented x 3 Psychological Normal mood and affect  Assessment and Plan:   Low-volume rectal bleeding likely related to a first-grade internal hemorrhoids. The episode was precipitated by the bus trip and episode of diarrhea. It has now subsided. She will start Anusol-HC suppositories at bedtime and continue  high-fiber diet and use stool softeners when necessary Upper abdominal pain across the abdomen. Most likely gastritis from Celebrex. I have asked her to hold Celebrex at this point and his Protonix to 40 mg twice a day for 2 weeks and purchase Gaviscon tablets to take 1-2 as needed for abdominal pain if pain continues consider upper endoscopy  Hematuria. She was found to have microscopic hematuria by Dr.  Dellis [name], she just started on Macrodantin 100 mg daily. We will repeat her urinalysis in 2 weeks       [name] 05/24/2014

## 2014-05-24 NOTE — Patient Instructions (Addendum)
We have sent the following medications to your pharmacy for you to pick up at your convenience: Anusol [facility] suppositories-Insert 1 suppository into rectum at bedtime.  Please increase your Protonix to 40 mg twice daily x 2 weeks, then decrease back down to once daily.  Please purchase the following medications over the counter and take as directed: Gaviscon OTC- Chew 1-2 tablets every 4 hours as needed for abdominal pain  Please hold Celebrex x 1 week or until pain subsides.  Your physician has requested that you go to the basement for the following lab work before leaving today: Urinalysis

## 2014-05-27 ENCOUNTER — Telehealth: Payer: Self-pay | Admitting: Internal Medicine

## 2014-05-27 MED ORDER — PANTOPRAZOLE SODIUM 40 MG PO TBEC: 40.0000 mg | DELAYED_RELEASE_TABLET | Freq: Two times a day (BID) | ORAL | Status: DC

## 2014-05-27 NOTE — Telephone Encounter (Signed)
Rx sent for twice daily dosing. Patient advised.

## 2014-06-07 ENCOUNTER — Other Ambulatory Visit (INDEPENDENT_AMBULATORY_CARE_PROVIDER_SITE_OTHER): Payer: Medicare Other

## 2014-06-07 DIAGNOSIS — R319 Hematuria, unspecified: Secondary | ICD-10-CM

## 2014-06-07 LAB — URINALYSIS, ROUTINE W REFLEX MICROSCOPIC
BILIRUBIN URINE: NEGATIVE
Ketones, ur: NEGATIVE
LEUKOCYTES UA: NEGATIVE
NITRITE: NEGATIVE
Specific Gravity, Urine: 1.02 (ref 1.000–1.030)
Total Protein, Urine: NEGATIVE
UROBILINOGEN UA: 0.2 (ref 0.0–1.0)
Urine Glucose: NEGATIVE
WBC, UA: NONE SEEN (ref 0–?)
pH: 6 (ref 5.0–8.0)

## 2014-06-11 ENCOUNTER — Telehealth: Payer: Self-pay | Admitting: Internal Medicine

## 2014-06-11 NOTE — Telephone Encounter (Signed)
Patient is calling because her urine report says she has trace of blood and she was told it was normal. She wants to know what this means.

## 2014-06-12 NOTE — Telephone Encounter (Signed)
Spoke with patient and gave her Dr. Nichola Sizer explanation.

## 2014-06-12 NOTE — Telephone Encounter (Signed)
U/A shows 0-2 RBC's which is normal. The RBC's are lysed so it turns the  Blood indicator  Trace positive. It is normal to have 0-2 RBC's. In urine. If she is interested in pusuing it, I would repeat the U/aA  After she drinks lot of fluids

## 2014-06-12 NOTE — Telephone Encounter (Signed)
Left a message for patient to call back. 

## 2014-07-02 ENCOUNTER — Telehealth: Payer: Self-pay | Admitting: Internal Medicine

## 2014-07-02 ENCOUNTER — Encounter: Payer: Self-pay | Admitting: Internal Medicine

## 2014-07-02 ENCOUNTER — Ambulatory Visit (INDEPENDENT_AMBULATORY_CARE_PROVIDER_SITE_OTHER): Payer: Medicare Other | Admitting: Internal Medicine

## 2014-07-02 VITALS — BP 110/70 | HR 68 | Ht 60.0 in | Wt 178.4 lb

## 2014-07-02 DIAGNOSIS — R1013 Epigastric pain: Secondary | ICD-10-CM

## 2014-07-02 DIAGNOSIS — K2990 Gastroduodenitis, unspecified, without bleeding: Principal | ICD-10-CM

## 2014-07-02 DIAGNOSIS — K2970 Gastritis, unspecified, without bleeding: Secondary | ICD-10-CM

## 2014-07-02 MED ORDER — PANTOPRAZOLE SODIUM 40 MG PO TBEC: 40.0000 mg | DELAYED_RELEASE_TABLET | Freq: Two times a day (BID) | ORAL | Status: DC

## 2014-07-02 MED ORDER — SUCRALFATE 1 GM/10ML PO SUSP: 1.0000 g | Freq: Two times a day (BID) | ORAL | Status: DC

## 2014-07-02 NOTE — Progress Notes (Signed)
[name] 11-Jul-1946 [id number]  Note: This dictation was prepared with Dragon digital system. Any transcriptional errors that result from this procedure are unintentional.   History of Present Illness:  This is a 68 year old white female with epigastric pain which woke her up at night last night in the left upper quadrant as well as in the right upper quadrant. She has had a prior cholecystectomy. She was on Celebrex 200 mg daily until 5 weeks ago when she discontinued it. At that time, she increased Protonix to 40 mg twice a day and added Gaviscon. She is much better but is still having upper abdominal pain. She also complains of burning in the subxiphoid area and difficulty swallowing at times. She has had numerous colonoscopies. The last one in May 2015 was normal. She has never had an upper endoscopy. She denies taking any anti-inflammatory agents, only Tylenol. She denies being constipated.    Past Medical History  Diagnosis Date  . Breast cancer   . Hypertension   . GERD (gastroesophageal reflux disease)   . Headache(784.0)     migraines  . Arthritis   . UTI symptoms     on abx  . PONV (postoperative nausea and vomiting)   . Hyperlipidemia   . Crohn's disease   . Breast cancer   . DJD (degenerative joint disease)   . Squamous cell carcinoma   . Cystitis     Past Surgical History  Procedure Laterality Date  . Total hip arthroplasty Bilateral 1987, 1989, 1997    right  x 2  . Back fusion  2005, 2008, 2011    T4-S1  . Rotator cuff repair Right     x 2  . Breast lumpectomy Left   . Bunionectomy Bilateral   . Hysteroscopy w/d&c N/A 08/15/2013    Procedure: DILATATION AND CURETTAGE /HYSTEROSCOPY WITH RESECTION;  Surgeon: Princess Bruins, MD;  Location: [facility];  Service: Gynecology;  Laterality: N/A;  . Shoulder arthroscopy Left   . Tonsillectomy      x 4, kept growing back  . Thumb arthroscopy Right     left, cyst removal    Allergies  Allergen Reactions  .  Sulfa Antibiotics Hives and Rash  . Tincture Of Benzoin [Benzoin] Itching    Burned skin  . Demerol [Meperidine] Itching and Nausea Only    Depressed breathing and slurred speach  . Hydrocodone Itching and Nausea And Vomiting  . Oxycodone Itching and Nausea And Vomiting  . Tramadol Itching and Nausea Only    Family history and social history have been reviewed.  Review of Systems: Intermittent dysphagia reinstitute regurgitation. Reflux. Upper abdominal pain  The remainder of the 10 point ROS is negative except as outlined in the H&P  Physical Exam:, General Appearance Well developed, in no distress, overweight walks with a cane Eyes  Non icteric  HEENT  Non traumatic, normocephalic  Mouth No lesion, tongue papillated, no cheilosis Neck Supple without adenopathy, thyroid not enlarged, no carotid bruits, no JVD Lungs Clear to auscultation bilaterally COR Normal S1, normal S2, regular rhythm, no murmur, quiet precordium Abdomen very tender across upper abdomen in the left and right upper quadrants more so a left costal margin. Normal active bowel sounds. No rebound. Mild tenderness in left middle quadrant. No palpable mass. Liver edge at costal margin Rectal not repeated Extremities  No pedal edema Skin No lesions Neurological Alert and oriented x 3 Psychological Normal mood and affect  Assessment and Plan:   Problem #25 68 year old  white female with persistent epigastric discomfort after discontinuation of Celebrex 5 weeks ago. She will remain on Protonix 40 mg twice a day and we will add Carafate slurry 10 cc by mouth twice a day. We will proceed with an upper endoscopy and biopsies to rule out H. pylori. We need to rule out gastropathy, hiatal hernia or Peptic ulcer.    [name] 07/02/2014

## 2014-07-02 NOTE — Patient Instructions (Addendum)
You have been scheduled for an endoscopy. Please follow written instructions given to you at your visit today. If you use inhalers (even only as needed), please bring them with you on the day of your procedure. Your physician has requested that you go to www.startemmi.com and enter the access code given to you at your visit today. This web site gives a general overview about your procedure. However, you should still follow specific instructions given to you by our office regarding your preparation for the procedure.  We have sent the following medications to your pharmacy for you to pick up at your convenience: carafate slurry-10 ml twice daily pantoprazole   Dr Shirline Frees

## 2014-07-02 NOTE — Telephone Encounter (Signed)
Pilar Plate with [facility] requested a change to carafate tablets rather than slurry as there is $115 difference in the two. I have advised that the tablets will be fine if he will instruct patient how to make into a slurry. He states he will do this.

## 2014-07-03 ENCOUNTER — Other Ambulatory Visit: Payer: Medicare Other

## 2014-07-03 ENCOUNTER — Other Ambulatory Visit (INDEPENDENT_AMBULATORY_CARE_PROVIDER_SITE_OTHER): Payer: Medicare Other

## 2014-07-03 ENCOUNTER — Telehealth: Payer: Self-pay | Admitting: *Deleted

## 2014-07-03 ENCOUNTER — Encounter: Payer: Self-pay | Admitting: *Deleted

## 2014-07-03 ENCOUNTER — Encounter: Payer: Self-pay | Admitting: Internal Medicine

## 2014-07-03 ENCOUNTER — Ambulatory Visit (AMBULATORY_SURGERY_CENTER): Payer: Medicare Other | Admitting: Internal Medicine

## 2014-07-03 ENCOUNTER — Other Ambulatory Visit: Payer: Self-pay | Admitting: *Deleted

## 2014-07-03 VITALS — BP 138/71 | HR 79 | Temp 97.9°F | Resp 12 | Wt 178.0 lb

## 2014-07-03 DIAGNOSIS — R1013 Epigastric pain: Secondary | ICD-10-CM

## 2014-07-03 DIAGNOSIS — K2990 Gastroduodenitis, unspecified, without bleeding: Principal | ICD-10-CM

## 2014-07-03 DIAGNOSIS — D1330 Benign neoplasm of unspecified part of small intestine: Secondary | ICD-10-CM

## 2014-07-03 DIAGNOSIS — D131 Benign neoplasm of stomach: Secondary | ICD-10-CM

## 2014-07-03 DIAGNOSIS — R109 Unspecified abdominal pain: Secondary | ICD-10-CM

## 2014-07-03 DIAGNOSIS — K2970 Gastritis, unspecified, without bleeding: Secondary | ICD-10-CM

## 2014-07-03 LAB — HEPATIC FUNCTION PANEL
ALBUMIN: 4.1 g/dL (ref 3.5–5.2)
ALT: 18 U/L (ref 0–35)
AST: 22 U/L (ref 0–37)
Alkaline Phosphatase: 67 U/L (ref 39–117)
BILIRUBIN TOTAL: 0.8 mg/dL (ref 0.2–1.2)
Bilirubin, Direct: 0.1 mg/dL (ref 0.0–0.3)
Total Protein: 7.2 g/dL (ref 6.0–8.3)

## 2014-07-03 LAB — AMYLASE: AMYLASE: 64 U/L (ref 27–131)

## 2014-07-03 LAB — LIPASE: LIPASE: 26 U/L (ref 11.0–59.0)

## 2014-07-03 LAB — CREATININE, SERUM: CREATININE: 0.8 mg/dL (ref 0.4–1.2)

## 2014-07-03 MED ORDER — TRAMADOL HCL 50 MG PO TABS: 50.0000 mg | ORAL_TABLET | Freq: Four times a day (QID) | ORAL | Status: DC | PRN

## 2014-07-03 MED ORDER — SODIUM CHLORIDE 0.9 % IV SOLN: 500.0000 mL | INTRAVENOUS | Status: DC

## 2014-07-03 NOTE — Patient Instructions (Addendum)
Impressions/recommendations:  Retained secretions in the esophagus Multiple fundic gland polyps Enlarged biliary papilla  Evaluate with further testing MRCP, which will be arranged with Dr. Nichola Sizer office. If you have not heard from them by Friday 8/28, please call to inquire.  YOU HAD AN ENDOSCOPIC PROCEDURE TODAY AT [facility]: Refer to the procedure report that was given to you for any specific questions about what was found during the examination.  If the procedure report does not answer your questions, please call your gastroenterologist to clarify.  If you requested that your care partner not be given the details of your procedure findings, then the procedure report has been included in a sealed envelope for you to review at your convenience later.  YOU SHOULD EXPECT: Some feelings of bloating in the abdomen. Passage of more gas than usual.  Walking can help get rid of the air that was put into your GI tract during the procedure and reduce the bloating. If you had a lower endoscopy (such as a colonoscopy or flexible sigmoidoscopy) you may notice spotting of blood in your stool or on the toilet paper. If you underwent a bowel prep for your procedure, then you may not have a normal bowel movement for a few days.  DIET: Your first meal following the procedure should be a light meal and then it is ok to progress to your normal diet.  A half-sandwich or bowl of soup is an example of a good first meal.  Heavy or fried foods are harder to digest and may make you feel nauseous or bloated.  Likewise meals heavy in dairy and vegetables can cause extra gas to form and this can also increase the bloating.  Drink plenty of fluids but you should avoid alcoholic beverages for 24 hours.  ACTIVITY: Your care partner should take you home directly after the procedure.  You should plan to take it easy, moving slowly for the rest of the day.  You can resume normal activity the day after the  procedure however you should NOT DRIVE or use heavy machinery for 24 hours (because of the sedation medicines used during the test).    SYMPTOMS TO REPORT IMMEDIATELY: A gastroenterologist can be reached at any hour.  During normal business hours, 8:30 AM to 5:00 PM Monday through Friday, call [phone].  After hours and on weekends, please call the GI answering service at [phone] who will take a message and have the physician on call contact you.    Following upper endoscopy (EGD)  Vomiting of blood or coffee ground material  New chest pain or pain under the shoulder blades  Painful or persistently difficult swallowing  New shortness of breath  Fever of 100F or higher  Black, tarry-looking stools  FOLLOW UP: If any biopsies were taken you will be contacted by phone or by letter within the next 1-3 weeks.  Call your gastroenterologist if you have not heard about the biopsies in 3 weeks.  Our staff will call the home number listed on your records the next business day following your procedure to check on you and address any questions or concerns that you may have at that time regarding the information given to you following your procedure. This is a courtesy call and so if there is no answer at the home number and we have not heard from you through the emergency physician on call, we will assume that you have returned to your regular daily activities without incident.  SIGNATURES/CONFIDENTIALITY: You and/or your care partner have signed paperwork which will be entered into your electronic medical record.  These signatures attest to the fact that that the information above on your After Visit Summary has been reviewed and is understood.  Full responsibility of the confidentiality of this discharge information lies with you and/or your care-partner.

## 2014-07-03 NOTE — Op Note (Addendum)
[facility]  [address] [place], [address]   ENDOSCOPY PROCEDURE REPORT  PATIENT: [name], [name]  MR#: [id number] BIRTHDATE: 22-Nov-1945 , 67  yrs. old GENDER: Female ENDOSCOPIST: Lafayette Dragon, MD REFERRED BY:   Dr Shirline Frees           -Tennis Must PROCEDURE DATE:  07/03/2014 PROCEDURE:  EGD w/ biopsy ASA CLASS:     Class II INDICATIONS:  upper abdominal pain wakes her up at night.  Has been on Celebrex.  PPI has not relieved the pain. MEDICATIONS: MAC sedation, administered by CRNA and propofol (Diprivan) 200mg  IV TOPICAL ANESTHETIC: none  DESCRIPTION OF PROCEDURE: After the risks benefits and alternatives of the procedure were thoroughly explained, informed consent was obtained.  The LB [id number] endoscope was introduced through the mouth and advanced to the second portion of the duodenum. Without limitations.  The instrument was slowly withdrawn as the mucosa was fully examined.      Esophagus: there was a large amount of retained secretions in the esophagus. There were all clear with irrigation. The squamocolumnar junction was normal. There was no stricture or esophagitis. The squamocolumnar junction was normal  Stomach: multiple fundic gland polyps in the body of the stomach and gastric cardia. Antrum and pyloric outlet were normal. Biopsies were obtained to rule out H. pylori. Retroflexion of the endoscope confirmed the presence of multiple polyps  Duodenum: duodenal bulb was normal. There was an abnormal appearing biliary   papilla which had an appearance of a cauliflower and consisted of  multilobulated soft tissue which was biopsied extensively. There was no ulceration or friability at the papilla,. Second portion duodenum was otherwise normal unremarkable. I could not appreciate any bile in the descending duodenum[          The scope was then withdrawn from the patient and the procedure completed.  COMPLICATIONS: There were no  complications. ENDOSCOPIC IMPRESSION: 1. retained secretions in the esophagus 2. gastric biopsies to rule out H. pylori 3. multiple fundic gland polyps 4. enlarged biliary papilla possibly normal variation versus neoplasm. Multiple biopsies of pain RECOMMENDATIONS: 1.  Await pathology results 2.  MRCP, LFT's,amylase ,lipase  REPEAT EXAM: for EGD pending biopsy results.  eSigned:  Lafayette Dragon, MD 07/03/2014 11:55 AM Revised: 07/03/2014 11:55 AM  CC:  PATIENT NAME:  [name], [name] MR#: [id number]

## 2014-07-03 NOTE — Progress Notes (Signed)
Report to PACU, RN, vss, BBS= Clear.  

## 2014-07-03 NOTE — Telephone Encounter (Signed)
Scheduled MRCP at [facility] radiology on 07/08/14 at 3:00 PM. NPO 4 hours prior. Creatinine to be added to labs today.(add on sheet faxed) Spoke with patient and gave her appointment date, time and instructions. Patient has had hip replacement x 2, shoulder replacement and rod and screws long segment fusion. Called [facility] and spoke with Heidi. This should be fine for MRI.

## 2014-07-03 NOTE — Progress Notes (Signed)
Called to room to assist during endoscopic procedure.  Patient ID and intended procedure confirmed with present staff. Received instructions for my participation in the procedure from the performing physician.  

## 2014-07-05 ENCOUNTER — Telehealth: Payer: Self-pay | Admitting: Internal Medicine

## 2014-07-05 NOTE — Telephone Encounter (Signed)
Left a message for patient to call back. 

## 2014-07-08 ENCOUNTER — Ambulatory Visit (HOSPITAL_COMMUNITY)
Admission: RE | Admit: 2014-07-08 | Discharge: 2014-07-08 | Disposition: A | Discharge status: Home / Self Care | Payer: Medicare Other | Source: Ambulatory Visit | Attending: Internal Medicine | Admitting: Internal Medicine

## 2014-07-08 ENCOUNTER — Encounter: Payer: Self-pay | Admitting: Internal Medicine

## 2014-07-08 ENCOUNTER — Other Ambulatory Visit: Payer: Self-pay | Admitting: Internal Medicine

## 2014-07-08 DIAGNOSIS — Z853 Personal history of malignant neoplasm of breast: Secondary | ICD-10-CM | POA: Diagnosis not present

## 2014-07-08 DIAGNOSIS — R109 Unspecified abdominal pain: Secondary | ICD-10-CM

## 2014-07-08 DIAGNOSIS — K5710 Diverticulosis of small intestine without perforation or abscess without bleeding: Secondary | ICD-10-CM | POA: Diagnosis not present

## 2014-07-08 MED ORDER — GADOBENATE DIMEGLUMINE 529 MG/ML IV SOLN
16.0000 mL | Freq: Once | INTRAVENOUS | Status: AC | PRN
  Administered 2014-07-08: 16 mL via INTRAVENOUS

## 2014-07-09 NOTE — Telephone Encounter (Signed)
Left a message for patient to call back. 

## 2014-07-09 NOTE — Telephone Encounter (Signed)
She should follow up in about 8 weeks, give it a chance for the pain to go away. All tests at the moment are negative.

## 2014-07-09 NOTE — Telephone Encounter (Signed)
Patient given results. She is asking if she needs a f/u OV. States she still hurts. Please,advise

## 2014-07-10 ENCOUNTER — Telehealth: Payer: Self-pay | Admitting: Internal Medicine

## 2014-07-10 ENCOUNTER — Encounter: Payer: Self-pay | Admitting: *Deleted

## 2014-07-10 NOTE — Telephone Encounter (Signed)
Left a message for patient to call back. 

## 2014-07-10 NOTE — Telephone Encounter (Signed)
Spoke with patient and gave her Dr. Brodie's recommendation.  

## 2014-07-10 NOTE — Telephone Encounter (Signed)
Patient states her gums are sore and she has blisters in her mouth and on tongue. She is asking if the Carafate might cause this. Discussed that it is known to cause dry mouth. She also states she wondered about the contrast she drank on Monday as a cause but she has had contrast in the past without problems. She will stop the Carafate and she if symptoms go away.

## 2014-07-10 NOTE — Telephone Encounter (Signed)
Patient given recommendations. 

## 2014-07-10 NOTE — Telephone Encounter (Signed)
Carafate has Aluminum in it.May be she is allergic to it. Stop Carafate.

## 2014-07-29 ENCOUNTER — Ambulatory Visit: Payer: Medicare Other | Admitting: Podiatry

## 2014-08-12 ENCOUNTER — Ambulatory Visit (INDEPENDENT_AMBULATORY_CARE_PROVIDER_SITE_OTHER): Payer: Medicare Other | Admitting: Podiatry

## 2014-08-12 ENCOUNTER — Encounter: Payer: Self-pay | Admitting: Podiatry

## 2014-08-12 DIAGNOSIS — B351 Tinea unguium: Secondary | ICD-10-CM

## 2014-08-12 DIAGNOSIS — Z79899 Other long term (current) drug therapy: Secondary | ICD-10-CM

## 2014-08-12 MED ORDER — TERBINAFINE HCL 250 MG PO TABS: 250.0000 mg | ORAL_TABLET | Freq: Every day | ORAL | Status: DC

## 2014-08-12 NOTE — Progress Notes (Signed)
She presents today she denies fever chills nausea vomiting muscle aches and pains associated with the use of Lamisil. She states that I cannot see any changes as of yet.  Objective: No change in onychomycosis yet.  Assessment: Onychomycosis.  Plan: Started her on her 90 day dose of Lamisil blood work was requested as well. Should this come back abnormal I will notify her immediately.

## 2014-08-23 ENCOUNTER — Other Ambulatory Visit: Payer: Self-pay

## 2014-10-02 ENCOUNTER — Other Ambulatory Visit: Payer: Self-pay | Admitting: *Deleted

## 2014-10-02 MED ORDER — PANTOPRAZOLE SODIUM 40 MG PO TBEC: 40.0000 mg | DELAYED_RELEASE_TABLET | Freq: Two times a day (BID) | ORAL | Status: DC

## 2014-10-09 IMAGING — RF DG MYELOGRAM 2+ REGIONS
11 of 23 series · 11 of 23 positions shown · IV contrast (omnipaque)
Comparison: Lumbar myelogram 07/14/2010.  MRI of the cervical
spine 05/31/2011.

CLINICAL DATA: Low back pain extending to the anterior right thigh
and right foot.  Thoracolumbar pain radiating anteriorly to the
abdomen.  Scoliosis.  Status post extensive thoracolumbar fusion.

MYELOGRAM INJECTION
TECHNIQUE: Informed consent was obtained from the patient prior to
the procedure, including potential complications of headache,
allergy, infection and pain.  A timeout procedure was performed.
With the patient prone, the lower back was prepped with Betadine.
1% Lidocaine was used for local anesthesia.  Lumbar puncture was
performed at the L5 level using a 22 gauge needle with return of
slightly blood tinged CSF.  10 ml of Omnipaque 133was injected into
the subarachnoid space .
TECHNIQUE: I personally performed the lumbar puncture and
administered the intrathecal contrast. I also personally supervised
acquisition of the myelogram images. Following injection of
intrathecal Omnipaque contrast, spine imaging in multiple
projections was performed using fluoroscopy.
Fluoroscopy Time: 3 minutes 42 seconds
TECHNIQUE: CT imaging of the thoracic spine was performed after
intrathecal contrast administration.  Multiplanar CT image
reconstructions were also generated.
TECHNIQUE: CT imaging of the lumbar spine was performed after

[Series 2: (hospital) · 1 of 1 slices shown]
[im 1/1]
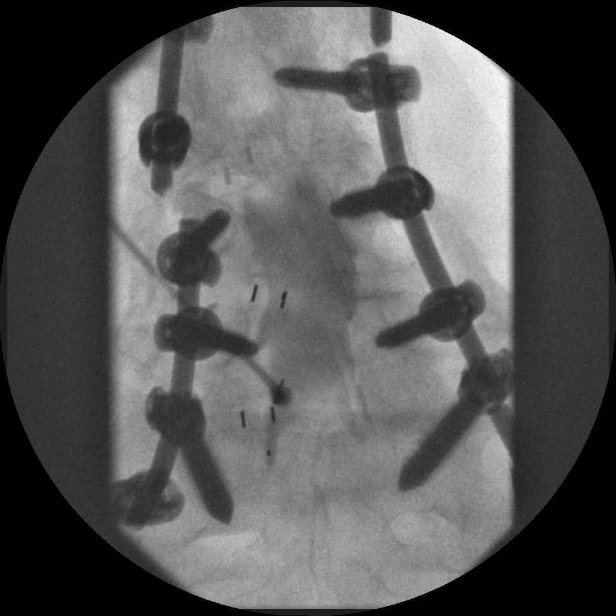

[Series 4: myelogram  white · 1 of 1 slices shown (1 of 9)]
[im 1/1]
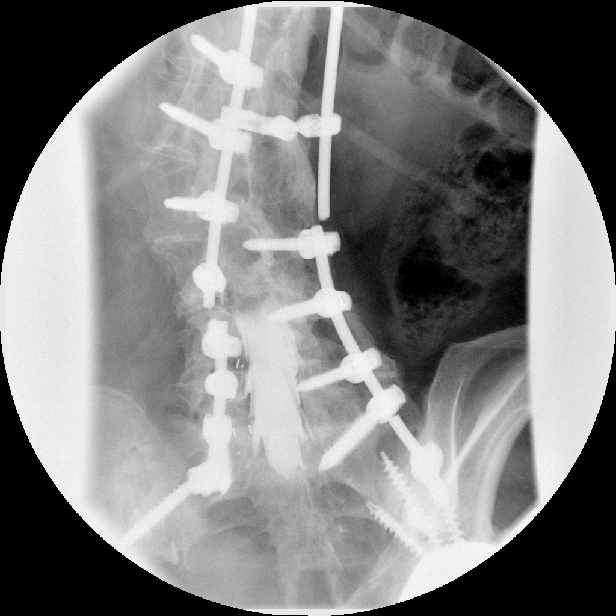

[Series 6: myelogram  white · 1 of 1 slices shown (2 of 9)]
[im 1/1]
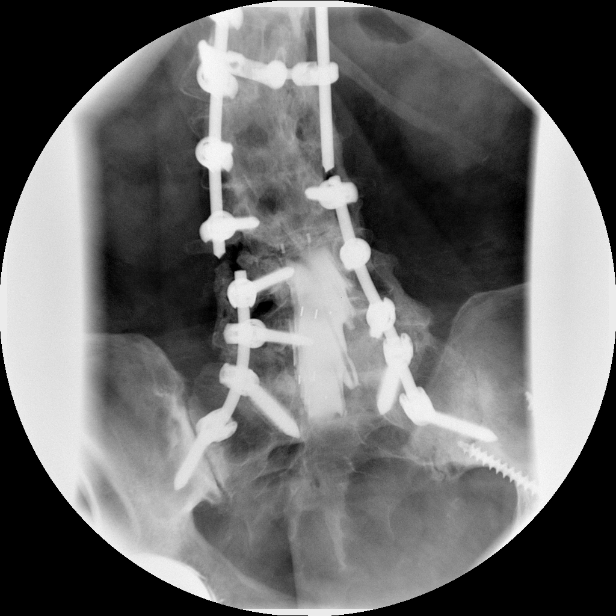

[Series 8: myelogram  white · 1 of 1 slices shown (3 of 9)]
[im 1/1]
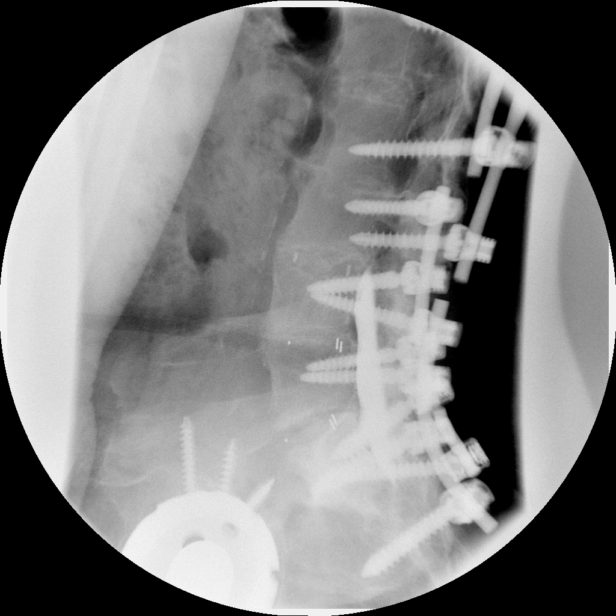

[Series 11: myelogram  white · 1 of 1 slices shown (4 of 9)]
[im 1/1]
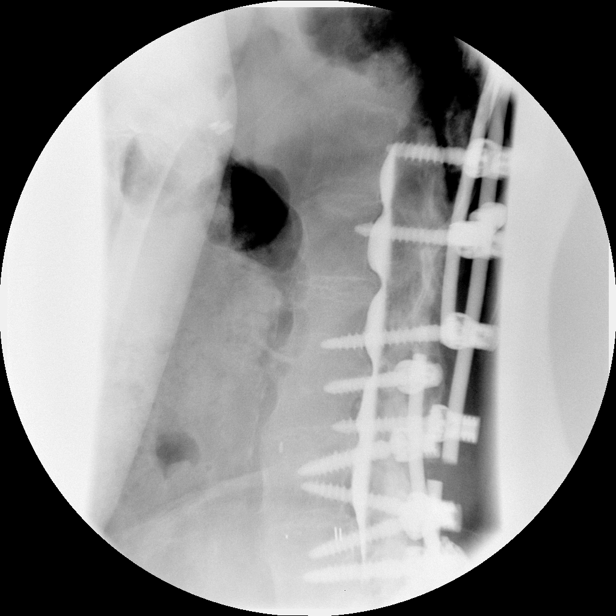

[Series 13: myelogram  white · 1 of 1 slices shown (5 of 9)]
[im 1/1]
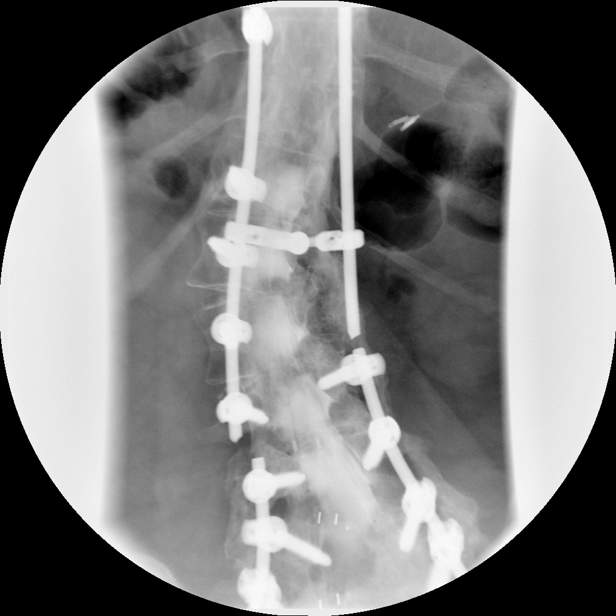

[Series 15: myelogram  white · 1 of 1 slices shown (6 of 9)]
[im 1/1]
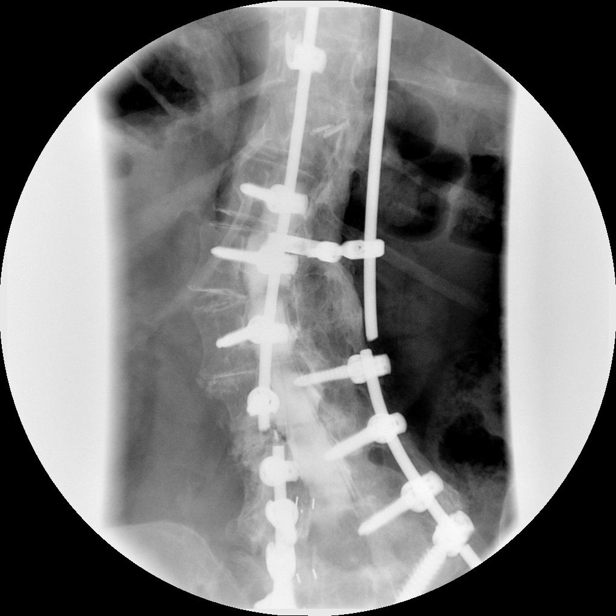

[Series 17: myelogram  white · 1 of 1 slices shown (7 of 9)]
[im 1/1]
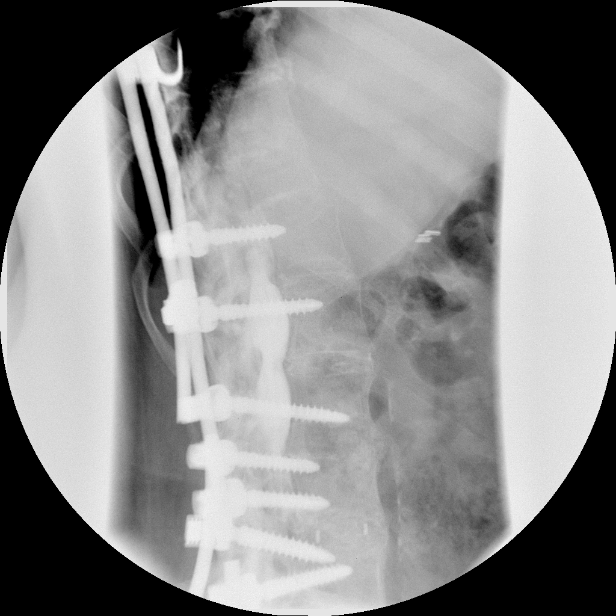

[Series 19: myelogram  white · 1 of 1 slices shown (8 of 9)]
[im 1/1]
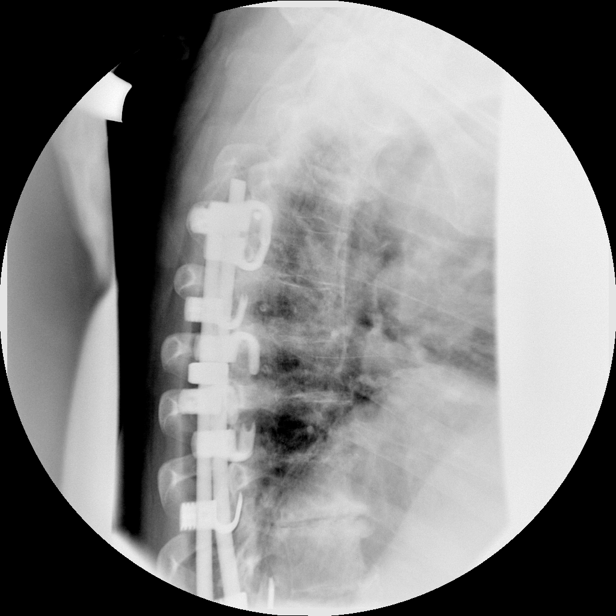

[Series 21: myelogram  white · 1 of 1 slices shown (9 of 9)]
[im 1/1]
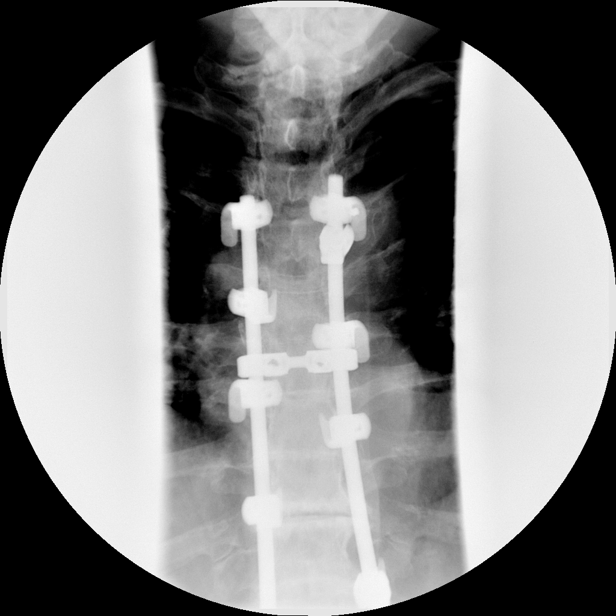

[Series 1002: view not recorded · 0.20mm/px · 1 of 1 slices shown]
[im 1/1]
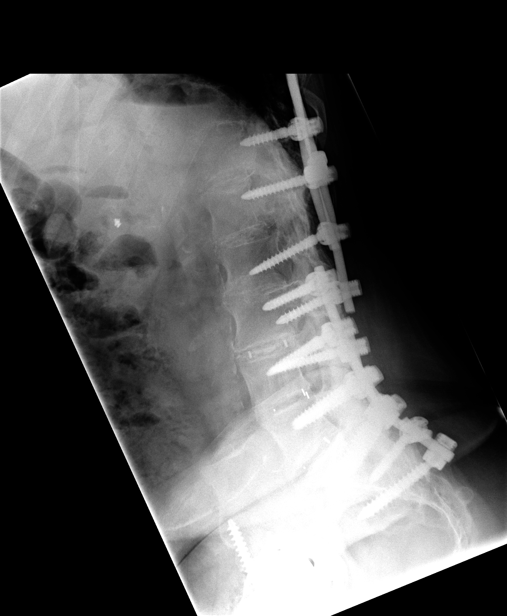

[11 of 23 positions shown; findings below may reference images not displayed]

IMPRESSION: Successful injection of  intrathecal contrast for myelography.

MYELOGRAM THORACIC AND LUMBAR
FINDINGS: Extensive thoracolumbar fusion is again noted.  The
hardware at L4-5 and L5-S1 has been revised with more extensive
sacral screws.  The rod extends to the L3 screw on the right.  A
separate rod begins at L3 on the left.  The screws appear well
seated.

Central canal stenosis and right lateral recess narrowing in
particular has progressed at to L3-4 and L2-3.  Laminar hooks and
rods extend superiorly to the level of T4.

Curvature of the lumbar spine is centered to the left at L1-2.

The hardware is intact.  Contrast is fairly faint within the
thoracic spine.  There is significant some loss of disc height to
T9-2 and T10-11.  Chronic end plate changes are present at T8-9.
Alignment is grossly anatomic.
IMPRESSION: 1.  Progressive central canal and right lateral recess stenosis at
L2-3 and L3-4.
2.  Stable scoliosis centered to the left at L1-2.
3.  Chronic loss of disc height in the lower thoracic spine.
Please see the CT report for further detail.


CT MYELOGRAPHY THORACIC SPINE
FINDINGS: The thoracic spine is imaged from C6-7 through L1-2.
Bilateral posterior rod fixation extends superiorly to the level of
T4 with a superior laminar hook on the right and above the inferior
and superior laminar looks on the left.

There is fusion across the disc space at T1-2, likely acquired.
Mild osseous foraminal narrowing is present bilaterally.

T2-3:  Degenerative anterolisthesis is present.  There is
associated facet hypertrophy.  Mild foraminal narrowing is worse on
the left.

T3-4:  Negative.

T4-5:  Mild osseous foraminal narrowing is present on the left.

T5-6:  There is fusion across the disc space.  Mild osseous
foraminal narrowing is present bilaterally.

T6-7:  There is fusion across the disc space.  No significant
stenosis is present.

T7-8:  There is fusion across the disc space.  Asymmetric left-
sided facet hypertrophy and a laminar hook result in minimal left
foraminal narrowing.

T8-9:  Asymmetric right-sided facet hypertrophy is present.  The
laminar hook on the right extends into the right T7-8 facet joint.
A vacuum disc is present.  Chronic end plate changes are noted.
The superior articulating facet of T9 on the right is displaced
into the canal and potentially contacts the posterior aspect of the
cord.  Mild to moderate right foraminal stenosis is present. The
findings have progressed since the prior study.

T9-10:  A lateral hook is attached to the transverse process,
lateral to the facet joint.  Mild facet degenerative changes are
present on the left.  This results in mild left foraminal
narrowing.

T10-11:  Moderate osseous foraminal narrowing is present on the
left.

T11-12:  Asymmetric right-sided facet hypertrophy is present.  Mild
right foraminal narrowing is evident.

T12-L1:  Mild facet hypertrophy is evident bilaterally.  There is
no significant stenosis.
IMPRESSION: 1.  Mild to moderate progressive right foraminal stenosis at T8-9
secondary to displacement of the superior articulating facet and
progressive endplate changes.
2.  The right laminar hook extends into the facet joint of T8-9 and
displaces the superior articulating process, potentially contacting
the posterolateral aspect of the cord.
3.  Fusion across the disc space at multiple levels as described.
4.  No significant endplate changes are at T8-9 with vacuum disc
and subchondral cysts.
5.  Mild osseous foraminal narrowing at T1-2, T2-3, T4-5, and T5-6.
6.  Minimal left foraminal narrowing at T7-8.
7.  Mild left foraminal narrowing at T9-10.
8.  Moderate osseous foraminal narrowing on the left at T10-11.
9.  Mild right foraminal narrowing at T11-12.


CT MYELOGRAPHY LUMBAR SPINE
FINDINGS: Lumbar spine is imaged through S3-4.  Pedicle screws and
rods are in place.  There is no lucency associated with the S1
pedicle screws on today's exam.  Bilateral sacral screws are also
evident.  The conus medullaris terminates at L2, within normal
limits.  Levoconvex scoliosis centered at L1-2 is not significantly
changed.

L1-2:  Asymmetric right-sided facet hypertrophy is present.  There
is solid osseous fusion anteriorly and posteriorly. Mild right
lateral recess and moderate foraminal stenosis is stable.

L2-3:  Asymmetric right-sided facet hypertrophy is evident.  There
is fusion anteriorly and posteriorly.  Right-sided clumping of
nerve roots is more consolidated than on the prior study.  The
central canal and foramen are patent.

L3-4:  Clumping of right-sided nerve roots is evident.  The patient
is fused anteriorly and posteriorly.  No significant stenosis is
evident.

L4-5:  Asymmetric right-sided facet hypertrophy is present.  A left
foraminotomy has been performed.  No residual stenosis is present.

L5-S1:  The facet joint is now fused.  Mild right lateral recess
narrowing is more prominent than on the prior exam.  Mild right
foraminal narrowing is similar.
IMPRESSION: 1.  Clumping of right-sided nerve roots at L1-2, particularly at L2-
3 and to a lesser extent at L3-4 simulated disc disease on the
conventional myelographic images.  This most likely represents
arachnoiditis.
2.  The posterior elements are now fused at L5-S1 without residual
lucency surrounding the S1 screws.
3.  Osseous spurring on the right at L5-S1 results in new mild
right lateral recess narrowing.
4.  No other significant change or progression of stenosis is
evident.

## 2014-10-22 ENCOUNTER — Ambulatory Visit
Admission: RE | Admit: 2014-10-22 | Discharge: 2014-10-22 | Disposition: A | Discharge status: Home / Self Care | Payer: Medicare Other | Source: Ambulatory Visit | Attending: Family Medicine | Admitting: Family Medicine

## 2014-10-22 ENCOUNTER — Other Ambulatory Visit: Payer: Self-pay | Admitting: Family Medicine

## 2014-10-22 DIAGNOSIS — W19XXXA Unspecified fall, initial encounter: Secondary | ICD-10-CM

## 2014-11-20 ENCOUNTER — Ambulatory Visit: Payer: Medicare Other | Admitting: Podiatry

## 2014-11-27 ENCOUNTER — Ambulatory Visit (INDEPENDENT_AMBULATORY_CARE_PROVIDER_SITE_OTHER): Payer: Medicare Other | Admitting: Podiatry

## 2014-11-27 VITALS — BP 129/75 | HR 98 | Resp 16

## 2014-11-27 DIAGNOSIS — Z79899 Other long term (current) drug therapy: Secondary | ICD-10-CM

## 2014-11-27 DIAGNOSIS — M79673 Pain in unspecified foot: Secondary | ICD-10-CM

## 2014-11-27 DIAGNOSIS — B351 Tinea unguium: Secondary | ICD-10-CM

## 2014-11-27 MED ORDER — TERBINAFINE HCL 250 MG PO TABS: ORAL_TABLET | ORAL | Status: DC

## 2014-11-27 NOTE — Progress Notes (Signed)
She presents today for follow-up of her onychomycosis and a chief complaint of painful elongated toenails. She states that the toenails appear to be doing much better but she would like to have them cut.  Objective: Vital signs are stable she is alert and oriented 3. She has some tenderness on palpation medial calcaneal tubercle of the left heel that is resulting in some of her lateral pain I offered her an injection but she declined. Her nails appear to be healing from proximal to distal and growing out more clearly. However they are still thick yellow dystrophic and clinically mycotic.  Assessment: Pain in limb secondary to onychomycosis 1 through 5 bilateral. Long-term therapy with Lamisil secondary to onychomycosis.  Plan: Offered her an injection to her left heel today which she declined. Debrided nails 1 through 5 bilateral covers are secondary to pain. We will keep her on Lamisil for another 60 days one tablet every other day. Follow up with her in 3 months.

## 2015-02-14 ENCOUNTER — Other Ambulatory Visit: Payer: Self-pay

## 2015-02-14 DIAGNOSIS — Z1231 Encounter for screening mammogram for malignant neoplasm of breast: Secondary | ICD-10-CM

## 2015-02-24 ENCOUNTER — Ambulatory Visit: Payer: Medicare Other

## 2015-02-26 ENCOUNTER — Ambulatory Visit: Payer: Self-pay

## 2015-03-05 ENCOUNTER — Ambulatory Visit (INDEPENDENT_AMBULATORY_CARE_PROVIDER_SITE_OTHER): Payer: Medicare Other | Admitting: Podiatry

## 2015-03-05 DIAGNOSIS — B351 Tinea unguium: Secondary | ICD-10-CM | POA: Diagnosis not present

## 2015-03-05 DIAGNOSIS — M79673 Pain in unspecified foot: Secondary | ICD-10-CM

## 2015-03-05 DIAGNOSIS — Z9181 History of falling: Secondary | ICD-10-CM

## 2015-03-05 NOTE — Progress Notes (Signed)
She presents today with a chief complaint of painful elongated toenails.  Objective: Vital signs are stable alert and oriented 3 pulses palpable nails are thick yellow dystrophic mycotic and painful palpation.  Assessment: Pain in limb secondary to onychomycosis 1 through 5 bilateral.  Plan: Debridement of nails 1 through 5 bilateral.

## 2015-03-11 ENCOUNTER — Ambulatory Visit
Admission: RE | Admit: 2015-03-11 | Discharge: 2015-03-11 | Disposition: A | Discharge status: Home / Self Care | Payer: Medicare Other | Source: Ambulatory Visit

## 2015-03-11 DIAGNOSIS — Z1231 Encounter for screening mammogram for malignant neoplasm of breast: Secondary | ICD-10-CM

## 2015-04-04 ENCOUNTER — Ambulatory Visit: Payer: Self-pay | Admitting: Orthopedic Surgery

## 2015-04-16 ENCOUNTER — Telehealth: Payer: Self-pay | Admitting: Interventional Cardiology

## 2015-04-16 ENCOUNTER — Ambulatory Visit: Payer: Self-pay | Admitting: Orthopedic Surgery

## 2015-04-16 ENCOUNTER — Telehealth: Payer: Self-pay

## 2015-04-16 NOTE — Telephone Encounter (Signed)
Signed cardiac clearance placed in MR nurse fax box to be faxed to [facility]

## 2015-04-16 NOTE — Telephone Encounter (Signed)
Follow Up   Pt is scheduled for L hip revision Tuesday 6/14 and is awaiting clearance. Pt states appointment will be cancelled if clearance is not received today.   Fax Number: Cecille Rubin at [employer] [facility] [employer] [phone]

## 2015-04-16 NOTE — Telephone Encounter (Signed)
Returned pt call. Adv her that cardiac clearance for her sx with [facility] has been faxed to them today. Pt thanked me and verbalized understanding.

## 2015-04-16 NOTE — H&P (Signed)
TOTAL HIP REVISION ADMISSION H&P  Patient is admitted for left revision total hip arthroplasty.  Subjective:  Chief Complaint: left hip pain  HPI: [name], 69 y.o. 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 The indications for the revision total hip arthroplasty are bearing surface wear leading to  symptomatic synovitis and hip instability.  Onset of symptoms was gradual starting 1 years ago with gradually worsening course since that time.  Prior procedures on the left hip include arthroplasty.  Patient currently rates pain in the left hip at 10 out of 10 with activity.  There is pain that interfers with activities of daily living and instability. Patient has evidence of osteolysis and eccentric poly wear by imaging studies.  This condition presents safety issues increasing the risk of falls.  There is no current active infection.  Patient Active Problem List   Diagnosis Date Noted  . ADENOCARCINOMA, BREAST, LEFT 04/23/2008  . HYPERCHOLESTEROLEMIA 04/23/2008  . HYPERTENSION 04/23/2008  . TRANSIENT ISCHEMIC ATTACK 04/23/2008  . ARTHRITIS 04/23/2008  . 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 DEEP VENOUS THROMBOPHLEBITIS, HX OF 04/23/2008  . HEADACHE, CHRONIC, HX OF 04/23/2008  . DIVERTICULOSIS, COLON 12/15/2006  . BENIGN NEOPLASM OF ADRENAL GLAND 10/25/2006   Past Medical History  Diagnosis Date  . Breast cancer   . Hypertension   . GERD (gastroesophageal reflux disease)   . Headache(784.0)     migraines  . Arthritis   . UTI symptoms     on abx  . PONV (postoperative nausea and vomiting)   . Hyperlipidemia   . Crohn's disease   . Breast cancer   . DJD (degenerative joint disease)   . Squamous cell carcinoma   . Cystitis     Past Surgical History  Procedure Laterality Date  . Total hip arthroplasty Bilateral 1987, 1989, 1997    right  x 2  . Back fusion   2005, 2008, 2011    T4-S1  . Rotator cuff repair Right     x 2  . Breast lumpectomy Left   . Bunionectomy Bilateral   . Hysteroscopy w/d&c N/A 08/15/2013    Procedure: DILATATION AND CURETTAGE /HYSTEROSCOPY WITH RESECTION;  Surgeon: Princess Bruins, MD;  Location: [facility];  Service: Gynecology;  Laterality: N/A;  . Shoulder arthroscopy Left   . Tonsillectomy      x 4, kept growing back  . Thumb arthroscopy Right     left, cyst removal  . Cholecystectomy       (Not in a hospital admission) Allergies  Allergen Reactions  . Sulfa Antibiotics Hives and Rash  . Tincture Of Benzoin [Benzoin] Itching    Burned skin  . Demerol [Meperidine] Itching and Nausea Only    Depressed breathing and slurred speach  . Hydrocodone Itching and Nausea And Vomiting  . Oxycodone Itching and Nausea And Vomiting    History  Substance Use Topics  . Smoking status: Never Smoker   . Smokeless tobacco: Never Used  . Alcohol Use: Yes     Comment: 1 glass weekly    Family History  Problem Relation Age of Onset  . Breast cancer Mother   . Hypertension Father   . Heart attack Father   . Hyperlipidemia Father   . Colon cancer Father   . Hypertension Sister   . Breast cancer Maternal Grandmother   . Stroke Maternal Grandfather   .  Heart attack Paternal Grandmother   . Cervical cancer Paternal Grandmother   . Heart attack Paternal Grandfather       Review of Systems  Constitutional: Negative.   HENT: Negative.   Eyes: Negative.   Respiratory: Negative.   Cardiovascular: Negative.   Gastrointestinal: Negative.   Genitourinary: Negative.   Musculoskeletal: Positive for back pain and joint pain.  Skin: Negative.   Neurological: Negative.   Endo/Heme/Allergies: Negative.   Psychiatric/Behavioral: Negative.     Objective:  Physical Exam  Constitutional: She is oriented to person, place, and time. She appears well-developed and well-nourished.  HENT:  Head: Normocephalic and atraumatic.   Eyes: Conjunctivae and EOM are normal. Pupils are equal, round, and reactive to light.  Neck: Normal range of motion. Neck supple.  Cardiovascular: Normal rate, regular rhythm, normal heart sounds and intact distal pulses.   Respiratory: Effort normal and breath sounds normal.  GI: Soft. Bowel sounds are normal.  Genitourinary:  deferred  Musculoskeletal:       Left hip: She exhibits decreased range of motion.  Neurological: She is alert and oriented to person, place, and time. She has normal reflexes.  Skin: Skin is warm and dry.  Psychiatric: She has a normal mood and affect. Her behavior is normal. Judgment and thought content normal.    Vital signs in last 24 hours: @VSRANGES @   Labs:   Estimated body mass index is 34.76 kg/(m^2) as calculated from the following:   Height as of 07/02/14: 5' (1.524 m).   Weight as of 07/03/14: 80.74 kg (178 lb).  Imaging Review:  There is osteolysis of the acetabular cup.The bone quality appears to be adequate for age and reported activity level. There is eccentric poly wear.  Assessment/Plan:  End stage arthritis, left hip(s) with failed previous arthroplasty.  The patient history, physical examination, clinical judgement of the provider and imaging studies are consistent with end stage degenerative joint disease of the left hip(s), previous total hip arthroplasty. Revision total hip arthroplasty is deemed medically necessary. The treatment options including medical management, injection therapy, arthroscopy and arthroplasty were discussed at length. The risks and benefits of total hip arthroplasty were presented and reviewed. The risks due to aseptic loosening, infection, stiffness, dislocation/subluxation,  thromboembolic complications and other imponderables were discussed.  The patient acknowledged the explanation, agreed to proceed with the plan and consent was signed. Patient is being admitted for inpatient treatment for surgery, pain  control, PT, OT, prophylactic antibiotics, VTE prophylaxis, progressive ambulation and ADL's and discharge planning. The patient is planning to be discharged home with home health services

## 2015-04-17 ENCOUNTER — Encounter (HOSPITAL_COMMUNITY)
Admission: RE | Admit: 2015-04-17 | Discharge: 2015-04-17 | Disposition: A | Discharge status: Home / Self Care | Payer: Medicare Other | Source: Ambulatory Visit | Attending: Orthopedic Surgery | Admitting: Orthopedic Surgery

## 2015-04-17 ENCOUNTER — Encounter (HOSPITAL_COMMUNITY): Payer: Self-pay

## 2015-04-17 DIAGNOSIS — Z01818 Encounter for other preprocedural examination: Secondary | ICD-10-CM | POA: Diagnosis present

## 2015-04-17 DIAGNOSIS — Z96649 Presence of unspecified artificial hip joint: Secondary | ICD-10-CM | POA: Diagnosis not present

## 2015-04-17 DIAGNOSIS — T84098A Other mechanical complication of other internal joint prosthesis, initial encounter: Secondary | ICD-10-CM | POA: Diagnosis not present

## 2015-04-17 HISTORY — DX: Personal history of antineoplastic chemotherapy: Z92.21

## 2015-04-17 HISTORY — DX: Personal history of other malignant neoplasm of skin: Z85.828

## 2015-04-17 HISTORY — DX: Personal history of transient ischemic attack (TIA), and cerebral infarction without residual deficits: Z86.73

## 2015-04-17 LAB — COMPREHENSIVE METABOLIC PANEL
ALK PHOS: 68 U/L (ref 38–126)
ALT: 15 U/L (ref 14–54)
AST: 22 U/L (ref 15–41)
Albumin: 3.8 g/dL (ref 3.5–5.0)
Anion gap: 7 (ref 5–15)
BILIRUBIN TOTAL: 0.5 mg/dL (ref 0.3–1.2)
BUN: 13 mg/dL (ref 6–20)
CHLORIDE: 102 mmol/L (ref 101–111)
CO2: 32 mmol/L (ref 22–32)
Calcium: 9.5 mg/dL (ref 8.9–10.3)
Creatinine, Ser: 0.7 mg/dL (ref 0.44–1.00)
GFR calc Af Amer: >60 mL/min (ref >60)
Glucose, Bld: 95 mg/dL (ref 65–99)
Potassium: 3.9 mmol/L (ref 3.5–5.1)
Sodium: 141 mmol/L (ref 135–145)
Total Protein: 6.6 g/dL (ref 6.5–8.1)

## 2015-04-17 LAB — URINALYSIS, ROUTINE W REFLEX MICROSCOPIC
Bilirubin Urine: NEGATIVE
GLUCOSE, UA: NEGATIVE mg/dL
HGB URINE DIPSTICK: NEGATIVE
Ketones, ur: NEGATIVE mg/dL
NITRITE: NEGATIVE
PROTEIN: NEGATIVE mg/dL
SPECIFIC GRAVITY, URINE: 1.009 (ref 1.005–1.030)
Urobilinogen, UA: 0.2 mg/dL (ref 0.0–1.0)
pH: 7 (ref 5.0–8.0)

## 2015-04-17 LAB — CBC
HEMATOCRIT: 41.4 % (ref 36.0–46.0)
Hemoglobin: 13.6 g/dL (ref 12.0–15.0)
MCH: 29.5 pg (ref 26.0–34.0)
MCHC: 32.9 g/dL (ref 30.0–36.0)
MCV: 89.8 fL (ref 78.0–100.0)
Platelets: 202 10*3/uL (ref 150–400)
RBC: 4.61 MIL/uL (ref 3.87–5.11)
RDW: 12.8 % (ref 11.5–15.5)
WBC: 5.5 10*3/uL (ref 4.0–10.5)

## 2015-04-17 LAB — APTT: APTT: 29 s (ref 24–37)

## 2015-04-17 LAB — ABO/RH: ABO/RH(D): O POS

## 2015-04-17 LAB — PROTIME-INR
INR: 0.98 (ref 0.00–1.49)
PROTHROMBIN TIME: 13.2 s (ref 11.6–15.2)

## 2015-04-17 LAB — URINE MICROSCOPIC-ADD ON

## 2015-04-17 LAB — SURGICAL PCR SCREEN
MRSA, PCR: NEGATIVE
STAPHYLOCOCCUS AUREUS: NEGATIVE

## 2015-04-17 NOTE — Progress Notes (Signed)
   04/17/15 1505  OBSTRUCTIVE SLEEP APNEA  Have you ever been diagnosed with sleep apnea through a sleep study? No  Do you snore loudly (loud enough to be heard through closed doors)?  0  Do you often feel tired, fatigued, or sleepy during the daytime? 1  Has anyone observed you stop breathing during your sleep? 1  Do you have, or are you being treated for high blood pressure? 1  BMI more than 35 kg/m2? 0  Age over 70 years old? 1  Neck circumference greater than 40 cm/16 inches? 0  Gender: 0

## 2015-04-17 NOTE — Progress Notes (Signed)
Abnormal UA faxed to Dr.Swinteck

## 2015-04-17 NOTE — Patient Instructions (Addendum)
YOUR PROCEDURE IS SCHEDULED ON : 04/22/15  REPORT TO [facility] MAIN ENTRANCE FOLLOW SIGNS TO SHORT STAY CENTER AT :  5:30 AM  CALL THIS NUMBER IF YOU HAVE PROBLEMS THE MORNING OF SURGERY [phone]  REMEMBER:  DO NOT EAT FOOD OR DRINK LIQUIDS AFTER MIDNIGHT  TAKE THESE MEDICINES THE MORNING OF SURGERY:  PROTONIX / MACROBID  YOU MAY NOT HAVE ANY METAL ON YOUR BODY INCLUDING HAIR PINS AND PIERCING'S. DO NOT WEAR JEWELRY, MAKEUP, LOTIONS, POWDERS OR PERFUMES. DO NOT WEAR NAIL POLISH. DO NOT SHAVE 48 HRS PRIOR TO SURGERY. MEN MAY SHAVE FACE AND NECK.  DO NOT [place]. [facility] IS NOT RESPONSIBLE FOR VALUABLES.  CONTACTS, DENTURES OR PARTIALS MAY NOT BE WORN TO SURGERY. LEAVE SUITCASE IN CAR. CAN BE BROUGHT TO ROOM AFTER SURGERY.  PATIENTS DISCHARGED THE DAY OF SURGERY WILL NOT BE ALLOWED TO DRIVE HOME.  PLEASE READ OVER THE FOLLOWING INSTRUCTION SHEETS _________________________________________________________________________________                                           - PREPARING FOR SURGERY  Before surgery, you can play an important role.  Because skin is not sterile, your skin needs to be as free of germs as possible.  You can reduce the number of germs on your skin by washing with CHG (chlorahexidine gluconate) soap before surgery.  CHG is an antiseptic cleaner which kills germs and bonds with the skin to continue killing germs even after washing. Please DO NOT use if you have an allergy to CHG or antibacterial soaps.  If your skin becomes reddened/irritated stop using the CHG and inform your nurse when you arrive at Short Stay. Do not shave (including legs and underarms) for at least 48 hours prior to the first CHG shower.  You may shave your face. Please follow these instructions carefully:   1.  Shower with CHG Soap the night before surgery and the  morning of Surgery.   2.  If you choose to wash your hair, wash your  hair first as usual with your  normal  Shampoo.   3.  After you shampoo, rinse your hair and body thoroughly to remove the  shampoo.                                         4.  Use CHG as you would any other liquid soap.  You can apply chg directly  to the skin and wash . Gently wash with scrungie or clean wascloth    5.  Apply the CHG Soap to your body ONLY FROM THE NECK DOWN.   Do not use on open                           Wound or open sores. Avoid contact with eyes, ears mouth and genitals (private parts).                        Genitals (private parts) with your normal soap.              6.  Wash thoroughly, paying special attention to the area where your surgery  will be performed.  7.  Thoroughly rinse your body with warm water from the neck down.   8.  DO NOT shower/wash with your normal soap after using and rinsing off  the CHG Soap .                9.  Pat yourself dry with a clean towel.             10.  Wear clean night clothes to bed after shower             11.  Place clean sheets on your bed the night of your first shower and do not  sleep with pets.  Day of Surgery : Do not apply any lotions/deodorants the morning of surgery.  Please wear clean clothes to the hospital/surgery center.  FAILURE TO FOLLOW THESE INSTRUCTIONS MAY RESULT IN THE CANCELLATION OF YOUR SURGERY    PATIENT SIGNATURE_________________________________  ______________________________________________________________________     [name]  An incentive spirometer is a tool that can help keep your lungs clear and active. This tool measures how well you are filling your lungs with each breath. Taking long deep breaths may help reverse or decrease the chance of developing breathing (pulmonary) problems (especially infection) following:  A long period of time when you are unable to move or be active. BEFORE THE PROCEDURE   If the spirometer includes an indicator to show your best  effort, your nurse or respiratory therapist will set it to a desired goal.  If possible, sit up straight or lean slightly forward. Try not to slouch.  Hold the incentive spirometer in an upright position. INSTRUCTIONS FOR USE   Sit on the edge of your bed if possible, or sit up as far as you can in bed or on a chair.  Hold the incentive spirometer in an upright position.  Breathe out normally.  Place the mouthpiece in your mouth and seal your lips tightly around it.  Breathe in slowly and as deeply as possible, raising the piston or the ball toward the top of the column.  Hold your breath for 3-5 seconds or for as long as possible. Allow the piston or ball to fall to the bottom of the column.  Remove the mouthpiece from your mouth and breathe out normally.  Rest for a few seconds and repeat Steps 1 through 7 at least 10 times every 1-2 hours when you are awake. Take your time and take a few normal breaths between deep breaths.  The spirometer may include an indicator to show your best effort. Use the indicator as a goal to work toward during each repetition.  After each set of 10 deep breaths, practice coughing to be sure your lungs are clear. If you have an incision (the cut made at the time of surgery), support your incision when coughing by placing a pillow or rolled up towels firmly against it. Once you are able to get out of bed, walk around indoors and cough well. You may stop using the incentive spirometer when instructed by your caregiver.  RISKS AND COMPLICATIONS  Take your time so you do not get dizzy or light-headed.  If you are in pain, you may need to take or ask for pain medication before doing incentive spirometry. It is harder to take a deep breath if you are having pain. AFTER USE  Rest and breathe slowly and easily.  It can be helpful to keep track of a log of your progress. Your caregiver can provide you  with a simple table to help with this. If you are using  the spirometer at home, follow these instructions: [place] IF:   You are having difficultly using the spirometer.  You have trouble using the spirometer as often as instructed.  Your pain medication is not giving enough relief while using the spirometer.  You develop fever of 100.5 F (38.1 C) or higher. SEEK IMMEDIATE MEDICAL CARE IF:   You cough up bloody sputum that had not been present before.  You develop fever of 102 F (38.9 C) or greater.  You develop worsening pain at or near the incision site. MAKE SURE YOU:   Understand these instructions.  Will watch your condition.  Will get help right away if you are not doing well or get worse. Document Released: 03/07/2007 Document Revised: 01/17/2012 Document Reviewed: 05/08/2007 ExitCare Patient Information 2014 ExitCare, [place].   ________________________________________________________________________  WHAT IS A BLOOD TRANSFUSION? Blood Transfusion Information  A transfusion is the replacement of blood or some of its parts. Blood is made up of multiple cells which provide different functions.  Red blood cells carry oxygen and are used for blood loss replacement.  White blood cells fight against infection.  Platelets control bleeding.  Plasma helps clot blood.  Other blood products are available for specialized needs, such as hemophilia or other clotting disorders. BEFORE THE TRANSFUSION  Who gives blood for transfusions?   Healthy volunteers who are fully evaluated to make sure their blood is safe. This is blood bank blood. Transfusion therapy is the safest it has ever been in the practice of medicine. Before blood is taken from a donor, a complete history is taken to make sure that person has no history of diseases nor engages in risky social behavior (examples are intravenous drug use or sexual activity with multiple partners). The donor's travel history is screened to minimize risk of transmitting  infections, such as malaria. The donated blood is tested for signs of infectious diseases, such as HIV and hepatitis. The blood is then tested to be sure it is compatible with you in order to minimize the chance of a transfusion reaction. If you or a relative donates blood, this is often done in anticipation of surgery and is not appropriate for emergency situations. It takes many days to process the donated blood. RISKS AND COMPLICATIONS Although transfusion therapy is very safe and saves many lives, the main dangers of transfusion include:   Getting an infectious disease.  Developing a transfusion reaction. This is an allergic reaction to something in the blood you were given. Every precaution is taken to prevent this. The decision to have a blood transfusion has been considered carefully by your caregiver before blood is given. Blood is not given unless the benefits outweigh the risks. AFTER THE TRANSFUSION  Right after receiving a blood transfusion, you will usually feel much better and more energetic. This is especially true if your red blood cells have gotten low (anemic). The transfusion raises the level of the red blood cells which carry oxygen, and this usually causes an energy increase.  The nurse administering the transfusion will monitor you carefully for complications. HOME CARE INSTRUCTIONS  No special instructions are needed after a transfusion. You may find your energy is better. Speak with your caregiver about any limitations on activity for underlying diseases you may have. SEEK MEDICAL CARE IF:   Your condition is not improving after your transfusion.  You develop redness or irritation at the intravenous (IV) site.  SEEK IMMEDIATE MEDICAL CARE IF:  Any of the following symptoms occur over the next 12 hours:  Shaking chills.  You have a temperature by mouth above 102 F (38.9 C), not controlled by medicine.  Chest, back, or muscle pain.  People around you feel you are  not acting correctly or are confused.  Shortness of breath or difficulty breathing.  Dizziness and fainting.  You get a rash or develop hives.  You have a decrease in urine output.  Your urine turns a dark color or changes to pink, red, or brown. Any of the following symptoms occur over the next 10 days:  You have a temperature by mouth above 102 F (38.9 C), not controlled by medicine.  Shortness of breath.  Weakness after normal activity.  The white part of the eye turns yellow (jaundice).  You have a decrease in the amount of urine or are urinating less often.  Your urine turns a dark color or changes to pink, red, or brown. Document Released: 10/22/2000 Document Revised: 01/17/2012 Document Reviewed: 06/10/2008 [facility] Patient Information 2014 [place], [place].  _______________________________________________________________________

## 2015-04-18 ENCOUNTER — Telehealth: Payer: Self-pay | Admitting: Interventional Cardiology

## 2015-04-18 NOTE — Telephone Encounter (Signed)
Margarita Grizzle fom Dr. Baron Hamper office calling re surg clearance she faxed three times-surgery Tuesday-checking on status

## 2015-04-18 NOTE — Telephone Encounter (Signed)
Returned call to [facility]. Adv her that pt is cleared fos sx. We faxed over the sign cardiac clearance request on 6/8. Adv her that I will have Dr.Smith re-sign and fax over later this afternoon. She verbalized understanding

## 2015-04-18 NOTE — Telephone Encounter (Signed)
Clearance refaxed

## 2015-04-22 ENCOUNTER — Inpatient Hospital Stay (HOSPITAL_COMMUNITY): Payer: Medicare Other

## 2015-04-22 ENCOUNTER — Inpatient Hospital Stay (HOSPITAL_COMMUNITY)
Admission: RE | Admit: 2015-04-22 | Discharge: 2015-04-24 | DRG: 468 | Disposition: A | Discharge status: Home Health | Payer: Medicare Other | Source: Ambulatory Visit | Attending: Orthopedic Surgery | Admitting: Orthopedic Surgery

## 2015-04-22 ENCOUNTER — Inpatient Hospital Stay (HOSPITAL_COMMUNITY): Payer: Medicare Other | Admitting: Anesthesiology

## 2015-04-22 ENCOUNTER — Encounter (HOSPITAL_COMMUNITY): Payer: Self-pay | Admitting: *Deleted

## 2015-04-22 ENCOUNTER — Encounter (HOSPITAL_COMMUNITY)
Admission: RE | Disposition: A | Discharge status: Home Health | Payer: Self-pay | Source: Ambulatory Visit | Attending: Orthopedic Surgery

## 2015-04-22 ENCOUNTER — Other Ambulatory Visit: Payer: Self-pay

## 2015-04-22 DIAGNOSIS — I10 Essential (primary) hypertension: Secondary | ICD-10-CM | POA: Diagnosis not present

## 2015-04-22 DIAGNOSIS — Z8673 Personal history of transient ischemic attack (TIA), and cerebral infarction without residual deficits: Secondary | ICD-10-CM

## 2015-04-22 DIAGNOSIS — Z803 Family history of malignant neoplasm of breast: Secondary | ICD-10-CM

## 2015-04-22 DIAGNOSIS — R079 Chest pain, unspecified: Secondary | ICD-10-CM | POA: Diagnosis not present

## 2015-04-22 DIAGNOSIS — T84018A Broken internal joint prosthesis, other site, initial encounter: Secondary | ICD-10-CM

## 2015-04-22 DIAGNOSIS — R000 Tachycardia, unspecified: Secondary | ICD-10-CM | POA: Diagnosis not present

## 2015-04-22 DIAGNOSIS — R0902 Hypoxemia: Secondary | ICD-10-CM | POA: Diagnosis not present

## 2015-04-22 DIAGNOSIS — I471 Supraventricular tachycardia: Secondary | ICD-10-CM | POA: Diagnosis not present

## 2015-04-22 DIAGNOSIS — Z853 Personal history of malignant neoplasm of breast: Secondary | ICD-10-CM | POA: Diagnosis not present

## 2015-04-22 DIAGNOSIS — R7981 Abnormal blood-gas level: Secondary | ICD-10-CM

## 2015-04-22 DIAGNOSIS — Z8249 Family history of ischemic heart disease and other diseases of the circulatory system: Secondary | ICD-10-CM | POA: Diagnosis not present

## 2015-04-22 DIAGNOSIS — Y792 Prosthetic and other implants, materials and accessory orthopedic devices associated with adverse incidents: Secondary | ICD-10-CM | POA: Diagnosis present

## 2015-04-22 DIAGNOSIS — E669 Obesity, unspecified: Secondary | ICD-10-CM | POA: Diagnosis present

## 2015-04-22 DIAGNOSIS — Z85828 Personal history of other malignant neoplasm of skin: Secondary | ICD-10-CM

## 2015-04-22 DIAGNOSIS — F419 Anxiety disorder, unspecified: Secondary | ICD-10-CM | POA: Diagnosis present

## 2015-04-22 DIAGNOSIS — T84061A Wear of articular bearing surface of internal prosthetic left hip joint, initial encounter: Principal | ICD-10-CM | POA: Diagnosis present

## 2015-04-22 DIAGNOSIS — Z823 Family history of stroke: Secondary | ICD-10-CM

## 2015-04-22 DIAGNOSIS — M25552 Pain in left hip: Secondary | ICD-10-CM | POA: Diagnosis present

## 2015-04-22 DIAGNOSIS — Z6833 Body mass index (BMI) 33.0-33.9, adult: Secondary | ICD-10-CM

## 2015-04-22 DIAGNOSIS — E860 Dehydration: Secondary | ICD-10-CM | POA: Diagnosis present

## 2015-04-22 DIAGNOSIS — K219 Gastro-esophageal reflux disease without esophagitis: Secondary | ICD-10-CM | POA: Diagnosis present

## 2015-04-22 DIAGNOSIS — Z01812 Encounter for preprocedural laboratory examination: Secondary | ICD-10-CM

## 2015-04-22 DIAGNOSIS — E785 Hyperlipidemia, unspecified: Secondary | ICD-10-CM | POA: Diagnosis present

## 2015-04-22 DIAGNOSIS — Z96649 Presence of unspecified artificial hip joint: Secondary | ICD-10-CM

## 2015-04-22 DIAGNOSIS — Z09 Encounter for follow-up examination after completed treatment for conditions other than malignant neoplasm: Secondary | ICD-10-CM

## 2015-04-22 DIAGNOSIS — I2510 Atherosclerotic heart disease of native coronary artery without angina pectoris: Secondary | ICD-10-CM | POA: Diagnosis not present

## 2015-04-22 HISTORY — DX: Presence of unspecified artificial hip joint: T84.018A

## 2015-04-22 HISTORY — DX: Presence of unspecified artificial hip joint: Z96.649

## 2015-04-22 HISTORY — PX: TOTAL HIP REVISION: SHX763

## 2015-04-22 HISTORY — DX: Chest pain, unspecified: R07.9

## 2015-04-22 LAB — BASIC METABOLIC PANEL
ANION GAP: 7 (ref 5–15)
BUN: 13 mg/dL (ref 6–20)
CHLORIDE: 102 mmol/L (ref 101–111)
CO2: 27 mmol/L (ref 22–32)
CREATININE: 0.56 mg/dL (ref 0.44–1.00)
Calcium: 8.4 mg/dL — ABNORMAL LOW (ref 8.9–10.3)
GFR calc non Af Amer: >60 mL/min (ref >60)
Glucose, Bld: 155 mg/dL — ABNORMAL HIGH (ref 65–99)
POTASSIUM: 3.8 mmol/L (ref 3.5–5.1)
Sodium: 136 mmol/L (ref 135–145)

## 2015-04-22 LAB — CBC
HEMATOCRIT: 37.7 % (ref 36.0–46.0)
HEMOGLOBIN: 12.4 g/dL (ref 12.0–15.0)
MCH: 29.3 pg (ref 26.0–34.0)
MCHC: 32.9 g/dL (ref 30.0–36.0)
MCV: 89.1 fL (ref 78.0–100.0)
Platelets: 186 10*3/uL (ref 150–400)
RBC: 4.23 MIL/uL (ref 3.87–5.11)
RDW: 12.8 % (ref 11.5–15.5)
WBC: 8.1 10*3/uL (ref 4.0–10.5)

## 2015-04-22 LAB — TYPE AND SCREEN
ABO/RH(D): O POS
Antibody Screen: NEGATIVE

## 2015-04-22 LAB — TROPONIN I

## 2015-04-22 LAB — CK TOTAL AND CKMB (NOT AT ARMC)
CK TOTAL: 572 U/L — AB (ref 38–234)
CK, MB: 15.5 ng/mL — ABNORMAL HIGH (ref 0.5–5.0)
Relative Index: 2.7 — ABNORMAL HIGH (ref 0.0–2.5)

## 2015-04-22 SURGERY — TOTAL HIP REVISION
Anesthesia: General | Site: Hip | Laterality: Left

## 2015-04-22 MED ORDER — METOCLOPRAMIDE HCL 5 MG/ML IJ SOLN
5.0000 mg | Freq: Three times a day (TID) | INTRAMUSCULAR | Status: DC | PRN
  Administered 2015-04-22: 10 mg via INTRAVENOUS
  Filled 2015-04-22: qty 2

## 2015-04-22 MED ORDER — LIDOCAINE HCL (CARDIAC) 20 MG/ML IV SOLN
INTRAVENOUS | Status: AC
  Filled 2015-04-22: qty 5

## 2015-04-22 MED ORDER — POVIDONE-IODINE 10 % EX SOLN
CUTANEOUS | Status: DC | PRN
  Administered 2015-04-22: 17 via TOPICAL

## 2015-04-22 MED ORDER — DEXTROSE 5 % IV SOLN
500.0000 mg | Freq: Four times a day (QID) | INTRAVENOUS | Status: DC | PRN
  Filled 2015-04-22: qty 5

## 2015-04-22 MED ORDER — CHLORHEXIDINE GLUCONATE 4 % EX LIQD: 60.0000 mL | Freq: Once | CUTANEOUS | Status: DC

## 2015-04-22 MED ORDER — METOPROLOL TARTRATE 1 MG/ML IV SOLN
INTRAVENOUS | Status: AC
  Filled 2015-04-22: qty 5

## 2015-04-22 MED ORDER — PHENYLEPHRINE HCL 10 MG/ML IJ SOLN
INTRAMUSCULAR | Status: DC | PRN
  Administered 2015-04-22: 80 ug via INTRAVENOUS
  Administered 2015-04-22: 40 ug via INTRAVENOUS
  Administered 2015-04-22: 80 ug via INTRAVENOUS

## 2015-04-22 MED ORDER — NITROFURANTOIN MONOHYD MACRO 100 MG PO CAPS
100.0000 mg | ORAL_CAPSULE | ORAL | Status: DC
  Administered 2015-04-24: 100 mg via ORAL
  Filled 2015-04-22: qty 1

## 2015-04-22 MED ORDER — BUPIVACAINE-EPINEPHRINE (PF) 0.25% -1:200000 IJ SOLN
INTRAMUSCULAR | Status: AC
  Filled 2015-04-22: qty 30

## 2015-04-22 MED ORDER — HYDROMORPHONE HCL 1 MG/ML IJ SOLN
0.2500 mg | INTRAMUSCULAR | Status: DC | PRN
  Administered 2015-04-22 (×2): 0.5 mg via INTRAVENOUS

## 2015-04-22 MED ORDER — DIPHENHYDRAMINE HCL 12.5 MG/5ML PO ELIX: 12.5000 mg | ORAL_SOLUTION | ORAL | Status: DC | PRN

## 2015-04-22 MED ORDER — FENTANYL CITRATE (PF) 100 MCG/2ML IJ SOLN
INTRAMUSCULAR | Status: DC | PRN
  Administered 2015-04-22: 100 ug via INTRAVENOUS
  Administered 2015-04-22 (×3): 50 ug via INTRAVENOUS

## 2015-04-22 MED ORDER — MIDAZOLAM HCL 5 MG/5ML IJ SOLN
INTRAMUSCULAR | Status: DC | PRN
  Administered 2015-04-22: 1 mg via INTRAVENOUS

## 2015-04-22 MED ORDER — PHENOL 1.4 % MT LIQD
1.0000 | OROMUCOSAL | Status: DC | PRN
  Filled 2015-04-22: qty 177

## 2015-04-22 MED ORDER — ONDANSETRON HCL 4 MG PO TABS: 4.0000 mg | ORAL_TABLET | Freq: Four times a day (QID) | ORAL | Status: DC | PRN

## 2015-04-22 MED ORDER — ACETAMINOPHEN 10 MG/ML IV SOLN
INTRAVENOUS | Status: AC
  Filled 2015-04-22: qty 100

## 2015-04-22 MED ORDER — DOCUSATE SODIUM 100 MG PO CAPS
100.0000 mg | ORAL_CAPSULE | Freq: Two times a day (BID) | ORAL | Status: DC
Start: 2015-04-22 — End: 2015-04-24
  Administered 2015-04-22 – 2015-04-24 (×4): 100 mg via ORAL
  Filled 2015-04-22 (×4): qty 1

## 2015-04-22 MED ORDER — ISOPROPYL ALCOHOL 70 % SOLN
Status: AC
  Filled 2015-04-22: qty 480

## 2015-04-22 MED ORDER — TRANEXAMIC ACID 1000 MG/10ML IV SOLN
2000.0000 mg | INTRAVENOUS | Status: DC
  Filled 2015-04-22: qty 20

## 2015-04-22 MED ORDER — METHOCARBAMOL 500 MG PO TABS
500.0000 mg | ORAL_TABLET | Freq: Four times a day (QID) | ORAL | Status: DC | PRN
  Administered 2015-04-22 – 2015-04-23 (×2): 500 mg via ORAL
  Filled 2015-04-22 (×2): qty 1

## 2015-04-22 MED ORDER — GLYCOPYRROLATE 0.2 MG/ML IJ SOLN
INTRAMUSCULAR | Status: DC | PRN
  Administered 2015-04-22: .6 mg via INTRAVENOUS

## 2015-04-22 MED ORDER — PHENYLEPHRINE 40 MCG/ML (10ML) SYRINGE FOR IV PUSH (FOR BLOOD PRESSURE SUPPORT)
PREFILLED_SYRINGE | INTRAVENOUS | Status: AC
  Filled 2015-04-22: qty 10

## 2015-04-22 MED ORDER — PANTOPRAZOLE SODIUM 40 MG PO TBEC
40.0000 mg | DELAYED_RELEASE_TABLET | Freq: Two times a day (BID) | ORAL | Status: DC
  Administered 2015-04-22 – 2015-04-24 (×4): 40 mg via ORAL
  Filled 2015-04-22 (×4): qty 1

## 2015-04-22 MED ORDER — CEFAZOLIN SODIUM-DEXTROSE 2-3 GM-% IV SOLR
INTRAVENOUS | Status: AC
  Filled 2015-04-22: qty 50

## 2015-04-22 MED ORDER — SODIUM CHLORIDE 0.9 % IJ SOLN
INTRAMUSCULAR | Status: AC
  Filled 2015-04-22: qty 50

## 2015-04-22 MED ORDER — LIDOCAINE HCL (CARDIAC) 20 MG/ML IV SOLN
INTRAVENOUS | Status: DC | PRN
  Administered 2015-04-22: 50 mg via INTRAVENOUS

## 2015-04-22 MED ORDER — SODIUM CHLORIDE 0.9 % IV SOLN
INTRAVENOUS | Status: DC
Start: 2015-04-22 — End: 2015-04-24
  Administered 2015-04-22: 16:00:00 via INTRAVENOUS

## 2015-04-22 MED ORDER — SODIUM CHLORIDE 0.9 % IJ SOLN
INTRAMUSCULAR | Status: DC | PRN
  Administered 2015-04-22: 30 mL

## 2015-04-22 MED ORDER — NEOSTIGMINE METHYLSULFATE 10 MG/10ML IV SOLN
INTRAVENOUS | Status: DC | PRN
  Administered 2015-04-22: 4 mg via INTRAVENOUS

## 2015-04-22 MED ORDER — LACTATED RINGERS IV SOLN
INTRAVENOUS | Status: DC | PRN
  Administered 2015-04-22 (×2): via INTRAVENOUS

## 2015-04-22 MED ORDER — METOPROLOL TARTRATE 25 MG PO TABS
25.0000 mg | ORAL_TABLET | Freq: Two times a day (BID) | ORAL | Status: DC
  Administered 2015-04-22 – 2015-04-24 (×3): 25 mg via ORAL
  Filled 2015-04-22 (×4): qty 1

## 2015-04-22 MED ORDER — HYDROGEN PEROXIDE 3 % EX SOLN
CUTANEOUS | Status: AC
  Filled 2015-04-22: qty 473

## 2015-04-22 MED ORDER — CEFAZOLIN SODIUM-DEXTROSE 2-3 GM-% IV SOLR
2.0000 g | Freq: Four times a day (QID) | INTRAVENOUS | Status: AC
  Administered 2015-04-22 (×2): 2 g via INTRAVENOUS
  Filled 2015-04-22 (×3): qty 50

## 2015-04-22 MED ORDER — TRANEXAMIC ACID 1000 MG/10ML IV SOLN
2000.0000 mg | INTRAVENOUS | Status: DC | PRN
  Administered 2015-04-22: 2000 mg via TOPICAL

## 2015-04-22 MED ORDER — LORATADINE 10 MG PO TABS
10.0000 mg | ORAL_TABLET | Freq: Every day | ORAL | Status: DC | PRN
  Filled 2015-04-22: qty 1

## 2015-04-22 MED ORDER — NEOSTIGMINE METHYLSULFATE 10 MG/10ML IV SOLN
INTRAVENOUS | Status: AC
  Filled 2015-04-22: qty 1

## 2015-04-22 MED ORDER — ONDANSETRON HCL 4 MG/2ML IJ SOLN
INTRAMUSCULAR | Status: DC | PRN
  Administered 2015-04-22: 4 mg via INTRAVENOUS

## 2015-04-22 MED ORDER — METOCLOPRAMIDE HCL 10 MG PO TABS: 5.0000 mg | ORAL_TABLET | Freq: Three times a day (TID) | ORAL | Status: DC | PRN

## 2015-04-22 MED ORDER — LACTATED RINGERS IV SOLN
INTRAVENOUS | Status: DC
  Administered 2015-04-22: 13:00:00 via INTRAVENOUS

## 2015-04-22 MED ORDER — SENNA 8.6 MG PO TABS
2.0000 | ORAL_TABLET | Freq: Every day | ORAL | Status: DC
Start: 2015-04-22 — End: 2015-04-24
  Administered 2015-04-22 – 2015-04-23 (×2): 17.2 mg via ORAL
  Filled 2015-04-22 (×2): qty 2

## 2015-04-22 MED ORDER — HYDROGEN PEROXIDE 3 % EX SOLN
CUTANEOUS | Status: DC | PRN
  Administered 2015-04-22: 17

## 2015-04-22 MED ORDER — KETOROLAC TROMETHAMINE 30 MG/ML IJ SOLN
INTRAMUSCULAR | Status: DC | PRN
  Administered 2015-04-22: 30 mg

## 2015-04-22 MED ORDER — HYDROCHLOROTHIAZIDE 10 MG/ML ORAL SUSPENSION
6.2500 mg | Freq: Every day | ORAL | Status: DC
  Administered 2015-04-23 – 2015-04-24 (×2): 6.25 mg via ORAL
  Filled 2015-04-22 (×3): qty 1.25

## 2015-04-22 MED ORDER — HYDROMORPHONE HCL 1 MG/ML IJ SOLN
0.5000 mg | INTRAMUSCULAR | Status: DC | PRN
  Administered 2015-04-22: 0.25 mg via INTRAVENOUS
  Administered 2015-04-24: 0.5 mg via INTRAVENOUS
  Filled 2015-04-22 (×2): qty 1

## 2015-04-22 MED ORDER — NITROGLYCERIN 0.4 MG SL SUBL: 0.4000 mg | SUBLINGUAL_TABLET | SUBLINGUAL | Status: DC | PRN

## 2015-04-22 MED ORDER — LOSARTAN POTASSIUM-HCTZ 50-12.5 MG PO TABS: 0.5000 | ORAL_TABLET | Freq: Every day | ORAL | Status: DC

## 2015-04-22 MED ORDER — ACETAMINOPHEN 325 MG PO TABS: 650.0000 mg | ORAL_TABLET | Freq: Four times a day (QID) | ORAL | Status: DC | PRN

## 2015-04-22 MED ORDER — ROCURONIUM BROMIDE 100 MG/10ML IV SOLN
INTRAVENOUS | Status: AC
  Filled 2015-04-22: qty 1

## 2015-04-22 MED ORDER — HYDROMORPHONE HCL 1 MG/ML IJ SOLN
INTRAMUSCULAR | Status: AC
  Filled 2015-04-22: qty 1

## 2015-04-22 MED ORDER — DEXAMETHASONE SODIUM PHOSPHATE 10 MG/ML IJ SOLN
10.0000 mg | Freq: Once | INTRAMUSCULAR | Status: AC
  Administered 2015-04-23: 10 mg via INTRAVENOUS
  Filled 2015-04-22: qty 1

## 2015-04-22 MED ORDER — HYDROCODONE-ACETAMINOPHEN 5-325 MG PO TABS
1.0000 | ORAL_TABLET | ORAL | Status: DC | PRN
  Administered 2015-04-22 – 2015-04-24 (×6): 2 via ORAL
  Administered 2015-04-24: 1 via ORAL
  Filled 2015-04-22 (×5): qty 2
  Filled 2015-04-22: qty 1
  Filled 2015-04-22: qty 2

## 2015-04-22 MED ORDER — MENTHOL 3 MG MT LOZG: 1.0000 | LOZENGE | OROMUCOSAL | Status: DC | PRN

## 2015-04-22 MED ORDER — KETOROLAC TROMETHAMINE 15 MG/ML IJ SOLN
7.5000 mg | Freq: Four times a day (QID) | INTRAMUSCULAR | Status: AC
  Administered 2015-04-22 – 2015-04-23 (×3): 7.5 mg via INTRAVENOUS
  Filled 2015-04-22 (×3): qty 1

## 2015-04-22 MED ORDER — ONDANSETRON HCL 4 MG/2ML IJ SOLN
4.0000 mg | Freq: Four times a day (QID) | INTRAMUSCULAR | Status: DC | PRN
  Administered 2015-04-22: 4 mg via INTRAVENOUS
  Filled 2015-04-22: qty 2

## 2015-04-22 MED ORDER — CEFAZOLIN SODIUM-DEXTROSE 2-3 GM-% IV SOLR
2.0000 g | INTRAVENOUS | Status: AC
  Administered 2015-04-22: 2 g via INTRAVENOUS

## 2015-04-22 MED ORDER — LOSARTAN POTASSIUM 50 MG PO TABS
25.0000 mg | ORAL_TABLET | Freq: Every day | ORAL | Status: DC
  Administered 2015-04-23 – 2015-04-24 (×2): 25 mg via ORAL
  Filled 2015-04-22 (×3): qty 1

## 2015-04-22 MED ORDER — LACTATED RINGERS IV BOLUS (SEPSIS)
500.0000 mL | Freq: Once | INTRAVENOUS | Status: AC
  Administered 2015-04-22: 500 mL via INTRAVENOUS

## 2015-04-22 MED ORDER — PROPOFOL 10 MG/ML IV BOLUS
INTRAVENOUS | Status: DC | PRN
  Administered 2015-04-22: 180 mg via INTRAVENOUS

## 2015-04-22 MED ORDER — SODIUM CHLORIDE 0.9 % IV SOLN: INTRAVENOUS | Status: DC

## 2015-04-22 MED ORDER — SUCCINYLCHOLINE CHLORIDE 20 MG/ML IJ SOLN
INTRAMUSCULAR | Status: DC | PRN
  Administered 2015-04-22: 100 mg via INTRAVENOUS

## 2015-04-22 MED ORDER — EPHEDRINE SULFATE 50 MG/ML IJ SOLN
INTRAMUSCULAR | Status: AC
  Filled 2015-04-22: qty 1

## 2015-04-22 MED ORDER — METOPROLOL TARTRATE 1 MG/ML IV SOLN
2.5000 mg | INTRAVENOUS | Status: AC
  Administered 2015-04-22 (×4): 2.5 mg via INTRAVENOUS

## 2015-04-22 MED ORDER — APIXABAN 2.5 MG PO TABS
2.5000 mg | ORAL_TABLET | Freq: Two times a day (BID) | ORAL | Status: DC
Start: 2015-04-23 — End: 2015-04-24
  Administered 2015-04-23 – 2015-04-24 (×3): 2.5 mg via ORAL
  Filled 2015-04-22 (×4): qty 1

## 2015-04-22 MED ORDER — EPHEDRINE SULFATE 50 MG/ML IJ SOLN
INTRAMUSCULAR | Status: DC | PRN
  Administered 2015-04-22: 10 mg via INTRAVENOUS
  Administered 2015-04-22: 5 mg via INTRAVENOUS
  Administered 2015-04-22 (×2): 10 mg via INTRAVENOUS
  Administered 2015-04-22: 5 mg via INTRAVENOUS
  Administered 2015-04-22: 10 mg via INTRAVENOUS

## 2015-04-22 MED ORDER — DEXAMETHASONE SODIUM PHOSPHATE 10 MG/ML IJ SOLN
INTRAMUSCULAR | Status: DC | PRN
  Administered 2015-04-22: 8 mg via INTRAVENOUS

## 2015-04-22 MED ORDER — ROCURONIUM BROMIDE 100 MG/10ML IV SOLN
INTRAVENOUS | Status: DC | PRN
  Administered 2015-04-22: 50 mg via INTRAVENOUS

## 2015-04-22 MED ORDER — SIMVASTATIN 40 MG PO TABS
40.0000 mg | ORAL_TABLET | Freq: Every evening | ORAL | Status: DC
  Administered 2015-04-23: 40 mg via ORAL
  Filled 2015-04-22 (×2): qty 1

## 2015-04-22 MED ORDER — ACETAMINOPHEN 650 MG RE SUPP: 650.0000 mg | Freq: Four times a day (QID) | RECTAL | Status: DC | PRN

## 2015-04-22 MED ORDER — FENTANYL CITRATE (PF) 250 MCG/5ML IJ SOLN
INTRAMUSCULAR | Status: AC
  Filled 2015-04-22: qty 5

## 2015-04-22 MED ORDER — KETOROLAC TROMETHAMINE 30 MG/ML IJ SOLN
INTRAMUSCULAR | Status: AC
  Filled 2015-04-22: qty 1

## 2015-04-22 MED ORDER — MIDAZOLAM HCL 2 MG/2ML IJ SOLN
INTRAMUSCULAR | Status: AC
  Filled 2015-04-22: qty 2

## 2015-04-22 MED ORDER — PROPOFOL 10 MG/ML IV BOLUS
INTRAVENOUS | Status: AC
  Filled 2015-04-22: qty 20

## 2015-04-22 MED ORDER — SODIUM CHLORIDE 0.9 % IJ SOLN
INTRAMUSCULAR | Status: AC
  Filled 2015-04-22: qty 10

## 2015-04-22 MED ORDER — ACETAMINOPHEN 10 MG/ML IV SOLN
1000.0000 mg | Freq: Once | INTRAVENOUS | Status: AC
  Administered 2015-04-22: 1000 mg via INTRAVENOUS
  Filled 2015-04-22: qty 100

## 2015-04-22 MED ORDER — ONDANSETRON HCL 4 MG/2ML IJ SOLN
INTRAMUSCULAR | Status: AC
  Filled 2015-04-22: qty 2

## 2015-04-22 MED ORDER — GLYCOPYRROLATE 0.2 MG/ML IJ SOLN
INTRAMUSCULAR | Status: AC
  Filled 2015-04-22: qty 3

## 2015-04-22 MED ORDER — METOPROLOL TARTRATE 1 MG/ML IV SOLN
INTRAVENOUS | Status: AC
Start: 2015-04-22 — End: 2015-04-23
  Filled 2015-04-22: qty 5

## 2015-04-22 MED ORDER — BUPIVACAINE-EPINEPHRINE 0.25% -1:200000 IJ SOLN
INTRAMUSCULAR | Status: DC | PRN
  Administered 2015-04-22: 30 mL

## 2015-04-22 SURGICAL SUPPLY — 71 items
BAG DECANTER FOR FLEXI CONT (MISCELLANEOUS) ×3 IMPLANT
BAG SPEC THK2 15X12 ZIP CLS (MISCELLANEOUS) ×1
BAG ZIPLOCK 12X15 (MISCELLANEOUS) ×3 IMPLANT
BLADE SAW SGTL 18X1.27X75 (BLADE) ×2 IMPLANT
BLADE SAW SGTL 18X1.27X75MM (BLADE) ×1
BRUSH FEMORAL CANAL (MISCELLANEOUS) IMPLANT
COVER MAYO STAND STRL (DRAPES) ×2 IMPLANT
DRAPE HIP W/POCKET STRL (DRAPE) ×2 IMPLANT
DRAPE INCISE IOBAN 85X60 (DRAPES) ×3 IMPLANT
DRAPE ORTHO SPLIT 77X108 STRL (DRAPES) ×6
DRAPE POUCH INSTRU U-SHP 10X18 (DRAPES) ×3 IMPLANT
DRAPE SHEET LG 3/4 BI-LAMINATE (DRAPES) ×4 IMPLANT
DRAPE SURG 17X11 SM STRL (DRAPES) ×3 IMPLANT
DRAPE SURG ORHT 6 SPLT 77X108 (DRAPES) ×2 IMPLANT
DRAPE U-SHAPE 47X51 STRL (DRAPES) ×3 IMPLANT
DRSG AQUACEL AG ADV 3.5X10 (GAUZE/BANDAGES/DRESSINGS) ×3 IMPLANT
DRSG AQUACEL AG ADV 3.5X14 (GAUZE/BANDAGES/DRESSINGS) ×3 IMPLANT
DURAPREP 26ML APPLICATOR (WOUND CARE) ×3 IMPLANT
ELECT BLADE TIP CTD 4 INCH (ELECTRODE) ×3 IMPLANT
ELECT REM PT RETURN 9FT ADLT (ELECTROSURGICAL) ×3
ELECTRODE REM PT RTRN 9FT ADLT (ELECTROSURGICAL) ×1 IMPLANT
FACESHIELD WRAPAROUND (MASK) ×12 IMPLANT
FACESHIELD WRAPAROUND OR TEAM (MASK) ×4 IMPLANT
GAUZE SPONGE 2X2 8PLY STRL LF (GAUZE/BANDAGES/DRESSINGS) ×1 IMPLANT
GLOVE BIOGEL PI IND STRL 7.5 (GLOVE) ×1 IMPLANT
GLOVE BIOGEL PI IND STRL 8.5 (GLOVE) ×1 IMPLANT
GLOVE BIOGEL PI INDICATOR 7.5 (GLOVE) ×2
GLOVE BIOGEL PI INDICATOR 8.5 (GLOVE) ×2
GLOVE ECLIPSE 8.0 STRL XLNG CF (GLOVE) ×6 IMPLANT
GOWN SPEC L3 XXLG W/TWL (GOWN DISPOSABLE) ×6 IMPLANT
GOWN STRL REUS W/TWL LRG LVL3 (GOWN DISPOSABLE) ×3 IMPLANT
HANDPIECE INTERPULSE COAX TIP (DISPOSABLE)
HEAD FEM OSTEO MORSE TAPERED (Head) IMPLANT
HEAD OSTEO MORSE TAPE (Head) ×3 IMPLANT
INSERT ACTB 10D 54X32XSTRL LF (Orthopedic Implant) IMPLANT
INSERT OSTEO CFIRE ACET (Orthopedic Implant) ×3 IMPLANT
INSRT ACTB 10D 54X32XSTRL LF (Orthopedic Implant) ×1 IMPLANT
KIT BASIN OR (CUSTOM PROCEDURE TRAY) ×3 IMPLANT
LIQUID BAND (GAUZE/BANDAGES/DRESSINGS) ×3 IMPLANT
MANIFOLD NEPTUNE II (INSTRUMENTS) ×3 IMPLANT
NDL SAFETY ECLIPSE 18X1.5 (NEEDLE) ×1 IMPLANT
NDL SPNL 18GX3.5 QUINCKE PK (NEEDLE) IMPLANT
NEEDLE HYPO 18GX1.5 SHARP (NEEDLE) ×9
NEEDLE SPNL 18GX3.5 QUINCKE PK (NEEDLE) ×3 IMPLANT
NS IRRIG 1000ML POUR BTL (IV SOLUTION) ×6 IMPLANT
PACK TOTAL JOINT (CUSTOM PROCEDURE TRAY) ×3 IMPLANT
PADDING CAST COTTON 6X4 STRL (CAST SUPPLIES) ×3 IMPLANT
PEN SKIN MARKING BROAD (MISCELLANEOUS) ×2 IMPLANT
POSITIONER SURGICAL ARM (MISCELLANEOUS) ×3 IMPLANT
PRESSURIZER FEMORAL UNIV (MISCELLANEOUS) IMPLANT
SET HNDPC FAN SPRY TIP SCT (DISPOSABLE) IMPLANT
SPONGE GAUZE 2X2 STER 10/PKG (GAUZE/BANDAGES/DRESSINGS) ×2
SPONGE LAP 18X18 X RAY DECT (DISPOSABLE) ×3 IMPLANT
SPONGE LAP 4X18 X RAY DECT (DISPOSABLE) ×3 IMPLANT
STAPLER VISISTAT 35W (STAPLE) ×3 IMPLANT
SUCTION FRAZIER TIP 10 FR DISP (SUCTIONS) ×3 IMPLANT
SUT ETHIBOND NAB CT1 #1 30IN (SUTURE) ×4 IMPLANT
SUT MNCRL AB 3-0 PS2 18 (SUTURE) ×2 IMPLANT
SUT MON AB 2-0 CT1 36 (SUTURE) ×6 IMPLANT
SUT VIC AB 1 CT1 36 (SUTURE) ×11 IMPLANT
SUT VIC AB 2-0 CT1 27 (SUTURE) ×9
SUT VIC AB 2-0 CT1 TAPERPNT 27 (SUTURE) ×3 IMPLANT
SUT VLOC 180 0 24IN GS25 (SUTURE) ×6 IMPLANT
SWAB COLLECTION DEVICE MRSA (MISCELLANEOUS) ×2 IMPLANT
SYR 50ML LL SCALE MARK (SYRINGE) ×5 IMPLANT
TOWEL OR 17X26 10 PK STRL BLUE (TOWEL DISPOSABLE) ×6 IMPLANT
TOWER CARTRIDGE SMART MIX (DISPOSABLE) IMPLANT
TRAY FOLEY W/METER SILVER 14FR (SET/KITS/TRAYS/PACK) ×3 IMPLANT
TUBE ANAEROBIC SPECIMEN COL (MISCELLANEOUS) ×2 IMPLANT
TUBE KAMVAC SUCTION (TUBING) ×2 IMPLANT
WATER STERILE IRR 1500ML POUR (IV SOLUTION) ×3 IMPLANT

## 2015-04-22 NOTE — Progress Notes (Signed)
Patient states she can feel "heart racing."

## 2015-04-22 NOTE — Progress Notes (Signed)
1620 patient complaints of chest discomfort, nauseated, and vomiting.  ekg and vital signs obtained Dr Lyla Glassing paged.

## 2015-04-22 NOTE — Progress Notes (Signed)
Dr. Landry Dyke  Made aware

## 2015-04-22 NOTE — Progress Notes (Signed)
Dr. Sid Falcon office notified of patient's EKG  Readings- and Dr. Clerance Lav order to put patient on Telemetry

## 2015-04-22 NOTE — Progress Notes (Signed)
Metoprolol 2.5 mg IVP given

## 2015-04-22 NOTE — Progress Notes (Signed)
Called by floor nurse regarding new onset chest pain. Vitals stable. No MS changes. EKG showed changes when compared to PACU EKG. HR 100s. Ordered SL NTG, O2, serial cardiac enzymes. Will transfer to telemetry. Spoke with cardiology who will evaluate the patient.

## 2015-04-22 NOTE — Progress Notes (Signed)
Dr. Landry Dyke in to see patient- and EKG copy- orders given.

## 2015-04-22 NOTE — Consult Note (Signed)
CARDIOLOGY CONSULT NOTE   Patient ID: [name] MRN: [id number] DOB/AGE: November 02, 1946 69 y.o.  Admit date: 04/22/2015  Primary Physician   Shirline Frees, MD Primary Cardiologist   Dr [name] Reason for Consultation   Chest pain  [name]:CXKGY C Worm is a 69 y.o. year old female with a history of syncope and DOE (reasons she saw Dr [name]), HTN, HL, and obesity. In 2015, she had a MV that was Low risk, EF 58% and no further cardiac workup was indicated.  She had a failed left total hip replacement and came to the hospital today for revision. The revision was not successful. After the procedure, she was noted to be tachycardic with a heart rate up to 108. Blood pressure was stable and she was given 2 boluses of LR 500 mL's. She was given IV Lopressor totaling 10 mg. She also had problems with some postoperative nausea and vomiting. After that, she developed chest pain. Her ECG showed some minor differences and cardiology was asked to evaluate her. She refused nitroglycerin. She was given Zofran for the nausea and vomiting as well as Reglan. She was given Dilaudid with improvement in her pain.     Past Medical History  Diagnosis Date  . Breast cancer   . Hypertension   . GERD (gastroesophageal reflux disease)   . Headache(784.0)     migraines  . Arthritis   . PONV (postoperative nausea and vomiting)   . Hyperlipidemia   . Breast cancer   . DJD (degenerative joint disease)   . Squamous cell carcinoma   . Hx-TIA (transient ischemic attack)     X2  . History of skin cancer   . History of cancer chemotherapy 1996    ALSO RADIATION - BREAST CANCER     Past Surgical History  Procedure Laterality Date  . Total hip arthroplasty Bilateral 1987, 1989, 1997    right  x 2  . Back fusion  2005, 2008, 2011    T4-S1  . Rotator cuff repair Right     x 2  . Breast lumpectomy Left   . Bunionectomy Bilateral   . Hysteroscopy w/d&c N/A 08/15/2013    Procedure: DILATATION AND  CURETTAGE /HYSTEROSCOPY WITH RESECTION;  Surgeon: Princess Bruins, MD;  Location: [facility];  Service: Gynecology;  Laterality: N/A;  . Shoulder arthroscopy Left   . Tonsillectomy      x 4, kept growing back  . Thumb arthroscopy Right     left, cyst removal  . Cholecystectomy    . Nm myoview ltd  12/13/2013    Low risk study, no areas of significant ischemia. Mild apical, distal anterolateral wall perfusion defect is mostly fixed and likely represents attenuation artifact. EF 58%.    Allergies  Allergen Reactions  . Sulfa Antibiotics Hives and Rash  . Tincture Of Benzoin [Benzoin] Itching    Burned skin  . Demerol [Meperidine] Itching and Nausea Only    Depressed breathing and slurred speach  . Hydrocodone Itching and Nausea And Vomiting  . Oxycodone Itching and Nausea And Vomiting    I have reviewed the patient's current medications . [START ON 04/23/2015] apixaban  2.5 mg Oral Q12H  .  ceFAZolin (ANCEF) IV  2 g Intravenous Q6H  . [START ON 04/23/2015] dexamethasone  10 mg Intravenous Once  . docusate sodium  100 mg Oral BID  . losartan  25 mg Oral Daily   And  . hydrochlorothiazide  6.25 mg Oral Daily  .  HYDROmorphone      . ketorolac  7.5 mg Intravenous Q6H  . metoprolol      . metoprolol      . [START ON 04/24/2015] nitrofurantoin (macrocrystal-monohydrate)  100 mg Oral QODAY  . pantoprazole  40 mg Oral BID  . senna  2 tablet Oral QHS  . simvastatin  40 mg Oral QPM   . sodium chloride 150 mL/hr at 04/22/15 1546   acetaminophen **OR** acetaminophen, diphenhydrAMINE, HYDROcodone-acetaminophen, HYDROmorphone (DILAUDID) injection, loratadine, menthol-cetylpyridinium **OR** phenol, methocarbamol **OR** methocarbamol (ROBAXIN)  IV, metoCLOPramide **OR** metoCLOPramide (REGLAN) injection, nitroGLYCERIN, ondansetron **OR** ondansetron (ZOFRAN) IV  Medication Sig  acetaminophen-codeine (TYLENOL #3) 300-30 MG per tablet Take 1 tablet by mouth 2 (two) times a week. As needed for  pain.  Ascorbic Acid (VITAMIN C) 100 MG tablet Take 500 mg by mouth every morning.   Biotin 5000 MCG CAPS Take 1 capsule by mouth at bedtime.   Calcium-Vitamin D-Vitamin K (VIACTIV PO) Take 1 tablet by mouth every morning.   cycloSPORINE (RESTASIS) 0.05 % ophthalmic emulsion Place 1 drop into both eyes 2 (two) times daily.  fluorometholone (FML) 0.1 % ophthalmic suspension Place 1 drop into both eyes 2 (two) times daily.  Lactobacillus (ACIDOPHILUS PO) Take 1 tablet by mouth every morning.   losartan-hydrochlorothiazide (HYZAAR) 50-12.5 MG per tablet Take 0.5 tablets by mouth at bedtime.   Multiple Vitamin (MULTIVITAMIN WITH MINERALS) TABS tablet Take 1 tablet by mouth every morning. Centrum Silver.  nitrofurantoin, macrocrystal-monohydrate, (MACROBID) 100 MG capsule Take 100 mg by mouth every other day. Am.  pantoprazole (PROTONIX) 40 MG tablet Take 1 tablet (40 mg total) by mouth 2 (two) times daily. Patient taking differently: Take 40 mg by mouth every morning.   Polyethyl Glycol-Propyl Glycol (SYSTANE OP) Apply 1 drop to eye 2 (two) times daily.  simvastatin (ZOCOR) 40 MG tablet Take 1 tablet (40 mg total) by mouth every evening.  loratadine (CLARITIN) 10 MG tablet Take 10 mg by mouth daily as needed for allergies.   sucralfate (CARAFATE) 1 GM/10ML suspension Take 10 mLs (1 g total) by mouth 2 (two) times daily. Patient not taking: Reported on 04/14/2015  terbinafine (LAMISIL) 250 MG tablet Take 1 tablet (250 mg total) by mouth daily. Patient not taking: Reported on 04/14/2015  terbinafine (LAMISIL) 250 MG tablet Take one tablet by mouth every other day. Patient not taking: Reported on 04/14/2015  traMADol (ULTRAM) 50 MG tablet Take 1 tablet (50 mg total) by mouth every 6 (six) hours as needed. Patient not taking: Reported on 04/14/2015     History   Social History  . Marital Status: Married    Spouse Name: N/A  . Number of Children: N/A  . Years of Education: N/A   Occupational History    . Retired    Social History Main Topics  . Smoking status: Never Smoker   . Smokeless tobacco: Never Used  . Alcohol Use: Yes     Comment: 1 glass weekly  . Drug Use: No  . Sexual Activity: Yes    Birth Control/ Protection: Post-menopausal   Other Topics Concern  . Not on file   Social History Narrative   Lives in [place] with her husband.    Family Status  Relation Status Death Age  . Mother Deceased   . Father Deceased    Family History  Problem Relation Age of Onset  . Breast cancer Mother   . Hypertension Father   . Heart attack Father   .  Hyperlipidemia Father   . Colon cancer Father   . Hypertension Sister   . Breast cancer Maternal Grandmother   . Stroke Maternal Grandfather   . Heart attack Paternal Grandmother   . Cervical cancer Paternal Grandmother   . Heart attack Paternal Grandfather      ROS:  Full 14 point review of systems complete and found to be negative unless listed above.  Physical Exam: Blood pressure 119/64, pulse 109, temperature 98.6 F (37 C), resp. rate 18, height 5' (1.524 m), weight 174 lb (78.926 kg), SpO2 94 %.  General: Well developed, well nourished, female in no acute distress Head: Eyes PERRLA, No xanthomas.   Normocephalic and atraumatic, oropharynx without edema or exudate. Dentition:  Lungs:  Heart: Mildly tachy, HRRR S1 S2, no rub/gallop, no murmur. pulses are 2+ extrem.   Neck: No carotid bruits. No lymphadenopathy.  No JVD. Abdomen: Bowel sounds present, abdomen soft and non-tender without masses or hernias noted. Extremities: No clubbing or cyanosis.  edema.  Neuro: Alert and oriented X 3. No focal deficits noted. Psych:  Good affect, responds appropriately Skin: No rashes or lesions noted.  Labs:   Lab Results  Component Value Date   WBC 5.5 04/17/2015   HGB 13.6 04/17/2015   HCT 41.4 04/17/2015   MCV 89.8 04/17/2015   PLT 202 04/17/2015    Recent Labs Lab 04/17/15 1540  NA 141  K 3.9   CL 102  CO2 32  BUN 13  CREATININE 0.70  CALCIUM 9.5  PROT 6.6  BILITOT 0.5  ALKPHOS 68  ALT 15  AST 22  GLUCOSE 95  ALBUMIN 3.8   ECG: Sinus tachycardia, Q waves noted in lead 3 which are old.  Small Q waves noted in aVF and lead 2, significance unclear Heart rate 109  Radiology:  Dg Pelvis Portable 04/22/2015   CLINICAL DATA:  Revision of left hip replacement  EXAM: PORTABLE PELVIS 1-2 VIEWS  COMPARISON:  CT of the thoracolumbar spine of 04/17/2013  FINDINGS: A new left total hip replacement is present without complicating features. Some air is noted in the overlying soft tissues the left hip postoperatively. A right total hip replacement again is noted as is hardware for fixation of the lower lumbar spine.  IMPRESSION: New left hip replacement without complicating feature.   Electronically Signed   By: Ivar Drape M.D.   On: 04/22/2015 11:08    ASSESSMENT AND PLAN:   The patient was seen today by Dr Aundra Dubin, the patient evaluated and the data reviewed.  Principal Problem:   Failed total hip arthroplasty Active Problems:   Chest pain, moderate coronary artery risk   Signed: Lenoard Aden 04/22/2015 5:41 PM Beeper [phone]  Co-Sign MD  Patient seen with PA, agree with the above note.    Patient has no prior cardiac history.  She had a presumed vagal episode with syncope in the past.  She had a Cardiolite in 2/15 with no evidence for ischemia.  She had a left hip revision surgery today.  Post-op, she felt her heart racing.  She has been in sinus tachycardia (mild, HR 100s).  She says that she was feeling her heart racing even prior to surgery but pre-op ECG was not tachycardic.  She vomited x 3, and after vomiting developed central chest pain that only lasted 1 minute.  She is feeling considerably better now after getting Dilaudid, Zofran, and Reglan.  HR is still elevated around 100 bpm.  1. Chest pain: I  suspect that this was due to vomiting (began after emesis).  ECG  showed no acute changes compared to prior.   - Will cycle troponin.  - metoprolol 25 mg bid.  2. Tachycardia: Mild sinus tachycardia: Possibly due to anxiety, mild dehydration, and pain.  She is now getting IV fluid.   - Transfer to telemetry, watch for atrial fibrillation development.  - Will get echocardiogram.   Loralie Champagne 04/22/2015 6:07 PM

## 2015-04-22 NOTE — Progress Notes (Signed)
Dr. Landry Dyke in to see patient- orders given.

## 2015-04-22 NOTE — Progress Notes (Signed)
Dr. Landry Dyke made aware of patient's heart rates being 102-103-O.K. To go to 6 floor.-per Dr. Landry Dyke.

## 2015-04-22 NOTE — Progress Notes (Signed)
Orthopedic Tech Progress Note Patient Details:  BREANE GRUNWALD 15-Aug-1946 [id number]  Patient ID: Shea Stakes, female   DOB: September 22, 1946, 69 y.o.   MRN: [id number]   Irish Elders 04/22/2015, 3:01 PMTrapeze bar

## 2015-04-22 NOTE — Brief Op Note (Signed)
04/22/2015  10:33 AM  PATIENT:  [name]  69 y.o. female  PRE-OPERATIVE DIAGNOSIS:  failed left total hip  POST-OPERATIVE DIAGNOSIS:  failed left total hip  PROCEDURE:  Procedure(s): LEFT HIP REVISION ACETABULAR COMPONENT (Left)  SURGEON:  Surgeon(s) and Role:    * Rod Can, MD - Primary  PHYSICIAN ASSISTANT:   ASSISTANTS: Roberto Scales, RNFA   ANESTHESIA:   general  EBL:  Total I/O In: 1000 [I.V.:1000] Out: 650 [Urine:500; Blood:150]  BLOOD ADMINISTERED:none  DRAINS: none   LOCAL MEDICATIONS USED:  MARCAINE     SPECIMEN:  Source of Specimen:  L hip synovial fluid for culture  DISPOSITION OF SPECIMEN:  micro  COUNTS:  YES  TOURNIQUET:  * No tourniquets in log *  DICTATION: .Other Dictation: Dictation Number [id number]  PLAN OF CARE: Admit to inpatient   PATIENT DISPOSITION:  PACU - hemodynamically stable.   Delay start of Pharmacological VTE agent (>24hrs) due to surgical blood loss or risk of bleeding: not applicable

## 2015-04-22 NOTE — Progress Notes (Signed)
Portable AP Pelvis X-ray done. 

## 2015-04-22 NOTE — H&P (View-Only) (Signed)
TOTAL HIP REVISION ADMISSION H&P  Patient is admitted for left revision total hip arthroplasty.  Subjective:  Chief Complaint: left hip pain  HPI: [name], 69 y.o. 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 The indications for the revision total hip arthroplasty are bearing surface wear leading to  symptomatic synovitis and hip instability.  Onset of symptoms was gradual starting 1 years ago with gradually worsening course since that time.  Prior procedures on the left hip include arthroplasty.  Patient currently rates pain in the left hip at 10 out of 10 with activity.  There is pain that interfers with activities of daily living and instability. Patient has evidence of osteolysis and eccentric poly wear by imaging studies.  This condition presents safety issues increasing the risk of falls.  There is no current active infection.  Patient Active Problem List   Diagnosis Date Noted  . ADENOCARCINOMA, BREAST, LEFT 04/23/2008  . HYPERCHOLESTEROLEMIA 04/23/2008  . HYPERTENSION 04/23/2008  . TRANSIENT ISCHEMIC ATTACK 04/23/2008  . ARTHRITIS 04/23/2008  . 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 DEEP VENOUS THROMBOPHLEBITIS, HX OF 04/23/2008  . HEADACHE, CHRONIC, HX OF 04/23/2008  . DIVERTICULOSIS, COLON 12/15/2006  . BENIGN NEOPLASM OF ADRENAL GLAND 10/25/2006   Past Medical History  Diagnosis Date  . Breast cancer   . Hypertension   . GERD (gastroesophageal reflux disease)   . Headache(784.0)     migraines  . Arthritis   . UTI symptoms     on abx  . PONV (postoperative nausea and vomiting)   . Hyperlipidemia   . Crohn's disease   . Breast cancer   . DJD (degenerative joint disease)   . Squamous cell carcinoma   . Cystitis     Past Surgical History  Procedure Laterality Date  . Total hip arthroplasty Bilateral 1987, 1989, 1997    right  x 2  . Back fusion   2005, 2008, 2011    T4-S1  . Rotator cuff repair Right     x 2  . Breast lumpectomy Left   . Bunionectomy Bilateral   . Hysteroscopy w/d&c N/A 08/15/2013    Procedure: DILATATION AND CURETTAGE /HYSTEROSCOPY WITH RESECTION;  Surgeon: Princess Bruins, MD;  Location: [facility];  Service: Gynecology;  Laterality: N/A;  . Shoulder arthroscopy Left   . Tonsillectomy      x 4, kept growing back  . Thumb arthroscopy Right     left, cyst removal  . Cholecystectomy       (Not in a hospital admission) Allergies  Allergen Reactions  . Sulfa Antibiotics Hives and Rash  . Tincture Of Benzoin [Benzoin] Itching    Burned skin  . Demerol [Meperidine] Itching and Nausea Only    Depressed breathing and slurred speach  . Hydrocodone Itching and Nausea And Vomiting  . Oxycodone Itching and Nausea And Vomiting    History  Substance Use Topics  . Smoking status: Never Smoker   . Smokeless tobacco: Never Used  . Alcohol Use: Yes     Comment: 1 glass weekly    Family History  Problem Relation Age of Onset  . Breast cancer Mother   . Hypertension Father   . Heart attack Father   . Hyperlipidemia Father   . Colon cancer Father   . Hypertension Sister   . Breast cancer Maternal Grandmother   . Stroke Maternal Grandfather   .  Heart attack Paternal Grandmother   . Cervical cancer Paternal Grandmother   . Heart attack Paternal Grandfather       Review of Systems  Constitutional: Negative.   HENT: Negative.   Eyes: Negative.   Respiratory: Negative.   Cardiovascular: Negative.   Gastrointestinal: Negative.   Genitourinary: Negative.   Musculoskeletal: Positive for back pain and joint pain.  Skin: Negative.   Neurological: Negative.   Endo/Heme/Allergies: Negative.   Psychiatric/Behavioral: Negative.     Objective:  Physical Exam  Constitutional: She is oriented to person, place, and time. She appears well-developed and well-nourished.  HENT:  Head: Normocephalic and atraumatic.   Eyes: Conjunctivae and EOM are normal. Pupils are equal, round, and reactive to light.  Neck: Normal range of motion. Neck supple.  Cardiovascular: Normal rate, regular rhythm, normal heart sounds and intact distal pulses.   Respiratory: Effort normal and breath sounds normal.  GI: Soft. Bowel sounds are normal.  Genitourinary:  deferred  Musculoskeletal:       Left hip: She exhibits decreased range of motion.  Neurological: She is alert and oriented to person, place, and time. She has normal reflexes.  Skin: Skin is warm and dry.  Psychiatric: She has a normal mood and affect. Her behavior is normal. Judgment and thought content normal.    Vital signs in last 24 hours: @VSRANGES @   Labs:   Estimated body mass index is 34.76 kg/(m^2) as calculated from the following:   Height as of 07/02/14: 5' (1.524 m).   Weight as of 07/03/14: 80.74 kg (178 lb).  Imaging Review:  There is osteolysis of the acetabular cup.The bone quality appears to be adequate for age and reported activity level. There is eccentric poly wear.  Assessment/Plan:  End stage arthritis, left hip(s) with failed previous arthroplasty.  The patient history, physical examination, clinical judgement of the provider and imaging studies are consistent with end stage degenerative joint disease of the left hip(s), previous total hip arthroplasty. Revision total hip arthroplasty is deemed medically necessary. The treatment options including medical management, injection therapy, arthroscopy and arthroplasty were discussed at length. The risks and benefits of total hip arthroplasty were presented and reviewed. The risks due to aseptic loosening, infection, stiffness, dislocation/subluxation,  thromboembolic complications and other imponderables were discussed.  The patient acknowledged the explanation, agreed to proceed with the plan and consent was signed. Patient is being admitted for inpatient treatment for surgery, pain  control, PT, OT, prophylactic antibiotics, VTE prophylaxis, progressive ambulation and ADL's and discharge planning. The patient is planning to be discharged home with home health services

## 2015-04-22 NOTE — Anesthesia Preprocedure Evaluation (Addendum)
Anesthesia Evaluation  Patient identified by MRN, date of birth, ID band Patient awake    Reviewed: Allergy & Precautions, H&P , NPO status , Patient's Chart, lab work & pertinent test results  History of Anesthesia Complications (+) PONV  Airway Mallampati: II  TM Distance: >3 FB Neck ROM: full    Dental no notable dental hx. (+) Teeth Intact, Dental Advisory Given   Pulmonary neg pulmonary ROS,  breath sounds clear to auscultation  Pulmonary exam normal       Cardiovascular Exercise Tolerance: Good hypertension, Pt. on medications Normal cardiovascular examRhythm:regular Rate:Normal     Neuro/Psych TIA 2 years ago.  No problems since TIAnegative psych ROS   GI/Hepatic negative GI ROS, Neg liver ROS, GERD-  Medicated and Controlled,  Endo/Other  negative endocrine ROSBenign neoplasm adrenal gland  Renal/GU negative Renal ROS  negative genitourinary   Musculoskeletal   Abdominal   Peds  Hematology negative hematology ROS (+)   Anesthesia Other Findings   Reproductive/Obstetrics negative OB ROS                           Anesthesia Physical Anesthesia Plan  ASA: III  Anesthesia Plan: General   Post-op Pain Management:    Induction: Intravenous  Airway Management Planned: Oral ETT  Additional Equipment:   Intra-op Plan:   Post-operative Plan: Extubation in OR  Informed Consent: I have reviewed the patients History and Physical, chart, labs and discussed the procedure including the risks, benefits and alternatives for the proposed anesthesia with the patient or authorized representative who has indicated his/her understanding and acceptance.   Dental Advisory Given  Plan Discussed with: CRNA and Surgeon  Anesthesia Plan Comments:        Anesthesia Quick Evaluation

## 2015-04-22 NOTE — Progress Notes (Signed)
Report called to grace rn [place] d Amarius Toto rn

## 2015-04-22 NOTE — Progress Notes (Signed)
Dr. Landry Dyke made aware of patient's heart rates 101-108- blood pressures stable- 500 cc LR IV Bolus given as ordered.

## 2015-04-22 NOTE — Progress Notes (Signed)
Received pt from [place] , alert and oriented, no complaints of chest pains or any discomfort, family at bedside. Left hip dressing dry and intact. Placed on telemetry  , ST on continous pulse ox Sats 100% on 3L.

## 2015-04-22 NOTE — Progress Notes (Signed)
1640 Dr Lyla Glassing called back with orders received D Mateo Flow RN

## 2015-04-22 NOTE — Progress Notes (Signed)
X-ray results noted 

## 2015-04-22 NOTE — Progress Notes (Signed)
Metoprolol 2.5 mg IVP GIVEN.

## 2015-04-22 NOTE — Interval H&P Note (Signed)
History and Physical Interval Note:  04/22/2015 7:26 AM  [name]  has presented today for surgery, with the diagnosis of failed left total hip  The various methods of treatment have been discussed with the patient and family. After consideration of risks, benefits and other options for treatment, the patient has consented to  Procedure(s): LEFT TOTAL HIP [place] (Left) as a surgical intervention .  The patient's history has been reviewed, patient examined, no change in status, stable for surgery.  I have reviewed the patient's chart and labs.  Questions were answered to the patient's satisfaction.     Murad Staples, Horald Pollen

## 2015-04-22 NOTE — Progress Notes (Signed)
Dr. Landry Dyke made aware of patient's EKG  Readings - heart rates and EKG READINGS - ST with PACS

## 2015-04-22 NOTE — Progress Notes (Signed)
Metoprolol 2.5 mg IVP given.

## 2015-04-22 NOTE — Progress Notes (Signed)
Dr. Landry Dyke made aware of conversation with Dr. Lyla Glassing- Dr. Landry Dyke  To call Dr. Lyla Glassing.

## 2015-04-22 NOTE — Progress Notes (Signed)
Dr. Lyla Glassing called about patient- made aware of patient's condition

## 2015-04-22 NOTE — Progress Notes (Signed)
Metoprolol 2.5 mg IVP GIVEN

## 2015-04-22 NOTE — Transfer of Care (Signed)
Immediate Anesthesia Transfer of Care Note  Patient: [name]  Procedure(s) Performed: Procedure(s): LEFT HIP REVISION ACETABULAR COMPONENT (Left)  Patient Location: PACU  Anesthesia Type:General  Level of Consciousness: awake, alert  and oriented  Airway & Oxygen Therapy: Patient Spontanous Breathing and Patient connected to face mask oxygen  Post-op Assessment: Report given to RN and Post -op Vital signs reviewed and stable  Post vital signs: Reviewed and stable  Last Vitals: There were no vitals filed for this visit.  Complications: No apparent anesthesia complications

## 2015-04-22 NOTE — Anesthesia Postprocedure Evaluation (Signed)
  Anesthesia Post-op Note  Patient: [name]  Procedure(s) Performed: Procedure(s) (LRB): LEFT HIP REVISION ACETABULAR COMPONENT (Left)  Patient Location: PACU  Anesthesia Type: General  Level of Consciousness: awake and alert   Airway and Oxygen Therapy: Patient Spontanous Breathing  Post-op Pain: mild  Post-op Assessment: Post-op Vital signs reviewed, Patient's Cardiovascular Status Stable, Respiratory Function Stable, Patent Airway and No signs of Nausea or vomiting  Last Vitals:  Filed Vitals:   04/22/15 1215  BP: 123/60  Pulse: 107  Temp:   Resp: 18    Post-op Vital Signs: stable   Complications: No apparent anesthesia complications

## 2015-04-22 NOTE — Progress Notes (Signed)
Dr. Landry Dyke made aware of patient's continual heart rates- second 500 CC LR IVB given as ordered- O.K. To go to Telemetry- per Dr. Landry Dyke

## 2015-04-22 NOTE — Progress Notes (Signed)
12 Lead EKG done.

## 2015-04-23 ENCOUNTER — Inpatient Hospital Stay (HOSPITAL_COMMUNITY): Payer: Medicare Other

## 2015-04-23 ENCOUNTER — Encounter (HOSPITAL_COMMUNITY): Payer: Self-pay | Admitting: Orthopedic Surgery

## 2015-04-23 DIAGNOSIS — I2510 Atherosclerotic heart disease of native coronary artery without angina pectoris: Secondary | ICD-10-CM

## 2015-04-23 LAB — BASIC METABOLIC PANEL
Anion gap: 7 (ref 5–15)
BUN: 11 mg/dL (ref 6–20)
CHLORIDE: 105 mmol/L (ref 101–111)
CO2: 26 mmol/L (ref 22–32)
Calcium: 8.5 mg/dL — ABNORMAL LOW (ref 8.9–10.3)
Creatinine, Ser: 0.68 mg/dL (ref 0.44–1.00)
GFR calc Af Amer: >60 mL/min (ref >60)
GFR calc non Af Amer: >60 mL/min (ref >60)
GLUCOSE: 107 mg/dL — AB (ref 65–99)
POTASSIUM: 3.8 mmol/L (ref 3.5–5.1)
Sodium: 138 mmol/L (ref 135–145)

## 2015-04-23 LAB — CBC
HEMATOCRIT: 33.8 % — AB (ref 36.0–46.0)
HEMOGLOBIN: 11.1 g/dL — AB (ref 12.0–15.0)
MCH: 29.5 pg (ref 26.0–34.0)
MCHC: 32.8 g/dL (ref 30.0–36.0)
MCV: 89.9 fL (ref 78.0–100.0)
Platelets: 178 10*3/uL (ref 150–400)
RBC: 3.76 MIL/uL — ABNORMAL LOW (ref 3.87–5.11)
RDW: 12.9 % (ref 11.5–15.5)
WBC: 7.8 10*3/uL (ref 4.0–10.5)

## 2015-04-23 LAB — TROPONIN I
Troponin I: <0.03 ng/mL (ref <0.031)
Troponin I: <0.03 ng/mL (ref <0.031)

## 2015-04-23 NOTE — Evaluation (Signed)
Physical Therapy Evaluation Patient Details Name: [name] MRN: [id number] DOB: [birth year] Today's Date: 04/23/2015   History of Present Illness  Pt is a 69 year old female s/p L THR revision of acetabular componenent  Clinical Impression  Patient is s/p above surgery resulting in functional limitations due to the deficits listed below (see PT Problem List).  Patient will benefit from skilled PT to increase their independence and safety with mobility to allow discharge to the venue listed below.  Pt only able to tolerate [name] distance ambulation due to nausea this morning.  Pt plans to d/c home with assist from family.     Follow Up Recommendations Home health PT    Equipment Recommendations  None recommended by PT    Recommendations for Other Services       Precautions / Restrictions Precautions Precautions: Fall;Posterior Hip Restrictions Weight Bearing Restrictions: No Other Position/Activity Restrictions: WBAT      Mobility  Bed Mobility Overal bed mobility: Needs Assistance Bed Mobility: Supine to Sit     Supine to sit: Min assist Sit to supine: Min assist   General bed mobility comments: assistance for LLE and cues for technique  Transfers Overall transfer level: Needs assistance Equipment used: Rolling walker (2 wheeled) Transfers: Sit to/from Stand Sit to Stand: Min assist         General transfer comment: verbal cues for safe technique within precautions  Ambulation/Gait Ambulation/Gait assistance: Min assist Ambulation Distance (Feet): 20 Feet Assistive device: Rolling walker (2 wheeled) Gait Pattern/deviations: Step-to pattern;Decreased stance time - left;Antalgic Gait velocity: decr   General Gait Details: verbal cues for sequence, RW distance, precautions with turning, limited distance due to nausea  Stairs            Wheelchair Mobility    Modified Rankin (Stroke Patients Only)       Balance                                              Pertinent Vitals/Pain Pain Assessment: 0-10 Pain Score: 7  Pain Location: L hip Pain Descriptors / Indicators: Aching Pain Intervention(s): Limited activity within patient's tolerance;Monitored during session;Repositioned;Patient requesting pain meds-RN notified    Home Living Family/patient expects to be discharged to:: Private residence Living Arrangements: Spouse/significant other Available Help at Discharge: Family Type of Home: House Home Access: Stairs to enter Entrance Stairs-Rails: Psychiatric nurse of Steps: 3 Home Layout: Two level;Able to live on main level with bedroom/bathroom Home Equipment: Gilford Rile - 2 wheels;Walker - standard;Walker - 4 wheels;Bedside commode      Prior Function Level of Independence: Independent               Hand Dominance        Extremity/Trunk Assessment   Upper Extremity Assessment: Overall WFL for tasks assessed           Lower Extremity Assessment: LLE deficits/detail   LLE Deficits / Details: maintained hip precautions, assist for L LE required for bed mobility, functional hip weakness observed     Communication   Communication: No difficulties  Cognition Arousal/Alertness: Awake/alert Behavior During Therapy: WFL for tasks assessed/performed Overall Cognitive Status: Within Functional Limits for tasks assessed                      General Comments      Exercises  Assessment/Plan    PT Assessment Patient needs continued PT services  PT Diagnosis Difficulty walking;Acute pain   PT Problem List Decreased strength;Decreased activity tolerance;Decreased mobility;Decreased knowledge of use of DME;Decreased knowledge of precautions;Pain  PT Treatment Interventions DME instruction;Gait training;Stair training;Functional mobility training;Patient/family education;Therapeutic activities;Therapeutic exercise   PT Goals (Current goals can be found in the  Care Plan section) Acute Rehab PT Goals Patient Stated Goal: be able to go to a play in 3 weeks that is a 3 hour trip away PT Goal Formulation: With patient Time For Goal Achievement: 04/26/15 Potential to Achieve Goals: Good    Frequency 7X/week   Barriers to discharge        Co-evaluation               End of Session Equipment Utilized During Treatment: Gait belt Activity Tolerance: Patient tolerated treatment well Patient left: with call bell/phone within reach;in chair;with family/visitor present Nurse Communication: Mobility status;Precautions         Time: 8270-7867 PT Time Calculation (min) (ACUTE ONLY): 20 min   Charges:   PT Evaluation $Initial PT Evaluation Tier I: 1 Procedure     PT G Codes:        Sebron Mcmahill,KATHrine E 04/23/2015, 12:47 PM Carmelia Bake, PT, DPT 04/23/2015 Pager: [phone]

## 2015-04-23 NOTE — Progress Notes (Signed)
*  PRELIMINARY RESULTS* Echocardiogram 2D Echocardiogram has been performed.  Leavy Cella 04/23/2015, 12:52 PM

## 2015-04-23 NOTE — Op Note (Signed)
[name], [name] NO.:  [id number]  MEDICAL RECORD NO.:  [id number]  LOCATION:  5465                         FACILITY:  [facility]  PHYSICIAN:  Rod Can, MD     DATE OF BIRTH:  03-03-1946  DATE OF PROCEDURE:  04/22/2015 DATE OF DISCHARGE:                              OPERATIVE REPORT   SURGEON:  Rod Can, MD  ASSISTANT:  [name], RNFA.  PREOPERATIVE DIAGNOSIS:  Failed left total hip arthroplasty.  POSTOPERATIVE DIAGNOSIS:  Failed left total hip arthroplasty.  PROCEDURE PERFORMED:  Revision left total hip arthroplasty with head ball and liner exchange.  SPECIMENS:  Left hip synovial fluid for culture.  EXPLANTS: 1. Stryker 26 mm +0 metal femoral head. 2. 10-degree polyethylene liner.  IMPLANTS: 1. 32-mm metal femoral head, Morse taper, +5 mm. 2. Crossfire 10-degree poly liner insert.  ANESTHESIA:  General.  EBL:  150 mL.  BLOOD PRODUCTS:  None.  TUBES AND DRAINS:  None.  DISPOSITION:  Stable to PACU.  INDICATIONS:  The patient is a 69 year old female who underwent primary left total hip arthroplasty by Dr. Shellia [name] in the 1990s.  She had done well for a number of years and recently she developed pain and a feeling of instability in the left hip.  Radiographs revealed that her acetabular and femoral component position was excellent; however, she was found to have severe eccentric polyethylene wear and superior migration of the head.  We discussed the risks, benefits, and alternatives to acetabular component revision and she elected to proceed.  DESCRIPTION OF PROCEDURE IN DETAIL:  The patient was correctly identified in the holding area using two identifiers.  Surgical site was marked by myself.  She was taken to the operating room, placed supine on the operating room table.  General anesthesia was induced.  She was flipped to the lateral decubitus position and secured with a hip positioner.  Axillary roll was placed.   All bony prominences were well padded.  The left hip was prepped and draped in normal sterile surgical fashion.  Time-out was called, verifying the site and site of surgery. She did receive 2 g of Ancef within 60 minutes at the beginning of the procedure.  I began by using approximately one-third of her previous incision.  I excised the scar and created a full-thickness skin flaps. I carried dissection down to the IP band, which I split in line with fibers.  Charnley retractor was placed and then the short external rotators and capsule were taken down in one layer.  I tagged the capsule, the joint fluid was identified.  There was no gross evidence of infection.  I sent joint fluid for culture.  I examined her abductors and she did have a delamination essentially in the sagittal plane of her abductors.  She did have attachment to the trochanter intact.  I dislocated the hip.  I removed the head ball and I inspected her Morse taper, which was intact without any significant damage or corrosion.  I exposed the interface of the femoral component and there was no loosening or instability.  I removed the capsule around the circumference of the socket and I placed the trunnion anteriorly.  I  placed acetabular retractors.  Her liner was intact and I removed it with an osteotome.  The liner exhibited severe superior polyethylene wear.  This was sent off the table.  I debrided the interface of the cup and the host bone and the cup was found to be intact.  I curetted the screw holes within the cup.  I placed a trial head and a trial liner and reduced the hip.  There was excellent stability and soft tissue tension was appropriate with Shuck testing.  I removed the trial head and liner and then I impacted the real head and liner.  The hip was reduced. Stability testing was performed and again extremely stable.  With neutral abduction and 90 degrees of flexion, I was able to internally rotate her 90  degrees without dislocation and full extension and external rotation.  There was no dislocation or impingement.  She was stable in potty position.  I then copiously irrigated the joint with a dilute Betadine solution followed by saline with pulse lavage.  The short external rotators and capsule were repaired with #1 Ethibond and #1 Vicryl sutures.  I repaired the delamination of the abductors with Ethibond suture.  Topical tranexamic acid was applied, and then the IP band was closed with #1 Vicryl and 0 V-Loc suture.  The deep fatty tissue was closed with 2-0 Vicryl.  Deep dermal layer was closed with 2- 0 interrupted Monocryl.  A 3-0 running Monocryl subcuticular stitch was placed followed by Dermabond for the skin.  Once the glue had fully hardened, Aquacel AG dressing was applied.  The patient was then flipped supine, extubated, taken to the PACU in stable condition.  Sponge, needle and instrument counts were correct at the end of the case x2. There were no known complications.  Operative events and findings were discussed with the patient's family. We will allow her to weightbear as tolerated with the walker.  She will observe posterior hip precautions.  We will start her on apixaban for DVT prophylaxis.  She will work with physical and occupational therapy. I will need to see her in the office in 2 weeks.  All questions were solicited and answered to their satisfaction.          ______________________________ Rod Can, MD     BS/MEDQ  D:  04/22/2015  T:  04/23/2015  Job:  [id number]

## 2015-04-23 NOTE — Progress Notes (Signed)
Occupational Therapy Treatment Patient Details Name: [name] MRN: [id number] DOB: 12/16/1945 Today's Date: 04/23/2015    History of present illness Pt is a 69 year old female s/p L THR revision of acetabular componenent   OT comments   pt is making good progress in OT.  Recommend HHOT to further assess shower and IADLs.  Pt recalls precautions but needs cues in functional context  Follow Up Recommendations  Home health OT;Supervision/Assistance - 24 hour    Equipment Recommendations  None recommended by OT    Recommendations for Other Services      Precautions / Restrictions Precautions Precautions: Fall;Posterior Hip Restrictions Other Position/Activity Restrictions: WBAT       Mobility Bed Mobility            General bed mobility comments: oob  Transfers Overall transfer level: Needs assistance Equipment used: Rolling walker (2 wheeled) Transfers: Sit to/from Stand Sit to Stand: Min guard         General transfer comment: vcs for UEs and LE for precautions    Balance                                   ADL                                   Tub/ Shower Transfer: Walk-in shower;Min guard;Ambulation;3 in 1     General ADL Comments: ambulated to bathroom and practiced shower transfer.  Pt has a small stand up shower, and shower head is on back wall.  Recommended positioning seat at 90 degree angle, having hand held shower hanging and holding this while she manages water.  Husband can hang it up later.  He should be nearby initially due to precautions      Vision                     Perception     Praxis      Cognition   Behavior During Therapy: [facility] for tasks assessed/performed Overall Cognitive Status: Within Functional Limits for tasks assessed                       Extremity/Trunk Assessment              Exercises     Shoulder Instructions       General Comments       Pertinent Vitals/ Pain       Pain Score: 5  Pain Location: L hip when standing Pain Descriptors / Indicators: Aching Pain Intervention(s): Limited activity within patient's tolerance;Monitored during session;Premedicated before session;Repositioned;Ice applied  Home Living                                          Prior Functioning/Environment              Frequency Min 2X/week     Progress Toward Goals  OT Goals(current goals can now be found in the care plan section)  Progress towards OT goals: Progressing toward goals  Acute Rehab OT Goals Time For Goal Achievement: 04/30/15  Plan      Co-evaluation                 End  of Session     Activity Tolerance Patient tolerated treatment well   Patient Left in chair;with call bell/phone within reach   Nurse Communication          Time: 1544-1600 OT Time Calculation (min): 16 min  Charges: OT General Charges $OT Visit: 1 Procedure OT Treatments $Self Care/Home Management : 8-22 mins  [name] 04/23/2015, 4:23 PM  Lesle Chris, OTR/L [phone] 04/23/2015

## 2015-04-23 NOTE — Care Management Note (Signed)
Case Management Note  Patient Details  Name: [name] MRN: [id number] Date of Birth: 10/06/1946  Subjective/Objective:  Pt admitted for failed total hip replacement                  Action/Plan:from home going home with [facility]   Expected Discharge Date:                  Expected Discharge Plan:  [place]  In-House Referral:     Discharge planning Services  CM Consult  Post Acute Care Choice:    Choice offered to:  Patient  DME Arranged:    DME Agency:  [facility]  HH Arranged:  RN, PT [facility] Agency:  [place]  Status of Service:  In process, will continue to follow  Medicare Important Message Given:    Date Medicare IM Given:    Medicare IM give by:    Date Additional Medicare IM Given:    Additional Medicare Important Message give by:     If discussed at [facility] of Stay Meetings, dates discussed:    Additional CommentsPurcell Mouton, RN 04/23/2015, 12:37 PM

## 2015-04-23 NOTE — Discharge Instructions (Addendum)
Dr. Rod Can Joint Replacement Specialist [facility] [address], [place], [place] [address] [phone]   TOTAL HIP REPLACEMENT POSTOPERATIVE DIRECTIONS    Hip Rehabilitation, Guidelines Following Surgery   WEIGHT BEARING Weight bearing as tolerated with assist device (walker, cane, etc) as directed, use it as long as suggested by your surgeon or therapist, typically at least 4-6 weeks.  The results of a hip operation are greatly improved after range of motion and muscle strengthening exercises. Follow all safety measures which are given to protect your hip. If any of these exercises cause increased pain or swelling in your joint, decrease the amount until you are comfortable again. Then slowly increase the exercises. Call your caregiver if you have problems or questions.   HOME CARE INSTRUCTIONS  Most of the following instructions are designed to prevent the dislocation of your new hip.  Remove items at home which could result in a fall. This includes throw rugs or furniture in walking pathways.  Continue medications as instructed at time of discharge.  You may have some home medications which will be placed on hold until you complete the course of blood thinner medication.  You may start showering once you are discharged home. Do not remove your dressing. Do not put on socks or shoes without following the instructions of your caregivers.   Sit on chairs with arms. Use the chair arms to help push yourself up when arising.  Arrange for the use of a toilet seat elevator so you are not sitting low.   Walk with walker as instructed.  You may resume a sexual relationship in one month or when given the OK by your caregiver.  Use walker as long as suggested by your caregivers.  You may put full weight on your legs and walk as much as is comfortable. Posterior hip precautions Avoid periods of inactivity such as sitting longer than an hour when not  asleep. This helps prevent blood clots.  You may return to work once you are cleared by [employer], [employer].  Do not drive a car for 6 weeks or until released by your surgeon.  Do not drive while taking narcotics.  Wear elastic stockings for two weeks following surgery during the day but you may remove then at night.  Make sure you keep all of your appointments after your operation with all of your doctors and caregivers. You should call the office at the above phone number and make an appointment for approximately two weeks after the date of your surgery. Please pick up a stool softener and laxative for home use as long as you are requiring pain medications.  ICE to the affected hip every three hours for 30 minutes at a time and then as needed for pain and swelling. Continue to use ice on the hip for pain and swelling from surgery. You may notice swelling that will progress down to the foot and ankle.  This is normal after surgery.  Elevate the leg when you are not up walking on it.   It is important for you to complete the blood thinner medication as prescribed by your doctor.  Continue to use the breathing machine which will help keep your temperature down.  It is common for your temperature to cycle up and down following surgery, especially at night when you are not up moving around and exerting yourself.  The breathing machine keeps your lungs expanded and your temperature down.  RANGE OF MOTION AND STRENGTHENING EXERCISES  These exercises are designed to help you keep full movement of your hip joint. Follow your caregiver's or physical therapist's instructions. Perform all exercises about fifteen times, three times per day or as directed. Exercise both hips, even if you have had only one joint replacement. These exercises can be done on a training (exercise) mat, on the floor, on a table or on a bed. Use whatever works the best and is most comfortable for you. Use music or television while you are  exercising so that the exercises are a pleasant break in your day. This will make your life better with the exercises acting as a break in routine you can look forward to.  Lying on your back, slowly slide your foot toward your buttocks, raising your knee up off the floor. Then slowly slide your foot back down until your leg is straight again.  Lying on your back spread your legs as far apart as you can without causing discomfort.  Lying on your side, raise your upper leg and foot straight up from the floor as far as is comfortable. Slowly lower the leg and repeat.  Lying on your back, tighten up the muscle in the front of your thigh (quadriceps muscles). You can do this by keeping your leg straight and trying to raise your heel off the floor. This helps strengthen the largest muscle supporting your knee.  Lying on your back, tighten up the muscles of your buttocks both with the legs straight and with the knee bent at a comfortable angle while keeping your heel on the floor.   SKILLED REHAB INSTRUCTIONS: If the patient is transferred to a skilled rehab facility following release from the hospital, a list of the current medications will be sent to the facility for the patient to continue.  When discharged from the skilled rehab facility, please have the facility set up the patient's [place] prior to being released. Also, the skilled facility will be responsible for providing the patient with their medications at time of release from the facility to include their pain medication and their blood thinner medication. If the patient is still at the rehab facility at time of the two week follow up appointment, the skilled rehab facility will also need to assist the patient in arranging follow up appointment in our office and any transportation needs.  MAKE SURE YOU:  Understand these instructions.  Will watch your condition.  Will get help right away if you are not doing well or get  worse.  Pick up stool softner and laxative for home use following surgery while on pain medications. Do not remove your dressing. The dressing is waterproof--it is OK to take showers. Continue to use ice for pain and swelling after surgery. Do not use any lotions or creams on the incision until instructed by your surgeon. Total Hip Protocol. POSTERIOR HIP PRECAUTIONS.  Cardiology started you on metoprolol for your heart. Please follow up with your primary care doctor in the next 1-2 weeks.  Information on my medicine - ELIQUIS (apixaban)  This medication education was reviewed with me or my healthcare representative as part of my discharge preparation.  The pharmacist that spoke with me during my hospital stay was:  Clovis Riley, [facility]  Why was Eliquis prescribed for you? Eliquis was prescribed for you to reduce the risk of blood clots forming after orthopedic surgery.    What do You need to know about Eliquis? Take your Eliquis TWICE DAILY - one tablet  in the morning and one tablet in the evening with or without food.  It would be best to take the dose about the same time each day.  If you have difficulty swallowing the tablet whole please discuss with your pharmacist how to take the medication safely.  Take Eliquis exactly as prescribed by your doctor and DO NOT stop taking Eliquis without talking to the doctor who prescribed the medication.  Stopping without other medication to take the place of Eliquis may increase your risk of developing a clot.  After discharge, you should have regular check-up appointments with your healthcare provider that is prescribing your Eliquis.  What do you do if you miss a dose? If a dose of ELIQUIS is not taken at the scheduled time, take it as soon as possible on the same day and twice-daily administration should be resumed.  The dose should not be doubled to make up for a missed dose.  Do not take more than one tablet of ELIQUIS at  the same time.  Important Safety Information A possible side effect of Eliquis is bleeding. You should call your healthcare provider right away if you experience any of the following: ? Bleeding from an injury or your nose that does not stop. ? Unusual colored urine (red or dark brown) or unusual colored stools (red or black). ? Unusual bruising for unknown reasons. ? A serious fall or if you hit your head (even if there is no bleeding).  Some medicines may interact with Eliquis and might increase your risk of bleeding or clotting while on Eliquis. To help avoid this, consult your healthcare provider or pharmacist prior to using any new prescription or non-prescription medications, including herbals, vitamins, non-steroidal anti-inflammatory drugs (NSAIDs) and supplements.  This website has more information on Eliquis (apixaban): http://www.eliquis.com/eliquis/home

## 2015-04-23 NOTE — Progress Notes (Signed)
Physical Therapy Treatment Note    04/23/15 1631  PT Visit Information  Last PT Received On 04/23/15  Assistance Needed +1  History of Present Illness Pt is a 69 year old female s/p L THR revision of acetabular componenent  PT Time Calculation  PT Start Time (ACUTE ONLY) 1459  PT Stop Time (ACUTE ONLY) 1520  PT Time Calculation (min) (ACUTE ONLY) 21 min  Subjective Data  Subjective Pt assisted with ambulating in hallway and performed a couple exercise upon sitting in recliner.  Precautions  Precautions Fall;Posterior Hip  Restrictions  Other Position/Activity Restrictions WBAT  Pain Assessment  Pain Assessment 0-10  Pain Score 6  Pain Location L hip with gait  Pain Descriptors / Indicators Aching;Sore  Pain Intervention(s) Limited activity within patient's tolerance;Monitored during session;Repositioned;Ice applied  Cognition  Arousal/Alertness Awake/alert  Behavior During Therapy WFL for tasks assessed/performed  Overall Cognitive Status Within Functional Limits for tasks assessed  Bed Mobility  Overal bed mobility Needs Assistance  Bed Mobility Supine to Sit  Supine to sit Min guard  General bed mobility comments verbal cues for maintaining precautions, increased effort and pt utilizing bed rail and trapeze  Transfers  Overall transfer level Needs assistance  Equipment used Rolling walker (2 wheeled)  Transfers Sit to/from Stand  Sit to Stand Min guard  General transfer comment vcs for UEs and LE for precautions  Ambulation/Gait  Ambulation/Gait assistance Min guard  Ambulation Distance (Feet) 160 Feet  Assistive device Rolling walker (2 wheeled)  General Gait Details verbal cues for sequence, RW distance, precautions with turning, pt continues to report some nausea  Gait Pattern/deviations Step-to pattern;Step-through pattern;Antalgic  Gait velocity decr  Exercises  Exercises Total Joint  Total Joint Exercises  Ankle Circles/Pumps AROM;Both;10 reps  Quad Sets  AROM;Both;10 reps  Heel Slides AAROM;Left;10 reps;Other (comment) (within precautions)  Hip ABduction/ADduction AAROM;Left;10 reps;Other (comment) (within precautions)  PT - End of Session  Activity Tolerance Patient tolerated treatment well  Patient left in chair;with call bell/phone within reach  PT - Assessment/Plan  PT Plan Current plan remains appropriate  PT Frequency (ACUTE ONLY) 7X/week  Follow Up Recommendations Home health PT  PT equipment None recommended by PT  PT Goal Progression  Progress towards PT goals Progressing toward goals  PT General Charges  $$ ACUTE PT VISIT 1 Procedure  PT Treatments  $Gait Training 8-22 mins   Pt with SPO2 dropping to 86% on room air in supine so applied 2L O2 for activity  (RN in room and aware).  SATURATION QUALIFICATIONS: (This note is used to comply with regulatory documentation for home oxygen)  Patient Saturations on Room Air at Rest = 86%  Patient Saturations on Room Air while Ambulating = N/A  Patient Saturations on 2 Liters of oxygen while Ambulating = 93%  Please briefly explain why patient needs home oxygen: to maintain oxygen saturations above 88% during physical activity such as ambulation.  Carmelia Bake, PT, DPT 04/23/2015 Pager: [phone]

## 2015-04-23 NOTE — Progress Notes (Signed)
Received a call from [place] this RN that pts O2Sats dropped to 82%. Went in pts room , monitor showing 83% on room air. patient not in distress,no complaints of any discomfort,and is  talking to her visitor at this time.. Checked O2 Sats using dinamap Sats 82%. Place back on O2 @ 2L/min, Sats 100%.

## 2015-04-23 NOTE — Progress Notes (Signed)
Patient ID: [name], female   DOB: [birth year], 69 y.o.   MRN: [id number]   SUBJECTIVE: No further chest pain, HR now in 90s.  Troponin negative.   Scheduled Meds: . apixaban  2.5 mg Oral Q12H  . dexamethasone  10 mg Intravenous Once  . docusate sodium  100 mg Oral BID  . losartan  25 mg Oral Daily   And  . hydrochlorothiazide  6.25 mg Oral Daily  . ketorolac  7.5 mg Intravenous Q6H  . metoprolol tartrate  25 mg Oral BID  . [START ON 04/24/2015] nitrofurantoin (macrocrystal-monohydrate)  100 mg Oral QODAY  . pantoprazole  40 mg Oral BID  . senna  2 tablet Oral QHS  . simvastatin  40 mg Oral QPM   Continuous Infusions: . sodium chloride 10 mL/hr at 04/22/15 2200   PRN Meds:.acetaminophen **OR** acetaminophen, diphenhydrAMINE, HYDROcodone-acetaminophen, HYDROmorphone (DILAUDID) injection, loratadine, menthol-cetylpyridinium **OR** phenol, methocarbamol **OR** methocarbamol (ROBAXIN)  IV, metoCLOPramide **OR** metoCLOPramide (REGLAN) injection, nitroGLYCERIN, ondansetron **OR** ondansetron (ZOFRAN) IV    Filed Vitals:   04/22/15 1856 04/22/15 2052 04/23/15 0216 04/23/15 0533  BP: 136/59 109/62 113/70 110/67  Pulse: 104 102 97 81  Temp: 98.7 F (37.1 C) 98.6 F (37 C) 98.3 F (36.8 C) 98.7 F (37.1 C)  TempSrc: Oral Oral Oral Oral  Resp: 18 16 16 16   Height:      Weight:      SpO2: 99% 95% 96% 97%    Intake/Output Summary (Last 24 hours) at 04/23/15 0755 Last data filed at 04/23/15 0643  Gross per 24 hour  Intake   4325 ml  Output   3100 ml  Net   1225 ml    LABS: Basic Metabolic Panel:  Recent Labs  04/22/15 1830 04/23/15 0512  NA 136 138  K 3.8 3.8  CL 102 105  CO2 27 26  GLUCOSE 155* 107*  BUN 13 11  CREATININE 0.56 0.68  CALCIUM 8.4* 8.5*   Liver Function Tests: No results for input(s): AST, ALT, ALKPHOS, BILITOT, PROT, ALBUMIN in the last 72 hours. No results for input(s): LIPASE, AMYLASE in the last 72 hours. CBC:  Recent Labs   04/22/15 1830 04/23/15 0512  WBC 8.1 7.8  HGB 12.4 11.1*  HCT 37.7 33.8*  MCV 89.1 89.9  PLT 186 178   Cardiac Enzymes:  Recent Labs  04/22/15 1715 04/22/15 1830 04/23/15 0005 04/23/15 0512  CKTOTAL 572*  --   --   --   CKMB 15.5*  --   --   --   TROPONINI  --  <0.03 <0.03 <0.03   BNP: Invalid input(s): POCBNP D-Dimer: No results for input(s): DDIMER in the last 72 hours. Hemoglobin A1C: No results for input(s): HGBA1C in the last 72 hours. Fasting Lipid Panel: No results for input(s): CHOL, HDL, LDLCALC, TRIG, CHOLHDL, LDLDIRECT in the last 72 hours. Thyroid Function Tests: No results for input(s): TSH, T4TOTAL, T3FREE, THYROIDAB in the last 72 hours.  Invalid input(s): FREET3 Anemia Panel: No results for input(s): VITAMINB12, FOLATE, FERRITIN, TIBC, IRON, RETICCTPCT in the last 72 hours.  RADIOLOGY: Dg Pelvis Portable  04/22/2015   CLINICAL DATA:  Revision of left hip replacement  EXAM: PORTABLE PELVIS 1-2 VIEWS  COMPARISON:  CT of the thoracolumbar spine of 04/17/2013  FINDINGS: A new left total hip replacement is present without complicating features. Some air is noted in the overlying soft tissues the left hip postoperatively. A right total hip replacement again is noted as is hardware for  fixation of the lower lumbar spine.  IMPRESSION: New left hip replacement without complicating feature.   Electronically Signed   By: Ivar Drape M.D.   On: 04/22/2015 11:08   Dg Chest Port 1 View  04/22/2015   CLINICAL DATA:  Decreased oxygen saturation and chest pain  EXAM: PORTABLE CHEST - 1 VIEW  COMPARISON:  March 06, 2014  FINDINGS: There is rod fixation in the thoracic and visualized lumbar spine. There is a total shoulder replacement on the left. Lungs are clear. Heart size and pulmonary vascularity are normal.  Stomach is distended with air.  There is evidence of old trauma involving the lateral right clavicle. 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   IMPRESSION: No lung edema or consolidation.  Stomach distended with air.   Electronically Signed   By: Lowella Grip III M.D.   On: 04/22/2015 17:54    PHYSICAL EXAM General: NAD Neck: No JVD, no thyromegaly or thyroid nodule.  Lungs: Clear to auscultation bilaterally with normal respiratory effort. CV: Nondisplaced PMI.  Heart regular S1/S2, no S3/S4, no murmur.  No peripheral edema.   Abdomen: Soft, nontender, no hepatosplenomegaly, no distention.  Neurologic: Alert and oriented x 3.  Psych: Normal affect. Extremities: No clubbing or cyanosis.   TELEMETRY: Reviewed telemetry pt in NSR with occasional PACs  ASSESSMENT AND PLAN: Patient has no prior cardiac history. She had a presumed vagal episode with syncope in the past. She had a Cardiolite in 2/15 with no evidence for ischemia. She had a left hip revision surgery on 6/14. Post-op, she felt her heart racing. She was in sinus tachycardia (mild, HR 100s). She says that she was feeling her heart racing even prior to surgery but pre-op ECG was not tachycardic. She vomited x 3, and after vomiting developed central chest pain that only lasted 1 minute. She felt considerably better now after getting Dilaudid, Zofran, and Reglan.   1. Chest pain: I suspect that this was due to vomiting (began after emesis). ECG showed no acute changes compared to prior. Troponin negative.  2. Tachycardia: Mild sinus tachycardia, improved this morning.  She was started on metoprolol yesterday. Possibly due to anxiety, mild dehydration, and pain. No atrial fibrillation on telemetry.    - Will get echocardiogram.  If this looks ok, no further cardiac workup.  - Given history of hypertension and palpitations even prior to this admission, would be reasonable for her to continue metoprolol 25 mg bid at home.   If echo unremarkable, no further cardiac workup at this time.   Loralie Champagne 04/23/2015 7:58 AM

## 2015-04-23 NOTE — Progress Notes (Signed)
Patient complained  felt  pins and needles from head,  fingertips to vaginal area and had flushed face may approximately  10 - 15 sec after giving a dose of Dexamethasone x1 dose given as ordered.,  patient  immediately stated I'm okey now. Placed back on O2 @ 2L v/s checked  BP 122 66 HR 88, O2 Sats 100%. Will monitor accordingly.

## 2015-04-23 NOTE — Evaluation (Signed)
Occupational Therapy Evaluation Patient Details Name: [name] MRN: [id number] DOB: 1946-10-24 Today's Date: 04/23/2015    History of Present Illness s/p L THR, acetabular componenent   Clinical Impression   This 69 year old female was admitted for the above surgery.  She will benefit from skilled OT to reinforce safe bathroom transfers, following precautions.  Pt has AE and is familiar with using this and husband will also assist with adls.  Goals are for supervision to min guard assist for toilet and shower    Follow Up Recommendations  Supervision/Assistance - 24 hour    Equipment Recommendations  None recommended by OT    Recommendations for Other Services       Precautions / Restrictions Precautions Precautions: Fall;Posterior Hip Restrictions Weight Bearing Restrictions: No Other Position/Activity Restrictions: WBAT      Mobility Bed Mobility Overal bed mobility: Needs Assistance Bed Mobility: Sit to Supine       Sit to supine: Min assist   General bed mobility comments: assistance for LLE and cues for technique  Transfers Overall transfer level: Needs assistance Equipment used: Rolling walker (2 wheeled) Transfers: Sit to/from Stand Sit to Stand: Min guard;Min assist         General transfer comment: initial min A from chair and min guard from 3:1.  Cues for UE/LE placement    Balance                                            ADL Overall ADL's : Needs assistance/impaired     Grooming: Set up;Standing   Upper Body Bathing: Set up;Sitting   Lower Body Bathing: Sit to/from stand;With adaptive equipment;Min guard   Upper Body Dressing : Set up;Sitting   Lower Body Dressing: With adaptive equipment;Sit to/from stand;Min guard   Toilet Transfer: Min guard;Ambulation;BSC   Toileting- Water quality scientist and Hygiene: Min guard;Sit to/from stand         General ADL Comments: pt has been using AE--sock aide last  in the winter.  Reviewed posterior precautions for ADLs and mobility.  Ambulated to bathroom to try to use commode.  Pt would like to practice shower transfer, but pain too high right now. Will plan to do this on next visit.  Husband present for part of session     Vision     Perception     Praxis      Pertinent Vitals/Pain Pain Assessment: 0-10 Pain Score: 7  Pain Location: L hip Pain Descriptors / Indicators: Aching Pain Intervention(s): Limited activity within patient's tolerance;Monitored during session;Repositioned;Patient requesting pain meds-RN notified     Hand Dominance     Extremity/Trunk Assessment Upper Extremity Assessment Upper Extremity Assessment: Overall WFL for tasks assessed           Communication Communication Communication: No difficulties   Cognition Arousal/Alertness: Awake/alert Behavior During Therapy: WFL for tasks assessed/performed Overall Cognitive Status: Within Functional Limits for tasks assessed                     General Comments       Exercises       Shoulder Instructions      Home Living Family/patient expects to be discharged to:: Private residence Living Arrangements: Spouse/significant other Available Help at Discharge: Family Type of Home: House Home Access: Stairs to enter [employer] of Steps: 3 Entrance Stairs-Rails: Right;Left  Home Layout: Two level;Able to live on main level with bedroom/bathroom     Bathroom Shower/Tub: Occupational psychologist: Standard     Home Equipment: Environmental consultant - 2 wheels;Walker - standard;Walker - 4 wheels;Bedside commode          Prior Functioning/Environment Level of Independence: Independent             OT Diagnosis: Acute pain   OT Problem List: Decreased activity tolerance;Decreased knowledge of use of DME or AE;Decreased knowledge of precautions;Pain   OT Treatment/Interventions: Self-care/ADL training;DME and/or AE  instruction;Patient/family education    OT Goals(Current goals can be found in the care plan section) Acute Rehab OT Goals Patient Stated Goal: be able to go to a play in 3 weeks that is a 3 hour trip away OT Goal Formulation: With patient Time For Goal Achievement: 04/30/15 Potential to Achieve Goals: Good ADL Goals Pt Will Transfer to Toilet: with supervision;ambulating;bedside commode Pt Will Perform Tub/Shower Transfer: with min guard assist;3 in 1;rolling walker;ambulating;Shower transfer  OT Frequency: Min 2X/week   Barriers to D/C:            Co-evaluation              End of Session Equipment Utilized During Treatment: Surveyor, mining Communication: Patient requests pain meds  Activity Tolerance: No increased pain Patient left: in bed;with call bell/phone within reach   Time: 4765-4650 OT Time Calculation (min): 36 min Charges:  OT General Charges $OT Visit: 1 Procedure OT Evaluation $Initial OT Evaluation Tier I: 1 Procedure OT Treatments $Self Care/Home Management : 8-22 mins G-Codes:    Loreal Schuessler 2015-04-24, 11:56 AM  Lesle Chris, OTR/L [phone] 2015/04/24

## 2015-04-23 NOTE — Progress Notes (Signed)
   Subjective:  Patient reports pain as mild.  Transferred to tele last night and had cardiology eval for CP x1 - likely noncardiac. Denies N/V/CP/SOB currently.  Objective:   VITALS:   Filed Vitals:   04/22/15 1856 04/22/15 2052 04/23/15 0216 04/23/15 0533  BP: 136/59 109/62 113/70 110/67  Pulse: 104 102 97 81  Temp: 98.7 F (37.1 C) 98.6 F (37 C) 98.3 F (36.8 C) 98.7 F (37.1 C)  TempSrc: Oral Oral Oral Oral  Resp: 18 16 16 16   Height:      Weight:      SpO2: 99% 95% 96% 97%    ABD soft Sensation intact distally Intact pulses distally Dorsiflexion/Plantar flexion intact Incision: dressing C/D/I Compartment soft   Lab Results  Component Value Date   WBC 7.8 04/23/2015   HGB 11.1* 04/23/2015   HCT 33.8* 04/23/2015   MCV 89.9 04/23/2015   PLT 178 04/23/2015   BMET    Component Value Date/Time   NA 138 04/23/2015 0512   K 3.8 04/23/2015 0512   CL 105 04/23/2015 0512   CO2 26 04/23/2015 0512   GLUCOSE 107* 04/23/2015 0512   BUN 11 04/23/2015 0512   CREATININE 0.68 04/23/2015 0512   CALCIUM 8.5* 04/23/2015 0512   GFRNONAA >60 04/23/2015 0512   GFRAA >60 04/23/2015 0512     Assessment/Plan: 1 Day Post-Op   Principal Problem:   Failed total hip arthroplasty Active Problems:   Chest pain, moderate coronary artery risk   WBAT with walker Posterior hip precautions PO pain control DVT ppx: apixiban, SCDs, TEDs CP: only 1 episode - present with N/V, Tn (-) x3, cardiology ordered echo PT/OT Dispo: d/c home tomorrow if cardiology w/u (-)   [name], Horald Pollen 04/23/2015, 6:20 AM   Rod Can, MD Cell [phone]

## 2015-04-24 ENCOUNTER — Inpatient Hospital Stay (HOSPITAL_COMMUNITY): Payer: Medicare Other

## 2015-04-24 ENCOUNTER — Encounter (HOSPITAL_COMMUNITY): Payer: Self-pay | Admitting: Radiology

## 2015-04-24 LAB — CBC
HEMATOCRIT: 33.8 % — AB (ref 36.0–46.0)
Hemoglobin: 10.8 g/dL — ABNORMAL LOW (ref 12.0–15.0)
MCH: 29 pg (ref 26.0–34.0)
MCHC: 32 g/dL (ref 30.0–36.0)
MCV: 90.9 fL (ref 78.0–100.0)
Platelets: 163 10*3/uL (ref 150–400)
RBC: 3.72 MIL/uL — AB (ref 3.87–5.11)
RDW: 13 % (ref 11.5–15.5)
WBC: 7 10*3/uL (ref 4.0–10.5)

## 2015-04-24 MED ORDER — ONDANSETRON HCL 4 MG PO TABS: 4.0000 mg | ORAL_TABLET | Freq: Four times a day (QID) | ORAL | Status: DC | PRN

## 2015-04-24 MED ORDER — APIXABAN 2.5 MG PO TABS: 2.5000 mg | ORAL_TABLET | Freq: Two times a day (BID) | ORAL | Status: DC

## 2015-04-24 MED ORDER — METOPROLOL TARTRATE 25 MG PO TABS: 25.0000 mg | ORAL_TABLET | Freq: Two times a day (BID) | ORAL | Status: DC

## 2015-04-24 MED ORDER — HYDROCODONE-ACETAMINOPHEN 5-325 MG PO TABS
1.0000 | ORAL_TABLET | ORAL | Status: DC | PRN
Start: 2015-04-24 — End: 2015-06-16

## 2015-04-24 MED ORDER — IOHEXOL 350 MG/ML SOLN
100.0000 mL | Freq: Once | INTRAVENOUS | Status: AC | PRN
  Administered 2015-04-24: 100 mL via INTRAVENOUS

## 2015-04-24 MED ORDER — SENNA 8.6 MG PO TABS: 2.0000 | ORAL_TABLET | Freq: Every day | ORAL | Status: DC

## 2015-04-24 MED ORDER — DOCUSATE SODIUM 100 MG PO CAPS: 100.0000 mg | ORAL_CAPSULE | Freq: Two times a day (BID) | ORAL | Status: DC

## 2015-04-24 NOTE — Progress Notes (Signed)
Physical Therapy Treatment Note    04/24/15 1500  PT Visit Information  Last PT Received On 04/24/15  Assistance Needed +1  History of Present Illness Pt is a 69 year old female s/p L THR revision of acetabular componenent  PT Time Calculation  PT Start Time (ACUTE ONLY) 1437  PT Stop Time (ACUTE ONLY) 1452  PT Time Calculation (min) (ACUTE ONLY) 15 min  Subjective Data  Subjective Pt agreeable to perform a few exercises while awaiting d/c.  Reviewed all precautions and answered pt's questions about going home with oxygen.  Precautions  Precautions Fall;Posterior Hip  Restrictions  Other Position/Activity Restrictions WBAT  Pain Assessment  Pain Assessment 0-10  Pain Score 5  Pain Location L hip  Pain Descriptors / Indicators Aching;Sore  Pain Intervention(s) Limited activity within patient's tolerance;Monitored during session;Ice applied;Repositioned  Cognition  Arousal/Alertness Awake/alert  Behavior During Therapy WFL for tasks assessed/performed  Overall Cognitive Status Within Functional Limits for tasks assessed  Bed Mobility  Overal bed mobility Needs Assistance  Sit to supine Supervision  General bed mobility comments slow but pt able to self assist back into bed  Transfers  Overall transfer level Needs assistance  Equipment used Rolling walker (2 wheeled)  Transfers Sit to/from Stand  Sit to Stand Supervision  General transfer comment pt standing in room on arrival, verbal cues for controlling descent   Exercises  Exercises Total Joint  Total Joint Exercises  Ankle Circles/Pumps AROM;Both;10 reps  Quad Sets AROM;Both;10 reps  Short Arc Quad AROM;Left;10 reps  Heel Slides AAROM;Left;10 reps;Other (comment) (within precautions)  Hip ABduction/ADduction AAROM;Left;10 reps;Other (comment) (within precautions)  PT - End of Session  Activity Tolerance Patient tolerated treatment well  Patient left in bed;with call bell/phone within reach  PT - Assessment/Plan   PT Plan Current plan remains appropriate  PT Frequency (ACUTE ONLY) 7X/week  Follow Up Recommendations Home health PT  PT equipment None recommended by PT  PT Goal Progression  Progress towards PT goals Progressing toward goals  PT General Charges  $$ ACUTE PT VISIT 1 Procedure  PT Treatments  $Therapeutic Exercise 8-22 mins   Carmelia Bake, PT, DPT 04/24/2015 Pager: [phone]

## 2015-04-24 NOTE — Progress Notes (Signed)
   Subjective:  Patient reports pain as mild.  Had CP briefly this am - cardiology obtained EKG -nl, attributed to anxiety. Echo yesterday nl. Denies N/V/CP/SOB currently. Continues to require O2.  Objective:   VITALS:   Filed Vitals:   04/23/15 1000 04/23/15 1415 04/23/15 2051 04/24/15 0432  BP: 113/64 109/52 110/60 130/68  Pulse: 74 81 72 77  Temp: 98.5 F (36.9 C) 98.3 F (36.8 C) 99.9 F (37.7 C) 98.3 F (36.8 C)  TempSrc: Oral Oral Oral Oral  Resp:  16 16 18   Height:      Weight:      SpO2: 94% 98% 97% 99%    ABD soft Sensation intact distally Intact pulses distally Dorsiflexion/Plantar flexion intact Incision: dressing C/D/I Compartment soft  Bruising to anterior aspect of incision, no hematoma   Lab Results  Component Value Date   WBC 7.0 04/24/2015   HGB 10.8* 04/24/2015   HCT 33.8* 04/24/2015   MCV 90.9 04/24/2015   PLT 163 04/24/2015   BMET    Component Value Date/Time   NA 138 04/23/2015 0512   K 3.8 04/23/2015 0512   CL 105 04/23/2015 0512   CO2 26 04/23/2015 0512   GLUCOSE 107* 04/23/2015 0512   BUN 11 04/23/2015 0512   CREATININE 0.68 04/23/2015 0512   CALCIUM 8.5* 04/23/2015 0512   GFRNONAA >60 04/23/2015 0512   GFRAA >60 04/23/2015 0512     Assessment/Plan: 2 Days Post-Op   Principal Problem:   Failed total hip arthroplasty Active Problems:   Chest pain, moderate coronary artery risk   WBAT with walker Posterior hip precautions PO pain control DVT ppx: apixiban, SCDs, TEDs CP: cardiology attributes to anxiety Hypoxemia: cardiology ordered CT chest to r/o PE, will defer home O2 to cards PT/OT Dispo: d/c home if cardiology w/u (-)   [name], [name] 04/24/2015, 7:55 AM   Rod Can, MD Cell [phone]

## 2015-04-24 NOTE — Progress Notes (Signed)
Pt complained of SSCPressure to Phlebotomist. RN assessed patient and obtained an EKG. EKG showed Sinus Arrythmia. Paged cardio with results who stated to watch the pain and give patient 0.5 mg dilaudid (which had been successful, when patient first had CP after her surgery). RN talked with patient and completed breathing exercises. Pt admitted to anxiety concerning her new dependence on O2. RN explained benefits of having O2 and it possibly leading to her having more energy at home since she might have been hypoxic causing her increased falls and lack of energy. During the conversation and with breathing treatment, patient stated that her CP was relieved. Pt was encouraged to use breathing techniques and call for help if she needs anything. Will inform day shift and MD if he arrived prior to this RN's shift ending. Will continue to monitor.

## 2015-04-24 NOTE — Progress Notes (Signed)
Occupational Therapy Treatment Patient Details Name: KAYLANIE CAPILI MRN: [id number] DOB: [birth year] Today's Date: 04/24/2015    History of present illness Pt is a 69 year old female s/p L THR revision of acetabular componenent   OT comments  Practiced toilet transfers and pt continues to need reinforcement for THPs and overall safety. Recommend HHOT to follow up to continue to reinforce safety and those hip precautions. Pt for d/c today.   Follow Up Recommendations  Home health OT;Supervision/Assistance - 24 hour    Equipment Recommendations  None recommended by OT    Recommendations for Other Services      Precautions / Restrictions Precautions Precautions: Fall;Posterior Hip Precaution Comments: able to state 2/3 precautions but needs cues for following precautions. Reviewed 3/3 Restrictions Weight Bearing Restrictions: No Other Position/Activity Restrictions: WBAT       Mobility Bed Mobility            Transfers Overall transfer level: Needs assistance Equipment used: Rolling walker (2 wheeled) Transfers: Sit to/from Stand Sit to Stand: Min guard         General transfer comment: cues for safety and THPs.    Balance                                   ADL                           Toilet Transfer: Min guard;Ambulation;BSC;RW             General ADL Comments: Discussed shower set up in length and agree that [place] would be helpful in making best recommendations for how to transfer in and out of shower with her unique set up of shower head, etc. Pt in agreement. Practiced to 3in1 with walker and pt needs cues for THPs during functional transfers and for safety with walker at times. She needs cues to remember to extend L LE out in front prior to sit and stand. Discussed safety with toilet hygiene and pt has the toilet aid. O2 sats on 2L at 95% with activity. No complaints of chest discomfort. Noted pt to have L LE turned inward when  OT first arrived so reviewed importance of keeping L LE straight. Pt with concerns about managing O2 tubing at home and discussed that [facility] can address this also.       Vision                     Perception     Praxis      Cognition   Behavior During Therapy: WFL for tasks assessed/performed Overall Cognitive Status: Within Functional Limits for tasks assessed                       Extremity/Trunk Assessment               Exercises     Shoulder Instructions       General Comments      Pertinent Vitals/ Pain       Pain Assessment: 0-10 Pain Score: 4  Pain Location: L hip Pain Descriptors / Indicators: Aching;Sore Pain Intervention(s): Monitored during session;Limited activity within patient's tolerance;Premedicated before session;Repositioned  Home Living  Prior Functioning/Environment              Frequency Min 2X/week     Progress Toward Goals  OT Goals(current goals can now be found in the care plan section)  Progress towards OT goals: Progressing toward goals     Plan Discharge plan remains appropriate    Co-evaluation                 End of Session Equipment Utilized During Treatment: Rolling walker;Oxygen   Activity Tolerance Patient tolerated treatment well   Patient Left in chair;with call bell/phone within reach   Nurse Communication          Time: 1240-1305 OT Time Calculation (min): 25 min  Charges: OT General Charges $OT Visit: 1 Procedure OT Treatments $Therapeutic Activity: 23-37 mins  Jules Schick  [phone] 04/24/2015, 1:28 PM

## 2015-04-24 NOTE — Progress Notes (Signed)
Physical Therapy Treatment Patient Details Name: [name] MRN: [id number] DOB: [birth year] Today's Date: 04/24/2015    History of Present Illness Pt is a 69 year old female s/p L THR revision of acetabular componenent    PT Comments    Pt ambulated in hallway and practiced steps.  Answered all pt questions, reviewed hip precautions, and pt had no further questions/concerns.  Pt SpO2 dropped during ambulation so supplemental oxygen maintained thereafter.  SATURATION QUALIFICATIONS: (This note is used to comply with regulatory documentation for home oxygen)  Patient Saturations on Room Air at Rest = 95%  Patient Saturations on Room Air while Ambulating = 78%  Patient Saturations on 2 Liters of oxygen while Ambulating = 94%  Please briefly explain why patient needs home oxygen: to maintain saturations above 88% during physical activity such as ambulation.    Follow Up Recommendations  Home health PT     Equipment Recommendations  None recommended by PT    Recommendations for Other Services       Precautions / Restrictions Precautions Precautions: Fall;Posterior Hip Precaution Comments: pt able to verbalize precautions, cued during mobility however Restrictions Other Position/Activity Restrictions: WBAT    Mobility  Bed Mobility Overal bed mobility: Needs Assistance Bed Mobility: Supine to Sit     Supine to sit: Min guard     General bed mobility comments: verbal cues for maintaining precautions, given hand to self assist trunk upright  Transfers Overall transfer level: Needs assistance Equipment used: Rolling walker (2 wheeled) Transfers: Sit to/from Stand Sit to Stand: Min guard         General transfer comment: vcs for  LE forward  Ambulation/Gait Ambulation/Gait assistance: Min guard Ambulation Distance (Feet): 200 Feet (total) Assistive device: Rolling walker (2 wheeled) Gait Pattern/deviations: Step-through pattern;Decreased stance time -  left;Antalgic Gait velocity: decr   General Gait Details: verbal cues for precautions with turning, no nausea today, 120 x1 then 80 x1    Stairs Stairs: Yes Stairs assistance: Min guard Stair Management: Step to pattern;Forwards;Two rails Number of Stairs: 4 General stair comments: pt held one rail and PT provided assist with other hand (mimick closer rail), verbal cues for sequence and safety  Wheelchair Mobility    Modified Rankin (Stroke Patients Only)       Balance                                    Cognition Arousal/Alertness: Awake/alert Behavior During Therapy: WFL for tasks assessed/performed Overall Cognitive Status: Within Functional Limits for tasks assessed                      Exercises      General Comments        Pertinent Vitals/Pain Pain Assessment: 0-10 Pain Score: 4  Pain Location: L hip Pain Descriptors / Indicators: Aching;Sore Pain Intervention(s): Monitored during session;Limited activity within patient's tolerance;Premedicated before session;Repositioned    Home Living                      Prior Function            PT Goals (current goals can now be found in the care plan section) Progress towards PT goals: Progressing toward goals    Frequency  7X/week    PT Plan Current plan remains appropriate    Co-evaluation  End of Session Equipment Utilized During Treatment: Gait belt;Oxygen Activity Tolerance: Patient tolerated treatment well Patient left: in chair;with call bell/phone within reach     Time: 1056-1130 PT Time Calculation (min) (ACUTE ONLY): 34 min  Charges:  $Gait Training: 23-37 mins                    G Codes:      Frankey Botting,KATHrine E 05-02-15, 1:13 PM Carmelia Bake, PT, DPT 02-May-2015 Pager: [phone]

## 2015-04-24 NOTE — Discharge Summary (Signed)
Physician Discharge Summary  Patient ID: [name] MRN: [id number] DOB/AGE: [birth year] 69 y.o.  Admit date: 04/22/2015 Discharge date: 04/25/2015  Admission Diagnoses:  Failed total hip arthroplasty  Discharge Diagnoses:  Principal Problem:   Failed total hip arthroplasty Active Problems:   Chest pain, moderate coronary artery risk   Past Medical History  Diagnosis Date  . Hypertension   . GERD (gastroesophageal reflux disease)   . Headache(784.0)     migraines  . Arthritis   . PONV (postoperative nausea and vomiting)   . Hyperlipidemia   . DJD (degenerative joint disease)   . Hx-TIA (transient ischemic attack)     X2  . History of skin cancer   . History of cancer chemotherapy 1996    ALSO RADIATION - BREAST CANCER  . Breast cancer   . Breast cancer   . Squamous cell carcinoma     Surgeries: Procedure(s): LEFT HIP REVISION ACETABULAR COMPONENT on 04/22/2015   Consultants (if any): Treatment Team:  [facility], MD  Discharged Condition: Improved  Hospital Course: [name] is an 69 y.o. female who was admitted 04/22/2015 with a diagnosis of Failed total hip arthroplasty and went to the operating room on 04/22/2015 and underwent the above named procedures.    She was given perioperative antibiotics:      Anti-infectives    Start     Dose/Rate Route Frequency Ordered Stop   04/22/15 1500  ceFAZolin (ANCEF) IVPB 2 g/50 mL premix     2 g 100 mL/hr over 30 Minutes Intravenous Every 6 hours 04/22/15 1413 04/22/15 2045   04/22/15 0558  ceFAZolin (ANCEF) IVPB 2 g/50 mL premix     2 g 100 mL/hr over 30 Minutes Intravenous On call to O.R. 04/22/15 7322 04/22/15 0731    .  She was given sequential compression devices, early ambulation, and apixiban for DVT prophylaxis. She developed chest pain and mild tachycardia on the night of POD#0 and was evaluated by cardiology. Serial Troponins were (-) as was EKG and echocardiogram. She was started on  metoprolol. She developed hypoxemia and a CT chest was obtained which was (-) for PE. She was discharged with home O2 and instructions to follow up with her primary care provider in 1-2 weeks.  She benefited maximally from the hospital stay.  Recent vital signs:  Filed Vitals:   04/24/15 1019  BP: 110/66  Pulse: 68  Temp:   Resp:     Recent laboratory studies:  Lab Results  Component Value Date   HGB 10.8* 04/24/2015   HGB 11.1* 04/23/2015   HGB 12.4 04/22/2015   Lab Results  Component Value Date   WBC 7.0 04/24/2015   PLT 163 04/24/2015   Lab Results  Component Value Date   INR 0.98 04/17/2015   Lab Results  Component Value Date   NA 138 04/23/2015   K 3.8 04/23/2015   CL 105 04/23/2015   CO2 26 04/23/2015   BUN 11 04/23/2015   CREATININE 0.68 04/23/2015   GLUCOSE 107* 04/23/2015    Discharge Medications:     Medication List    STOP taking these medications        acetaminophen-codeine 300-30 MG per tablet  Commonly known as:  TYLENOL #3     celecoxib 200 MG capsule  Commonly known as:  CELEBREX     terbinafine 250 MG tablet  Commonly known as:  LAMISIL      TAKE these medications  ACIDOPHILUS PO  Take 1 tablet by mouth every morning.     apixaban 2.5 MG Tabs tablet  Commonly known as:  ELIQUIS  Take 1 tablet (2.5 mg total) by mouth every 12 (twelve) hours.     Biotin 5000 MCG Caps  Take 1 capsule by mouth at bedtime.     cycloSPORINE 0.05 % ophthalmic emulsion  Commonly known as:  RESTASIS  Place 1 drop into both eyes 2 (two) times daily.     docusate sodium 100 MG capsule  Commonly known as:  COLACE  Take 1 capsule (100 mg total) by mouth 2 (two) times daily.     fluorometholone 0.1 % ophthalmic suspension  Commonly known as:  FML  Place 1 drop into both eyes 2 (two) times daily.     HYDROcodone-acetaminophen 5-325 MG per tablet  Commonly known as:  NORCO/VICODIN  Take 1-2 tablets by mouth every 4 (four) hours as needed  (breakthrough pain).     loratadine 10 MG tablet  Commonly known as:  CLARITIN  Take 10 mg by mouth daily as needed for allergies.     losartan-hydrochlorothiazide 50-12.5 MG per tablet  Commonly known as:  HYZAAR  Take 0.5 tablets by mouth at bedtime.     metoprolol tartrate 25 MG tablet  Commonly known as:  LOPRESSOR  Take 1 tablet (25 mg total) by mouth 2 (two) times daily.     multivitamin with minerals Tabs tablet  Take 1 tablet by mouth every morning. Centrum Silver.     nitrofurantoin (macrocrystal-monohydrate) 100 MG capsule  Commonly known as:  MACROBID  Take 100 mg by mouth every other day. Am.     ondansetron 4 MG tablet  Commonly known as:  ZOFRAN  Take 1 tablet (4 mg total) by mouth every 6 (six) hours as needed for nausea.     pantoprazole 40 MG tablet  Commonly known as:  PROTONIX  Take 1 tablet (40 mg total) by mouth 2 (two) times daily.     senna 8.6 MG Tabs tablet  Commonly known as:  SENOKOT  Take 2 tablets (17.2 mg total) by mouth at bedtime.     simvastatin 40 MG tablet  Commonly known as:  ZOCOR  Take 1 tablet (40 mg total) by mouth every evening.     sucralfate 1 GM/10ML suspension  Commonly known as:  CARAFATE  Take 10 mLs (1 g total) by mouth 2 (two) times daily.     SYSTANE OP  Apply 1 drop to eye 2 (two) times daily.     traMADol 50 MG tablet  Commonly known as:  ULTRAM  Take 1 tablet (50 mg total) by mouth every 6 (six) hours as needed.     VIACTIV PO  Take 1 tablet by mouth every morning.     vitamin C 100 MG tablet  Take 500 mg by mouth every morning.        Diagnostic Studies: Ct Angio Chest Pe W/cm &/or Wo Cm  04/24/2015   CLINICAL DATA:  Chest pain this morning with decreased oxygen saturation yesterday. Status post left hip surgery. Left lumpectomy. Gastroesophageal reflux disease.  EXAM: CT ANGIOGRAPHY CHEST WITH CONTRAST  TECHNIQUE: Multidetector CT imaging of the chest was performed using the standard protocol during bolus  administration of intravenous contrast. Multiplanar CT image reconstructions and MIPs were obtained to evaluate the vascular anatomy.  CONTRAST:  167mL OMNIPAQUE IOHEXOL 350 MG/ML SOLN  COMPARISON:  Plain films 04/22/2015.  FINDINGS: Mediastinum/Nodes: The quality  of this exam for evaluation of pulmonary embolism is good. The bolus is well timed. No evidence of pulmonary embolism. Left axillary node dissection. Tortuous thoracic aorta with atherosclerosis within. Mild cardiomegaly, without pericardial effusion. No mediastinal or hilar adenopathy.  Lungs/Pleura: No pleural fluid. Minimal motion degradation. Degradation also secondary to thoracic spine hardware and left shoulder arthroplasty. right middle lobe volume loss. Patchy right lower lobe airspace disease. Moderate right hemidiaphragm elevation.  Upper abdomen: Cholecystectomy. Normal imaged portions of the liver, spleen, stomach, pancreas.  Musculoskeletal: Right glenohumeral joint osteoarthritis. Left shoulder arthroplasty. A upper thoracic spondylosis is advanced. Lower thoracic spine fixation.  Review of the MIP images confirms the above findings.  IMPRESSION: 1. Mildly motion and hardware artifact degraded exam. No evidence of pulmonary embolism. 2. Mild to moderate right hemidiaphragm elevation, progressive since 06/19/2007. Right middle lobe Volume loss and right lower lobe collapse/consolidative change, also favored to represent atelectasis   Electronically Signed   By: Abigail Miyamoto M.D.   On: 04/24/2015 09:08   Dg Pelvis Portable  04/22/2015   CLINICAL DATA:  Revision of left hip replacement  EXAM: PORTABLE PELVIS 1-2 VIEWS  COMPARISON:  CT of the thoracolumbar spine of 04/17/2013  FINDINGS: A new left total hip replacement is present without complicating features. Some air is noted in the overlying soft tissues the left hip postoperatively. A right total hip replacement again is noted as is hardware for fixation of the lower lumbar spine.   IMPRESSION: New left hip replacement without complicating feature.   Electronically Signed   By: Ivar Drape M.D.   On: 04/22/2015 11:08   Dg Chest Port 1 View  04/22/2015   CLINICAL DATA:  Decreased oxygen saturation and chest pain  EXAM: PORTABLE CHEST - 1 VIEW  COMPARISON:  March 06, 2014  FINDINGS: There is rod fixation in the thoracic and visualized lumbar spine. There is a total shoulder replacement on the left. Lungs are clear. Heart size and pulmonary vascularity are normal.  Stomach is distended with air.  There is evidence of old trauma involving the lateral right clavicle. 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  IMPRESSION: No lung edema or consolidation.  Stomach distended with air.   Electronically Signed   By: Lowella Grip III M.D.   On: 04/22/2015 17:54    Disposition: 06-Home-Health Care Svc  Discharge Instructions    Call MD / Call 911    Complete by:  As directed   If you experience chest pain or shortness of breath, CALL 911 and be transported to the hospital emergency room.  If you develope a fever above 101 F, pus (white drainage) or increased drainage or redness at the wound, or calf pain, call your surgeon's office.     Constipation Prevention    Complete by:  As directed   Drink plenty of fluids.  Prune juice may be helpful.  You may use a stool softener, such as Colace (over the counter) 100 mg twice a day.  Use MiraLax (over the counter) for constipation as needed.     Diet - low sodium heart healthy    Complete by:  As directed      Driving restrictions    Complete by:  As directed   No driving for 6 weeks     Follow the hip precautions as taught in Physical Therapy    Complete by:  As directed      Increase activity slowly as tolerated    Complete  by:  As directed      Lifting restrictions    Complete by:  As directed   No lifting for 6 weeks     TED hose    Complete by:  As directed   Use stockings (TED hose) for 2 weeks on both  leg(s).  You may remove them at night for sleeping.           Follow-up Information    Follow up with Laconya Clere, Horald Pollen, MD. Go on 05/06/2015.   Specialty:  Orthopedic Surgery   Why:  at 2:30pm    For Post Hospitalization Follow Up and wound recheck   Contact information:   [address] [address] [phone]     Follow up with Primary Care doctor in 1-2 weeks.   Signed: Elie Goody 04/25/2015, 9:49 AM

## 2015-04-24 NOTE — Progress Notes (Signed)
Patient ID: [name], female   DOB: 02/17/1946, 69 y.o.   MRN: [id number]   SUBJECTIVE: Oxygen saturation was in the 80s on room air yesterday, now 99% on 2L [place].  She had another episode of chest pain this morning, attributes it to anxiety.  Currently chest pain-free.  Pain was not pleuritic. Echocardiogram yesterday showed normal EF, no wall motion abnormalities.  ECG from this morning appeared normal.    Scheduled Meds: . apixaban  2.5 mg Oral Q12H  . docusate sodium  100 mg Oral BID  . losartan  25 mg Oral Daily   And  . hydrochlorothiazide  6.25 mg Oral Daily  . metoprolol tartrate  25 mg Oral BID  . nitrofurantoin (macrocrystal-monohydrate)  100 mg Oral QODAY  . pantoprazole  40 mg Oral BID  . senna  2 tablet Oral QHS  . simvastatin  40 mg Oral QPM   Continuous Infusions: . sodium chloride 10 mL/hr at 04/22/15 2200   PRN Meds:.acetaminophen **OR** acetaminophen, diphenhydrAMINE, HYDROcodone-acetaminophen, HYDROmorphone (DILAUDID) injection, loratadine, menthol-cetylpyridinium **OR** phenol, methocarbamol **OR** methocarbamol (ROBAXIN)  IV, metoCLOPramide **OR** metoCLOPramide (REGLAN) injection, nitroGLYCERIN, ondansetron **OR** ondansetron (ZOFRAN) IV    Filed Vitals:   04/23/15 1000 04/23/15 1415 04/23/15 2051 04/24/15 0432  BP: 113/64 109/52 110/60 130/68  Pulse: 74 81 72 77  Temp: 98.5 F (36.9 C) 98.3 F (36.8 C) 99.9 F (37.7 C) 98.3 F (36.8 C)  TempSrc: Oral Oral Oral Oral  Resp:  16 16 18   Height:      Weight:      SpO2: 94% 98% 97% 99%    Intake/Output Summary (Last 24 hours) at 04/24/15 0715 Last data filed at 04/23/15 1800  Gross per 24 hour  Intake    840 ml  Output    150 ml  Net    690 ml    LABS: Basic Metabolic Panel:  Recent Labs  04/22/15 1830 04/23/15 0512  NA 136 138  K 3.8 3.8  CL 102 105  CO2 27 26  GLUCOSE 155* 107*  BUN 13 11  CREATININE 0.56 0.68  CALCIUM 8.4* 8.5*   Liver Function Tests: No results for input(s):  AST, ALT, ALKPHOS, BILITOT, PROT, ALBUMIN in the last 72 hours. No results for input(s): LIPASE, AMYLASE in the last 72 hours. CBC:  Recent Labs  04/23/15 0512 04/24/15 0517  WBC 7.8 7.0  HGB 11.1* 10.8*  HCT 33.8* 33.8*  MCV 89.9 90.9  PLT 178 163   Cardiac Enzymes:  Recent Labs  04/22/15 1715 04/22/15 1830 04/23/15 0005 04/23/15 0512  CKTOTAL 572*  --   --   --   CKMB 15.5*  --   --   --   TROPONINI  --  <0.03 <0.03 <0.03   BNP: Invalid input(s): POCBNP D-Dimer: No results for input(s): DDIMER in the last 72 hours. Hemoglobin A1C: No results for input(s): HGBA1C in the last 72 hours. Fasting Lipid Panel: No results for input(s): CHOL, HDL, LDLCALC, TRIG, CHOLHDL, LDLDIRECT in the last 72 hours. Thyroid Function Tests: No results for input(s): TSH, T4TOTAL, T3FREE, THYROIDAB in the last 72 hours.  Invalid input(s): FREET3 Anemia Panel: No results for input(s): VITAMINB12, FOLATE, FERRITIN, TIBC, IRON, RETICCTPCT in the last 72 hours.  RADIOLOGY: Dg Pelvis Portable  04/22/2015   CLINICAL DATA:  Revision of left hip replacement  EXAM: PORTABLE PELVIS 1-2 VIEWS  COMPARISON:  CT of the thoracolumbar spine of 04/17/2013  FINDINGS: A new left total hip replacement is present  without complicating features. Some air is noted in the overlying soft tissues the left hip postoperatively. A right total hip replacement again is noted as is hardware for fixation of the lower lumbar spine.  IMPRESSION: New left hip replacement without complicating feature.   Electronically Signed   By: Ivar Drape M.D.   On: 04/22/2015 11:08   Dg Chest Port 1 View  04/22/2015   CLINICAL DATA:  Decreased oxygen saturation and chest pain  EXAM: PORTABLE CHEST - 1 VIEW  COMPARISON:  March 06, 2014  FINDINGS: There is rod fixation in the thoracic and visualized lumbar spine. There is a total shoulder replacement on the left. Lungs are clear. Heart size and pulmonary vascularity are normal.  Stomach is  distended with air.  There is evidence of old trauma involving the lateral right clavicle. 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  IMPRESSION: No lung edema or consolidation.  Stomach distended with air.   Electronically Signed   By: Lowella Grip III M.D.   On: 04/22/2015 17:54    PHYSICAL EXAM General: NAD Neck: No JVD, no thyromegaly or thyroid nodule.  Lungs: Clear to auscultation bilaterally with normal respiratory effort. CV: Nondisplaced PMI.  Heart regular S1/S2, no S3/S4, no murmur.  No peripheral edema.   Abdomen: Soft, nontender, no hepatosplenomegaly, no distention.  Neurologic: Alert and oriented x 3.  Psych: Normal affect. Extremities: No clubbing or cyanosis.   TELEMETRY: Reviewed telemetry pt in NSR with occasional PACs  ASSESSMENT AND PLAN: Patient has no prior cardiac history. She had a presumed vagal episode with syncope in the past. She had a Cardiolite in 2/15 with no evidence for ischemia. She had a left hip revision surgery on 6/14. Post-op, she felt her heart racing. She was in sinus tachycardia (mild, HR 100s). She says that she was feeling her heart racing even prior to surgery but pre-op ECG was not tachycardic. She vomited x 3, and after vomiting developed central chest pain that only lasted 1 minute. She felt considerably better now after getting Dilaudid, Zofran, and Reglan.   1. Chest pain: I thought that the initial episode was due to vomiting (began after emesis). ECG showed no acute changes compared to prior. Troponin negative. She had another episode this morning, ECG again normal.  Echo yesterday showed normal EF, no wall motion abnormalities.  - Given hypoxemia (oxygen saturation in 80s yesterday) + chest pain, will arrange for CTA chest to rule out PE.  Hypoxemia yesterday seems most likely due to post-op atelectasis.  Oxygen saturation 99% on 2L this morning.  2. Tachycardia: Mild sinus tachycardia initially post-op,  now HR 70s.  She is on metoprolol. Possibly due to anxiety, mild dehydration, and pain. No atrial fibrillation on telemetry.    - Given history of hypertension and palpitations even prior to this admission, would be reasonable for her to continue metoprolol 25 mg bid at home.   Loralie Champagne 04/24/2015 7:15 AM

## 2015-04-25 LAB — BODY FLUID CULTURE: Culture: NO GROWTH

## 2015-04-27 LAB — ANAEROBIC CULTURE

## 2015-04-28 ENCOUNTER — Telehealth: Payer: Self-pay | Admitting: Internal Medicine

## 2015-04-28 ENCOUNTER — Telehealth: Payer: Self-pay | Admitting: Interventional Cardiology

## 2015-04-28 NOTE — Telephone Encounter (Signed)
Returned pt call. Pt is concerned that she was started on Metoprolol and 2liters of O2 after d/c from [facility]. Adv her that her O2 stat on room air dropped to 83% and her heat rate was in the 110's on telemetry. That is why oxygen and betablocker were prescribed.  She has not picked up the Rx for Metoprolol she wants Dr.Smith recommendation on whether or not she should start it. She does have a pulse oximetry at home, she reports that he heartrate has been in the 90's, her O2 in the lower 90's on room air.she denies palpitations. She has an appt with her pcp Dr.Harris on Thurs 6/23. Adv her to follow his recommendation.  She would prefer Dr.Smith recommendation. Adv her I would fwd him a message and call back with his recommendation

## 2015-04-28 NOTE — Telephone Encounter (Signed)
New message   Surgery on last Tuesday.   Sent home on Thursday night. Was sent home with oxygen. Prescribe with beta blocker.    Pt C/O medication issue:  1. Name of Medication: metoprolol tartrate  25 mg   2. How are you currently taking this medication (dosage and times per day)? Have not taken medication yet - prescribe 60 pills   3. Are you having a reaction (difficulty breathing--STAT)? No   4. What is your medication issue? Wants to discuss prior to taken medication. And oxygen level.

## 2015-04-29 NOTE — Telephone Encounter (Signed)
Should continue the prescribed therapy until she is better or I feel there is reason to chage. Can't decide this based on phone call. See what Dr. Kenton Kingfisher says.

## 2015-04-30 NOTE — Telephone Encounter (Signed)
Pt aware of Dr.Smith's response. Should continue the prescribed therapy until she is better or I feel there is reason to change. Can't decide this based on a phone call. See what Dr. Kenton Kingfisher says. Pt verbalized understanding.

## 2015-05-05 ENCOUNTER — Other Ambulatory Visit: Payer: Self-pay

## 2015-05-08 ENCOUNTER — Ambulatory Visit (INDEPENDENT_AMBULATORY_CARE_PROVIDER_SITE_OTHER): Payer: Medicare Other | Admitting: Physician Assistant

## 2015-05-08 ENCOUNTER — Encounter: Payer: Self-pay | Admitting: Physician Assistant

## 2015-05-08 VITALS — BP 130/80 | HR 90 | Ht 60.0 in | Wt 172.4 lb

## 2015-05-08 DIAGNOSIS — R0902 Hypoxemia: Secondary | ICD-10-CM | POA: Diagnosis not present

## 2015-05-08 DIAGNOSIS — I471 Supraventricular tachycardia: Secondary | ICD-10-CM | POA: Diagnosis not present

## 2015-05-08 DIAGNOSIS — R079 Chest pain, unspecified: Secondary | ICD-10-CM | POA: Diagnosis not present

## 2015-05-08 DIAGNOSIS — R000 Tachycardia, unspecified: Secondary | ICD-10-CM

## 2015-05-08 MED ORDER — METOPROLOL TARTRATE 25 MG PO TABS: 25.0000 mg | ORAL_TABLET | Freq: Two times a day (BID) | ORAL | Status: DC

## 2015-05-08 NOTE — Patient Instructions (Signed)
Medication Instructions:   START LOPRESSOR 25MG  TAKE TWICE DAILY  Labwork:  NONE  Testing/Procedures:  NONE  Follow-Up:  3 MONTHS WITH DR. [name]

## 2015-05-08 NOTE — Progress Notes (Signed)
Cardiology Office Note   Date:  05/08/2015   ID:  [name], DOB August 28, 1946, MRN [id number]  PCP:  Shirline Frees, MD  Cardiologist:   Dr. Oscar La, PA-C   Chief Complaint  Patient presents with  . Follow-up    Had chest pain today while doing PT lasting @ a minute with no radiation and no other symptoms.    History of Present Illness: [name] is a 69 y.o. female with a history of syncope and DOE (reasons she saw Dr [name]), HTN, HL, and obesity. In 2015, she had a MV that was Low risk, EF 58% and no further cardiac workup was indicated.   she was admitted to the hospital 06/14 through 06/17 for a total hip arthroplasty which failed. While there, she was evaluated by cardiology for chest pain and was also placed on oxygen for hypoxia.  Shea Stakes presents for  She was here today for follow-up and also had some chest pain during rehabilitation.    the chest pain resolved without intervention. She does not routinely get it with exertion. She has been noticing an increase in her resting heart rate and has data regarding her heart rate, blood pressure, and oxygen saturation. 1 reading of her heart rate was less than 60 but most are greater than 75. Her blood pressure has not been low. Her oxygen saturation has been down in the 80s at times.    1. She wants to know if she can stop her home oxygen   2. She wants to know she should start a beta blocker.   3. She wants to know if she can stop the losartan if she starts on the beta blocker   Past Medical History  Diagnosis Date  . Hypertension   . GERD (gastroesophageal reflux disease)   . Headache(784.0)     migraines  . Arthritis   . PONV (postoperative nausea and vomiting)   . Hyperlipidemia   . DJD (degenerative joint disease)   . Hx-TIA (transient ischemic attack)     X2  . History of skin cancer   . History of cancer chemotherapy 1996    ALSO RADIATION - BREAST CANCER  . Breast  cancer   . Breast cancer   . Squamous cell carcinoma     Past Surgical History  Procedure Laterality Date  . Total hip arthroplasty Bilateral 1987, 1989, 1997    right  x 2  . Back fusion  2005, 2008, 2011    T4-S1  . Rotator cuff repair Right     x 2  . Breast lumpectomy Left   . Bunionectomy Bilateral   . Hysteroscopy w/d&c N/A 08/15/2013    Procedure: DILATATION AND CURETTAGE /HYSTEROSCOPY WITH RESECTION;  Surgeon: Princess Bruins, MD;  Location: [facility];  Service: Gynecology;  Laterality: N/A;  . Shoulder arthroscopy Left   . Tonsillectomy      x 4, kept growing back  . Thumb arthroscopy Right     left, cyst removal  . Cholecystectomy    . Nm myoview ltd  12/13/2013    Low risk study, no areas of significant ischemia. Mild apical, distal anterolateral wall perfusion defect is mostly fixed and likely represents attenuation artifact. EF 58%.  . Total hip revision Left 04/22/2015    Procedure: LEFT HIP REVISION ACETABULAR COMPONENT;  Surgeon: Rod Can, MD;  Location: [facility];  Service: Orthopedics;  Laterality: Left;    Current Outpatient Prescriptions  Medication  Sig Dispense Refill  . apixaban (ELIQUIS) 2.5 MG TABS tablet Take 1 tablet (2.5 mg total) by mouth every 12 (twelve) hours. 60 tablet 0  . Ascorbic Acid (VITAMIN C) 100 MG tablet Take 500 mg by mouth every morning.     . Biotin 5000 MCG CAPS Take 1 capsule by mouth at bedtime.     . Calcium-Vitamin D-Vitamin K (VIACTIV PO) Take 1 tablet by mouth every morning.     . cycloSPORINE (RESTASIS) 0.05 % ophthalmic emulsion Place 1 drop into both eyes 2 (two) times daily.    . fluorometholone (FML) 0.1 % ophthalmic suspension Place 1 drop into both eyes 2 (two) times daily.    Marland Kitchen HYDROcodone-acetaminophen (NORCO/VICODIN) 5-325 MG per tablet Take 1-2 tablets by mouth every 4 (four) hours as needed (breakthrough pain). 80 tablet 0  . Lactobacillus (ACIDOPHILUS PO) Take 1 tablet by mouth every morning.     . loratadine  (CLARITIN) 10 MG tablet Take 10 mg by mouth daily as needed for allergies.     Marland Kitchen losartan-hydrochlorothiazide (HYZAAR) 50-12.5 MG per tablet Take 0.5 tablets by mouth at bedtime.     . Multiple Vitamin (MULTIVITAMIN WITH MINERALS) TABS tablet Take 1 tablet by mouth every morning. Centrum Silver.    . nitrofurantoin, macrocrystal-monohydrate, (MACROBID) 100 MG capsule Take 100 mg by mouth every other day. Am.    . pantoprazole (PROTONIX) 40 MG tablet Take 1 tablet (40 mg total) by mouth 2 (two) times daily. (Patient taking differently: Take 40 mg by mouth every morning. ) 60 tablet 3  . Polyethyl Glycol-Propyl Glycol (SYSTANE OP) Apply 1 drop to eye 2 (two) times daily.    . simvastatin (ZOCOR) 40 MG tablet Take 1 tablet (40 mg total) by mouth every evening. 30 tablet 0  . metoprolol tartrate (LOPRESSOR) 25 MG tablet Take 1 tablet (25 mg total) by mouth 2 (two) times daily. 180 tablet 3   No current facility-administered medications for this visit.    Allergies:   Sulfa antibiotics; Tincture of benzoin; Demerol; Dexamethasone; and Oxycodone    Social History:  The patient  reports that she has never smoked. She has never used smokeless tobacco. She reports that she drinks alcohol. She reports that she does not use illicit drugs.   Family History:  The patient's family history includes Breast cancer in her maternal grandmother and mother; Cervical cancer in her paternal grandmother; Colon cancer in her father; Heart attack in her father, paternal grandfather, and paternal grandmother; Hyperlipidemia in her father; Hypertension in her father and sister; Stroke in her maternal grandfather.    ROS:  Please see the history of present illness. All other systems are reviewed and negative.   From her records: HR 55 once, generally >75, SBP 97 (once 06/19) to 136. The 136 was most recent. BPs trending up in the last week. Oxygen levels on room air 89>>99, with and without exertion  PHYSICAL EXAM: VS:   BP 130/80 mmHg  Pulse 90  Ht 5' (1.524 m)  Wt 172 lb 6.4 oz (78.2 kg)  BMI 33.67 kg/m2 , BMI Body mass index is 33.67 kg/(m^2). GEN: Well nourished, well developed, in no acute distress HEENT: normal Neck: no JVD, carotid bruits, or masses Cardiac: RRR; no murmurs, rubs, or gallops,no edema. Distal pulses are intact in all 4 extremities  Respiratory:  clear to auscultation bilaterally, normal work of breathing GI: soft, nontender, nondistended, + BS MS: no deformity or atrophy Skin: warm and dry, no rash Neuro:  Strength and sensation are intact Psych: euthymic mood, full affect   EKG:  EKG is ordered today. The ekg ordered today demonstrates  Sinus rhythm, rate 90 , no acute ischemic changes   Recent Labs: 04/17/2015: ALT 15 04/23/2015: BUN 11; Creatinine, Ser 0.68; Potassium 3.8; Sodium 138 04/24/2015: Hemoglobin 10.8*; Platelets 163    Lipid Panel No results found for: CHOL, TRIG, HDL, CHOLHDL, VLDL, LDLCALC, LDLDIRECT   Wt Readings from Last 3 Encounters:  05/08/15 172 lb 6.4 oz (78.2 kg)  04/22/15 174 lb (78.926 kg)  04/17/15 174 lb (78.926 kg)     Other studies Reviewed: Additional studies/ records that were reviewed today include:  Hospital records , echocardiogram and prior ECG.  ASSESSMENT AND PLAN:  1.   Chest pain: her EF was normal by echocardiogram 2 weeks ago. Her symptoms are not consistent with exertion and resolved without intervention. At this time, we will start a beta blocker and consider stress testing if she continues to have symptoms.   2. Tachycardia: advised her I would prefer if she started the beta blocker and she agrees. A prescription was sent in.  Advised her I felt her blood pressure was high enough that she should continue the losartan and at the metoprolol. She is aware that if her blood pressure drops or she becomes bradycardic she should call us and we will adjust the medications.   3. Hypoxia: her records showing normal oxygen saturation  were mostly at rest. When she ambulated for 3 minutes, her oxygen saturation drop down to 84%. I requested she continue the oxygen and follow up with Dr. Lamonte Sakai as scheduled.   Current medicines are reviewed at length with the patient today.  The patient has concerns regarding medicines. These were dressed  The following changes have been made:   Add metoprolol 25 mg twice a day   Orders Placed This Encounter  Procedures  . EKG 12-Lead     Disposition:   FU with  Dr. [name] in 3 months.  Augusto Garbe  05/08/2015 7:20 PM    [facility] [place], [place], [place]  [address] Phone: [phone]; Fax: [phone]

## 2015-05-20 ENCOUNTER — Encounter: Payer: Self-pay | Admitting: Internal Medicine

## 2015-05-20 ENCOUNTER — Ambulatory Visit (INDEPENDENT_AMBULATORY_CARE_PROVIDER_SITE_OTHER)
Admission: RE | Admit: 2015-05-20 | Discharge: 2015-05-20 | Disposition: A | Discharge status: Home / Self Care | Payer: Medicare Other | Source: Ambulatory Visit | Attending: Internal Medicine | Admitting: Internal Medicine

## 2015-05-20 ENCOUNTER — Ambulatory Visit (INDEPENDENT_AMBULATORY_CARE_PROVIDER_SITE_OTHER): Payer: Medicare Other | Admitting: Internal Medicine

## 2015-05-20 VITALS — BP 122/80 | HR 67 | Ht 60.0 in | Wt 172.0 lb

## 2015-05-20 DIAGNOSIS — J9600 Acute respiratory failure, unspecified whether with hypoxia or hypercapnia: Secondary | ICD-10-CM | POA: Insufficient documentation

## 2015-05-20 DIAGNOSIS — J9601 Acute respiratory failure with hypoxia: Secondary | ICD-10-CM

## 2015-05-20 DIAGNOSIS — E669 Obesity, unspecified: Secondary | ICD-10-CM

## 2015-05-20 HISTORY — DX: Acute respiratory failure, unspecified whether with hypoxia or hypercapnia: J96.00

## 2015-05-20 NOTE — Progress Notes (Signed)
Quick Note:  Spoke with pt and notified of results per Dr. Wert. Pt verbalized understanding and denied any questions.  ______ 

## 2015-05-20 NOTE — Patient Instructions (Addendum)
Incentive spirometry x 4 in a row   - relax between breaths and try to inhale deeper on every subsequent breaths   Try to minimize use of narcotics if possible   You do not need any 02 at this point and we will cancel it  Pulmonary follow up is as needed  Late add: rec against using macrodantin

## 2015-05-20 NOTE — Progress Notes (Signed)
   Subjective:    Patient ID: [name], female    DOB: Dec 14, 1945,    MRN: [id number]  HPI  63 yowf never smoker never respiratory problems despite multiple back surgeries for scoliosis 2005 -10  with post op resp failure p L hip repair/revision 04/22/15 of previous THR 1989  Referred by Lester Kinsman for acute resp failure/ 02 dep dep.   05/20/2015 1st [facility] office visit/ Wert   Chief Complaint  Patient presents with  . PULMONARY CONSULT    Referred by Lester Kinsman for hypoxia. Recent cxr and CT.   fine walking in house but [employer] walking will drop down to 80's / no symptoms correlate.  No obvious day to day or daytime variability or assoc chronic cough or classically pleuritic or ex cp or chest tightness, subjective wheeze or overt sinus or hb symptoms. No unusual exp hx or h/o childhood pna/ asthma or knowledge of premature birth.  Sleeping ok without nocturnal  or early am exacerbation  of respiratory  c/o's or need for noct saba. Also denies any obvious fluctuation of symptoms with weather or environmental changes or other aggravating or alleviating factors except as outlined above   Current Medications, Allergies, Complete Past Medical History, Past Surgical History, Family History, and Social History were reviewed in [employer] record.     Review of Systems  Constitutional: Negative for fever and unexpected weight change.  HENT: Negative for congestion, dental problem, ear pain, nosebleeds, postnasal drip, rhinorrhea, sinus pressure, sneezing, sore throat and trouble swallowing.   Eyes: Negative for redness and itching.  Respiratory: Negative for cough, chest tightness, shortness of breath and wheezing.   Cardiovascular: Positive for chest pain ( x 4 weeks). Negative for palpitations and leg swelling.  Gastrointestinal: Negative for nausea and vomiting.  Genitourinary: Negative for dysuria.  Musculoskeletal: Positive for  arthralgias. Negative for joint swelling.  Skin: Negative for rash.  Neurological: Negative for headaches.  Hematological: Does not bruise/bleed easily.  Psychiatric/Behavioral: Negative for dysphoric mood. The patient is not nervous/anxious.        Objective:   Physical Exam  Pleasant amb wf nad  Wt Readings from Last 3 Encounters:  05/20/15 172 lb (78.019 kg)  05/08/15 172 lb 6.4 oz (78.2 kg)  04/22/15 174 lb (78.926 kg)    Vital signs reviewed   HEENT: nl dentition, turbinates, and orophanx. Nl external ear canals without cough reflex   NECK :  without JVD/Nodes/TM/ nl carotid upstrokes bilaterally   LUNGS: no acc muscle use, clear to A and P bilaterally without cough on insp or exp maneuvers   CV:  RRR  no s3 or murmur or increase in P2, no edema   ABD:  soft and nontender with nl excursion in the supine position. No bruits or organomegaly, bowel sounds nl  MS:  warm without deformities, calf tenderness, cyanosis or clubbing  SKIN: warm and dry without lesions    NEURO:  alert, approp, no deficits     CXR PA and Lateral:   05/20/2015 :     I personally reviewed images and agree with radiology impression as follows:    The cardiac silhouette, mediastinal and hilar contours are within normal limits and stable. There is mild tortuosity of the thoracic aorta. The lungs are clear. Stable Harrington rods.      Assessment & Plan:

## 2015-05-21 ENCOUNTER — Telehealth: Payer: Self-pay | Admitting: *Deleted

## 2015-05-21 ENCOUNTER — Encounter: Payer: Self-pay | Admitting: Internal Medicine

## 2015-05-21 DIAGNOSIS — E669 Obesity, unspecified: Secondary | ICD-10-CM

## 2015-05-21 HISTORY — DX: Obesity, unspecified: E66.9

## 2015-05-21 NOTE — Assessment & Plan Note (Signed)
Onset 04/22/15 sp L THR CTa chest 04/22/15 1. Mildly motion and hardware artifact degraded exam. No evidence of pulmonary embolism. 2. Mild to moderate right hemidiaphragm elevation, progressive since 06/19/2007. Right middle lobe Volume loss and right lower lobe collapse/consolidative change, also favored to represent atelectasis  - 05/21/2015  Walked RA x 3 laps @ 185 ft each stopped due to  End of study, nl pace no sob or desat  - rec d/c 02 05/20/15  Most likely this was a combination of post op atx/ effects of narcotics/ obesity and previous scoliosis surgery x 3 but she has minimal residual restrictive changes on cxr and prognosis is quite good off 02.  There is a risk of recurrence with future surgeries and I would avoid using drugs like Macrodantin unless we did close serial f/u pfts/dlco's as she clearly has limited resp reserve.  Total face time = 84m review case with pt/husband /discussion/ counseling/ giving and going over instructions (see avs)

## 2015-05-21 NOTE — Telephone Encounter (Signed)
-----   Message from Tanda Rockers, MD sent at 05/21/2015  7:00 AM EDT ----- I missed the macrodantin the first time thru but do not recommend it at all in pts with tendency to resp failure even in a qod dose and she should call whoever gave it to her for suitable replacement

## 2015-05-21 NOTE — Assessment & Plan Note (Addendum)
Body mass index is 33.59 kg/(m^2).  No results found for: TSH     needs to achieve and maintain neg calorie balance >  rec  TSH update this year/ f/u primary care

## 2015-05-21 NOTE — Telephone Encounter (Signed)
Spoke with the pt and notified of recs per MW  She verbalized understanding  Nothing further needed 

## 2015-06-16 ENCOUNTER — Encounter: Payer: Self-pay | Admitting: Podiatry

## 2015-06-16 ENCOUNTER — Ambulatory Visit (INDEPENDENT_AMBULATORY_CARE_PROVIDER_SITE_OTHER): Payer: Medicare Other | Admitting: Podiatry

## 2015-06-16 DIAGNOSIS — B351 Tinea unguium: Secondary | ICD-10-CM

## 2015-06-16 DIAGNOSIS — L600 Ingrowing nail: Secondary | ICD-10-CM

## 2015-06-16 DIAGNOSIS — M79673 Pain in unspecified foot: Secondary | ICD-10-CM | POA: Diagnosis not present

## 2015-06-16 MED ORDER — NEOMYCIN-POLYMYXIN-HC 1 % OT SOLN: OTIC | Status: DC

## 2015-06-16 NOTE — Patient Instructions (Signed)

## 2015-06-16 NOTE — Progress Notes (Signed)
She presents today complaining of painful elongated toenails 1 through 5 bilateral. She's also complaining of a painfully ingrown toenail to the tibial border of the hallux left. She denies changes in her past medical history medications allergy surgeries and social history.  Objective: Vital signs are stable alert and oriented 3. Pulses are strongly palpable. Neurologic sensorium is intact. She has pain in limb secondary to thick yellow dystrophic onychomycotic nails 1 through 5 bilateral. Sharpened rated nail margins to the tibial border of the hallux left with gross granulation tissue purulence and malodor.  Assessment: Ingrown nail paronychia abscess hallux left. Pain in limb secondary onychomycosis 1 through 5 bilateral.  Plan: Debridement of nails 1 through 5 bilateral covered service secondary to pain. Performed and matrixectomy tibial border of the hallux left. She tolerated this procedure well after local anesthesia was achieved. She was provided with oral maneuver instructions for care and soaking of the wound to the left hallux. She is provided a prescription for Cortisporin Otic to be applied twice a day after soaking. I will follow-up with her in 1 week.

## 2015-06-23 ENCOUNTER — Encounter: Payer: Self-pay | Admitting: Podiatry

## 2015-06-23 ENCOUNTER — Ambulatory Visit (INDEPENDENT_AMBULATORY_CARE_PROVIDER_SITE_OTHER): Payer: Medicare Other | Admitting: Podiatry

## 2015-06-23 VITALS — BP 102/86 | HR 72 | Resp 12

## 2015-06-23 DIAGNOSIS — M2170 Unequal limb length (acquired), unspecified site: Secondary | ICD-10-CM

## 2015-06-23 DIAGNOSIS — L600 Ingrowing nail: Secondary | ICD-10-CM

## 2015-06-23 NOTE — Progress Notes (Signed)
She presents today for follow-up of matrixectomy hallux left. She denies fever chills nausea vomiting muscle aches and pains and states she continues to soak in Betadine warm water twice daily covering with a Band-Aid.  Objective: Vital signs are stable she is alert and oriented 3 pulses remain palpable to the left lower extremity. There is no erythema and no edema saline as drainage or odor hallux nail appears to be healing normally.  Assessment: 69 year old white female healing normally from matrixectomy tibial border hallux left.  Plan: Discontinue Betadine sterile with Epsom salts warm water soaks covered in today and leave open at night. Continue the use of Cortisporin Otic as prescribed. And I will follow-up with her as needed.

## 2015-08-08 ENCOUNTER — Ambulatory Visit (INDEPENDENT_AMBULATORY_CARE_PROVIDER_SITE_OTHER): Payer: Medicare Other | Admitting: Interventional Cardiology

## 2015-08-08 ENCOUNTER — Encounter: Payer: Self-pay | Admitting: Interventional Cardiology

## 2015-08-08 VITALS — BP 110/78 | HR 91 | Ht 60.0 in | Wt 174.8 lb

## 2015-08-08 DIAGNOSIS — R0902 Hypoxemia: Secondary | ICD-10-CM

## 2015-08-08 DIAGNOSIS — R0602 Shortness of breath: Secondary | ICD-10-CM

## 2015-08-08 DIAGNOSIS — I471 Supraventricular tachycardia: Secondary | ICD-10-CM | POA: Diagnosis not present

## 2015-08-08 DIAGNOSIS — R079 Chest pain, unspecified: Secondary | ICD-10-CM

## 2015-08-08 DIAGNOSIS — R000 Tachycardia, unspecified: Secondary | ICD-10-CM

## 2015-08-08 NOTE — Patient Instructions (Signed)
Medication Instructions:  STOP Metoprolol  Labwork: None ordered  Testing/Procedures: None ordered  Follow-Up: Your physician recommends that you schedule a follow-up appointment as needed   Any Other Special Instructions Will Be Listed Below (If Applicable).

## 2015-08-08 NOTE — Progress Notes (Signed)
Cardiology Office Note   Date:  08/08/2015   ID:  [name], DOB 1946-08-08, MRN [id number]  PCP:  Shirline Frees, MD  Cardiologist:  Sinclair Grooms, MD   Chief Complaint  Patient presents with  . Tachycardia  . Chest Pain      History of Present Illness: [name] is a 69 y.o. female who presents for with prior history of chest discomfort with negative ischemic workup (2015), dyspnea, hypertension, hyperlipidemia, and more recently hypoxia after a hip replacement operation.  She was seen in the hospital and on return visit following hospitalization for chest pain. It was felt to be musculoskeletal. Cardiac markers were unremarkable. Because of sinus tachycardia, metoprolol tartrate 25 mg twice a day was started. When seen in the office she continued to have tachycardia and the dose of metoprolol was adjusted by Rosaria Ferries, NP. The patient is now become filled enough that chronic oxygen therapy is been discontinued. She has decreased her metoprolol down to 12-1/2 mg daily, and overall feels well. She has rare palpitations when she lies down. She has had no sustained tachycardia. She has never been diagnosed with an actual cardiac problem.    Past Medical History  Diagnosis Date  . Hypertension   . GERD (gastroesophageal reflux disease)   . Headache(784.0)     migraines  . Arthritis   . PONV (postoperative nausea and vomiting)   . Hyperlipidemia   . DJD (degenerative joint disease)   . Hx-TIA (transient ischemic attack)     X2  . History of skin cancer   . History of cancer chemotherapy 1996    ALSO RADIATION - BREAST CANCER  . Breast cancer   . Breast cancer   . Squamous cell carcinoma     Past Surgical History  Procedure Laterality Date  . Total hip arthroplasty Bilateral 1987, 1989, 1997    right  x 2  . Back fusion  2005, 2008, 2011    T4-S1  . Rotator cuff repair Right     x 2  . Breast lumpectomy Left   . Bunionectomy Bilateral   .  Hysteroscopy w/d&c N/A 08/15/2013    Procedure: DILATATION AND CURETTAGE /HYSTEROSCOPY WITH RESECTION;  Surgeon: Princess Bruins, MD;  Location: [facility];  Service: Gynecology;  Laterality: N/A;  . Shoulder arthroscopy Left   . Tonsillectomy      x 4, kept growing back  . Thumb arthroscopy Right     left, cyst removal  . Cholecystectomy    . Nm myoview ltd  12/13/2013    Low risk study, no areas of significant ischemia. Mild apical, distal anterolateral wall perfusion defect is mostly fixed and likely represents attenuation artifact. EF 58%.  . Total hip revision Left 04/22/2015    Procedure: LEFT HIP REVISION ACETABULAR COMPONENT;  Surgeon: Rod Can, MD;  Location: [facility];  Service: Orthopedics;  Laterality: Left;     Current Outpatient Prescriptions  Medication Sig Dispense Refill  . Ascorbic Acid (VITAMIN C) 100 MG tablet Take 500 mg by mouth every morning.     . Biotin 5000 MCG CAPS Take 1 capsule by mouth at bedtime.     . Calcium-Vitamin D-Vitamin K (VIACTIV PO) Take 1 tablet by mouth every morning.     . celecoxib (CELEBREX) 200 MG capsule Take 1 capsule by mouth daily.    . cycloSPORINE (RESTASIS) 0.05 % ophthalmic emulsion Place 1 drop into both eyes 2 (two) times daily.    Marland Kitchen  Lactobacillus (ACIDOPHILUS PO) Take 1 tablet by mouth every morning.     . loratadine (CLARITIN) 10 MG tablet Take 10 mg by mouth daily as needed for allergies.     Marland Kitchen losartan-hydrochlorothiazide (HYZAAR) 50-12.5 MG per tablet Take 0.5 tablets by mouth at bedtime.     . Multiple Vitamin (MULTIVITAMIN WITH MINERALS) TABS tablet Take 1 tablet by mouth every morning. Centrum Silver.    Marland Kitchen NEOMYCIN-POLYMYXIN-HYDROCORTISONE (CORTISPORIN) 1 % SOLN otic solution Apply 1-2 drops to toe BID after soaking 10 mL 1  . pantoprazole (PROTONIX) 40 MG tablet Take 40 mg by mouth daily.    Vladimir Faster Glycol-Propyl Glycol (SYSTANE OP) Apply 1 drop to eye 2 (two) times daily.    . simvastatin (ZOCOR) 40 MG tablet Take 1  tablet (40 mg total) by mouth every evening. 30 tablet 0   No current facility-administered medications for this visit.    Allergies:   Sulfa antibiotics; Hydrocodone; Tincture of benzoin; Demerol; Dexamethasone; and Oxycodone    Social History:  The patient  reports that she has never smoked. She has never used smokeless tobacco. She reports that she drinks alcohol. She reports that she does not use illicit drugs.   Family History:  The patient's family history includes Breast cancer in her maternal grandmother and mother; Cervical cancer in her paternal grandmother; Colon cancer in her father; Heart attack in her father, paternal grandfather, and paternal grandmother; Hyperlipidemia in her father; Hypertension in her father and sister; Stroke in her maternal grandfather.    ROS:  Please see the history of present illness.   Otherwise, review of systems are positive for difficulty with balance, joint swelling, back discomfort, muscle pain, depression, and shortness of breath..   All other systems are reviewed and negative.    PHYSICAL EXAM: VS:  BP 110/78 mmHg  Pulse 91  Ht 5' (1.524 m)  Wt 79.289 kg (174 lb 12.8 oz)  BMI 34.14 kg/m2  SpO2 94% , BMI Body mass index is 34.14 kg/(m^2). GEN: Well nourished, well developed, in no acute distress HEENT: normal Neck: no JVD, carotid bruits, or masses Cardiac: RRR.  There is no murmur, rub, or gallop. There is no edema. Respiratory:  clear to auscultation bilaterally, normal work of breathing. GI: soft, nontender, nondistended, + BS MS: no deformity or atrophy Skin: warm and dry, no rash Neuro:  Strength and sensation are intact Psych: euthymic mood, full affect   EKG:  EKG is not ordered today.   Recent Labs: 04/17/2015: ALT 15 04/23/2015: BUN 11; Creatinine, Ser 0.68; Potassium 3.8; Sodium 138 04/24/2015: Hemoglobin 10.8*; Platelets 163    Lipid Panel No results found for: CHOL, TRIG, HDL, CHOLHDL, VLDL, LDLCALC, LDLDIRECT     Wt Readings from Last 3 Encounters:  08/08/15 79.289 kg (174 lb 12.8 oz)  05/20/15 78.019 kg (172 lb)  05/08/15 78.2 kg (172 lb 6.4 oz)      Other studies Reviewed: Additional studies/ records that were reviewed today include: The most recent office records and the hospital stay including the discharge summary from June were reviewed.. The findings include cardiac function was normal by echo. She has had no significant recurrence of chest discomfort in the interval since last being seen by cardiology..    ASSESSMENT AND PLAN:  1. Shortness of breath Chronic and followed by pulmonary. Improved.  2. Sinus tachycardia Resolved and now essentially on no significant dose of metoprolol, tartrate (12.5 mg once a day).  3. Chest pain, moderate coronary artery risk  Chest discomfort that was of concern has not recurred. She feels it was musculoskeletal. She has had a remote cardiac workup with a negative Myoview about 2 years ago. In absence of symptoms, I feel no further ischemic evaluation is necessary.  4. Hypoxia Resolved as the patient is no longer using oxygen    Current medicines are reviewed at length with the patient today.  The patient has the following concerns regarding medicines: Metoprolol was discussed..  The following changes/actions have been instituted:    Discontinue metoprolol tartrate  Labs/ tests ordered today include:  No orders of the defined types were placed in this encounter.     Disposition:   FU with HS as needed  Signed, Sinclair Grooms, MD  08/08/2015 4:01 PM    [facility] [place], [place], [place]  [address] Phone: [phone]; Fax: [phone]

## 2015-09-03 ENCOUNTER — Other Ambulatory Visit: Payer: Self-pay | Admitting: Rehabilitation

## 2015-09-03 DIAGNOSIS — M5030 Other cervical disc degeneration, unspecified cervical region: Secondary | ICD-10-CM

## 2015-09-09 ENCOUNTER — Ambulatory Visit
Admission: RE | Admit: 2015-09-09 | Discharge: 2015-09-09 | Disposition: A | Discharge status: Home / Self Care | Payer: Medicare Other | Source: Ambulatory Visit | Attending: Rehabilitation | Admitting: Rehabilitation

## 2015-09-09 DIAGNOSIS — M5030 Other cervical disc degeneration, unspecified cervical region: Secondary | ICD-10-CM

## 2015-10-28 DIAGNOSIS — M5412 Radiculopathy, cervical region: Secondary | ICD-10-CM

## 2015-10-28 DIAGNOSIS — M4312 Spondylolisthesis, cervical region: Secondary | ICD-10-CM

## 2015-10-28 HISTORY — DX: Spondylolisthesis, cervical region: M43.12

## 2015-10-28 HISTORY — DX: Radiculopathy, cervical region: M54.12

## 2016-01-30 ENCOUNTER — Other Ambulatory Visit: Payer: Self-pay

## 2016-01-30 DIAGNOSIS — Z1231 Encounter for screening mammogram for malignant neoplasm of breast: Secondary | ICD-10-CM

## 2016-03-11 ENCOUNTER — Ambulatory Visit
Admission: RE | Admit: 2016-03-11 | Discharge: 2016-03-11 | Disposition: A | Discharge status: Home / Self Care | Payer: Medicare Other | Source: Ambulatory Visit

## 2016-03-11 ENCOUNTER — Other Ambulatory Visit: Payer: Self-pay | Admitting: Obstetrics & Gynecology

## 2016-03-11 DIAGNOSIS — Z1231 Encounter for screening mammogram for malignant neoplasm of breast: Secondary | ICD-10-CM

## 2016-03-11 DIAGNOSIS — N644 Mastodynia: Secondary | ICD-10-CM

## 2016-03-18 ENCOUNTER — Ambulatory Visit
Admission: RE | Admit: 2016-03-18 | Discharge: 2016-03-18 | Disposition: A | Discharge status: Home / Self Care | Payer: Medicare Other | Source: Ambulatory Visit | Attending: Obstetrics & Gynecology | Admitting: Obstetrics & Gynecology

## 2016-03-18 DIAGNOSIS — N644 Mastodynia: Secondary | ICD-10-CM

## 2016-04-28 ENCOUNTER — Other Ambulatory Visit: Payer: Self-pay | Admitting: Physician Assistant

## 2016-04-28 DIAGNOSIS — M8580 Other specified disorders of bone density and structure, unspecified site: Secondary | ICD-10-CM

## 2016-05-13 ENCOUNTER — Ambulatory Visit
Admission: RE | Admit: 2016-05-13 | Discharge: 2016-05-13 | Disposition: A | Discharge status: Home / Self Care | Payer: Medicare Other | Source: Ambulatory Visit | Attending: Physician Assistant | Admitting: Physician Assistant

## 2016-05-13 DIAGNOSIS — M8580 Other specified disorders of bone density and structure, unspecified site: Secondary | ICD-10-CM

## 2016-05-17 ENCOUNTER — Ambulatory Visit (INDEPENDENT_AMBULATORY_CARE_PROVIDER_SITE_OTHER): Payer: Medicare Other

## 2016-05-17 ENCOUNTER — Ambulatory Visit: Payer: Self-pay

## 2016-05-17 ENCOUNTER — Ambulatory Visit (INDEPENDENT_AMBULATORY_CARE_PROVIDER_SITE_OTHER): Payer: Medicare Other | Admitting: Podiatry

## 2016-05-17 ENCOUNTER — Encounter: Payer: Self-pay | Admitting: Podiatry

## 2016-05-17 VITALS — BP 124/73 | HR 85 | Resp 16

## 2016-05-17 DIAGNOSIS — M79671 Pain in right foot: Secondary | ICD-10-CM

## 2016-05-17 DIAGNOSIS — M25572 Pain in left ankle and joints of left foot: Secondary | ICD-10-CM

## 2016-05-17 DIAGNOSIS — M779 Enthesopathy, unspecified: Secondary | ICD-10-CM

## 2016-05-17 DIAGNOSIS — M25571 Pain in right ankle and joints of right foot: Secondary | ICD-10-CM

## 2016-05-17 MED ORDER — TRIAMCINOLONE ACETONIDE 10 MG/ML IJ SUSP
10.0000 mg | Freq: Once | INTRAMUSCULAR | Status: AC
Start: 2016-05-17 — End: 2016-05-17
  Administered 2016-05-17: 10 mg

## 2016-05-18 NOTE — Progress Notes (Signed)
Subjective:     Patient ID: [name], female   DOB: 1946-04-15, 70 y.o.   MRN: [id number]  HPI patient has a lot of pain in the right medial foot with fluid buildup and no indication of tendon dysfunction   Review of Systems     Objective:   Physical Exam Neurovascular status intact muscle strength adequate with inflammation fluid around the extensor and the posterior tibial tendon complex with posterior tibial tendon being most involved and no indication of tear or dysfunction of the tendon    Assessment:     Inflammatory tendinitis right around the medial ankle    Plan:     H&P condition reviewed and careful sheath injection administered 3 mg [place] some Kenalog 5 mill grams Xylocaine and applied fascial brace to reduce pressure against the tendon

## 2016-05-27 ENCOUNTER — Telehealth: Payer: Self-pay | Admitting: *Deleted

## 2016-05-27 NOTE — Telephone Encounter (Signed)
Pt states she lost the bottom strap to the plantar fascial brace. I told pt the plantar fascial braces were $75.00 and we had no replacement parts.  Pt states she will continue to look.

## 2016-10-18 ENCOUNTER — Encounter: Payer: Self-pay | Admitting: Podiatry

## 2016-10-18 ENCOUNTER — Ambulatory Visit (INDEPENDENT_AMBULATORY_CARE_PROVIDER_SITE_OTHER): Payer: Medicare Other | Admitting: Podiatry

## 2016-10-18 DIAGNOSIS — B351 Tinea unguium: Secondary | ICD-10-CM

## 2016-10-18 DIAGNOSIS — M2042 Other hammer toe(s) (acquired), left foot: Secondary | ICD-10-CM

## 2016-10-18 DIAGNOSIS — M2041 Other hammer toe(s) (acquired), right foot: Secondary | ICD-10-CM

## 2016-10-18 DIAGNOSIS — Q828 Other specified congenital malformations of skin: Secondary | ICD-10-CM | POA: Diagnosis not present

## 2016-10-18 DIAGNOSIS — M79676 Pain in unspecified toe(s): Secondary | ICD-10-CM

## 2016-10-18 DIAGNOSIS — M2011 Hallux valgus (acquired), right foot: Secondary | ICD-10-CM

## 2016-10-18 NOTE — Progress Notes (Signed)
She presents today with a chief complaint of painful elongated toenails and calluses to the bilateral foot. She is also concerned about a hammertoe deformities and the hallux valgus deformities. States the right foot hurts worse than that of the left.  Objective: Vital signs are stable she is alert and oriented 3 pulses are palpable bilateral. Neurologic sensorium is intact. Degenerative flexor intact. Muscle strength is normal. I have reviewed her past history medications L surgeon social history. Possible hammertoe deformities and bunion deformities reducible in nature right foot. And that of the left most painful. No open lesions or wounds.  Assessment: Pain in limb secondary to hallux valgus deformity mallet toe deformity.  Plan: Debridement of all reactive hyperkeratoses and porokeratotic lesions also discussed in great detail today the need for surgical intervention consisting of an [facility] bunion repair second third and fourth metatarsal phalangeal joint releases and hammertoe repairs toes #23 #4 right. She would let her try a tennis shoe that she does not have to grip with her toes prior to any surgery.

## 2016-11-22 ENCOUNTER — Ambulatory Visit: Payer: Medicare Other | Admitting: Podiatry

## 2016-12-06 ENCOUNTER — Ambulatory Visit (INDEPENDENT_AMBULATORY_CARE_PROVIDER_SITE_OTHER): Payer: Medicare Other | Admitting: Podiatry

## 2016-12-06 ENCOUNTER — Encounter: Payer: Self-pay | Admitting: Podiatry

## 2016-12-06 DIAGNOSIS — M79676 Pain in unspecified toe(s): Secondary | ICD-10-CM

## 2016-12-06 DIAGNOSIS — B351 Tinea unguium: Secondary | ICD-10-CM | POA: Diagnosis not present

## 2016-12-06 DIAGNOSIS — Q828 Other specified congenital malformations of skin: Secondary | ICD-10-CM | POA: Diagnosis not present

## 2016-12-07 NOTE — Progress Notes (Signed)
She presents today to complaint of painful elongated toenails with corns and calluses plantar aspect of the bilateral foot 6 and she can hardly walk.  Objective: Vital signs are stable alert and oriented 3. Pulses are palpable. No erythema edema cellulitis drainage or odor. Nails are long thick yellow dystrophic with mycotic painful on palpation. Multiple porokeratosis plantar aspect of the bilateral foot not open to wounds.  Assessment: Limb secondary to onychomycosis and porokeratosis.  Plan: Debridement of toenails 1 through 5 bilateral debridement of all 4 keratotic lesions.

## 2016-12-16 ENCOUNTER — Ambulatory Visit (INDEPENDENT_AMBULATORY_CARE_PROVIDER_SITE_OTHER): Payer: Medicare Other | Admitting: Sports Medicine

## 2016-12-16 ENCOUNTER — Encounter: Payer: Self-pay | Admitting: Sports Medicine

## 2016-12-16 VITALS — BP 159/66 | HR 87 | Ht 60.0 in | Wt 168.0 lb

## 2016-12-16 DIAGNOSIS — M19029 Primary osteoarthritis, unspecified elbow: Secondary | ICD-10-CM | POA: Diagnosis not present

## 2016-12-16 DIAGNOSIS — M25511 Pain in right shoulder: Secondary | ICD-10-CM | POA: Diagnosis not present

## 2016-12-16 DIAGNOSIS — G8929 Other chronic pain: Secondary | ICD-10-CM | POA: Diagnosis not present

## 2016-12-16 MED ORDER — KETOROLAC TROMETHAMINE 60 MG/2ML IM SOLN
30.0000 mg | Freq: Once | INTRAMUSCULAR | Status: AC
Start: 2016-12-16 — End: 2016-12-16
  Administered 2016-12-16: 30 mg via INTRAMUSCULAR

## 2016-12-16 MED ORDER — METHYLPREDNISOLONE ACETATE 80 MG/ML IJ SUSP
80.0000 mg | Freq: Once | INTRAMUSCULAR | Status: AC
  Administered 2016-12-16: 80 mg via INTRAMUSCULAR

## 2016-12-17 NOTE — Progress Notes (Signed)
   Subjective:    Patient ID: [name], female    DOB: May 19, 1946, 71 y.o.   MRN: [id number]  HPI chief complaint: Right shoulder and bilateral elbow pain  Patient comes in today with several complaints. She is complaining of chronic pain in her neck and low back. She has had several spinal surgeries over the years. In fact, she has had a total of 15 orthopedic surgeries since 1980. Her main complaint today is right shoulder pain and bilateral elbow pain. She has been told in the past that she has arthritis in both of these areas. She currently takes gabapentin, Celebrex, and Tylenol 3 for her pain. These medications do seem to help. However, her symptoms have become a little more noticeable recently. She is here today basically for advice regarding additional treatment.  Past medical and surgical history reviewed. Surgical history is extensive and a complete list of surgeries is available for review on the chart. Medications reviewed Allergies reviewed    Review of Systems As above    Objective:   Physical Exam  Well-developed, well-nourished. No acute distress. Sitting comfortably in exam room  Patient has severely limited cervical and lumbar range of motion. Right shoulder demonstrates active forward flexion and abduction to 90. 4/5 strength with rotator cuff stressing. Examination of each elbow shows an extension lag of about 5 bilaterally. Trace effusion bilaterally. Good flexion. No tenderness to palpation. Neurovascularly intact distally.       Assessment & Plan:   Bilateral elbow pain secondary to osteoarthritis Right shoulder pain likely secondary to rotator cuff arthropathy  Unfortunately, I have little to offer this patient in the way of treatment. I do think it would be worthwhile trying an IM Depo-Medrol injection (80 mg) and an IM Toradol (30mg ) for her acute pain. She'll continue on her current medications and if she finds today's injections to be helpful  then we could repeat them again down the road. I would like to get x-rays of her right shoulder and both elbows and have her follow-up with me in the office in 4 weeks for reevaluation. I have strongly cautioned against further surgery if possible.

## 2016-12-24 ENCOUNTER — Ambulatory Visit
Admission: RE | Admit: 2016-12-24 | Discharge: 2016-12-24 | Disposition: A | Discharge status: Home / Self Care | Payer: Medicare Other | Source: Ambulatory Visit | Attending: Sports Medicine | Admitting: Sports Medicine

## 2016-12-24 DIAGNOSIS — M25511 Pain in right shoulder: Principal | ICD-10-CM

## 2016-12-24 DIAGNOSIS — M19029 Primary osteoarthritis, unspecified elbow: Secondary | ICD-10-CM

## 2016-12-24 DIAGNOSIS — G8929 Other chronic pain: Secondary | ICD-10-CM

## 2017-01-13 ENCOUNTER — Encounter: Payer: Self-pay | Admitting: Sports Medicine

## 2017-01-13 ENCOUNTER — Ambulatory Visit (INDEPENDENT_AMBULATORY_CARE_PROVIDER_SITE_OTHER): Payer: Medicare Other | Admitting: Sports Medicine

## 2017-01-13 VITALS — BP 98/70 | Ht 60.0 in | Wt 168.0 lb

## 2017-01-13 DIAGNOSIS — M19019 Primary osteoarthritis, unspecified shoulder: Secondary | ICD-10-CM | POA: Diagnosis not present

## 2017-01-13 DIAGNOSIS — M19029 Primary osteoarthritis, unspecified elbow: Secondary | ICD-10-CM

## 2017-01-13 NOTE — Progress Notes (Signed)
  Patient comes in today to discuss x-ray findings of both elbows and her right shoulder. Right elbow has extensive osteoarthritis as does the right shoulder. Left elbow has moderate osteoarthritis with a loose body in the anterior joint. She has had mechanical symptoms, specifically locking, of the left elbow in the past but today is doing pretty well. Her right elbow is constantly painful as is her right shoulder. She has seen Dr. Amedeo Plenty in the past for her elbows and he has advised against right elbow surgery. She is asking about the possibility of a right total shoulder replacement and I've advised her to discuss this further with Dr. Joni Fears at [facility]. She had a left shoulder hemiarthroplasty many years ago and has done well with that.  At this point we will leave things open-ended. She does think that the IM injections administered at her last office visit have been helpful. I explained to her that we can repeat that in a few months if needed. She will let me know if she needs my help with arranging for consultation with Dr. Joni Fears. Otherwise, she will continue on her current pain medication and follow-up with me as needed.  Total time spent with the patient was 15 minutes with greater than 50% of the time spent in face-to-face consultation discussing x-ray findings and future treatment.

## 2017-03-07 ENCOUNTER — Ambulatory Visit (INDEPENDENT_AMBULATORY_CARE_PROVIDER_SITE_OTHER): Payer: Medicare Other | Admitting: Podiatry

## 2017-03-07 ENCOUNTER — Encounter: Payer: Self-pay | Admitting: Podiatry

## 2017-03-07 DIAGNOSIS — Q828 Other specified congenital malformations of skin: Secondary | ICD-10-CM

## 2017-03-07 DIAGNOSIS — M79676 Pain in unspecified toe(s): Secondary | ICD-10-CM

## 2017-03-07 DIAGNOSIS — B351 Tinea unguium: Secondary | ICD-10-CM | POA: Diagnosis not present

## 2017-03-07 NOTE — Progress Notes (Signed)
She presents today chief complaint of painful elongated toenails and calluses.  Objective: Vital signs are stable alert and 3. Pulses are palpable. Neurologic services intact. Tenderness along thick yellow dystrophic with mycotic painful palpation.  Assessment: Pain limb secondary to onychomycosis porokeratosis bilateral.  Plan: Debridement toenails and for keratoma bilateral. Follow up with me on an as-needed basis.

## 2017-04-13 ENCOUNTER — Other Ambulatory Visit: Payer: Self-pay | Admitting: Obstetrics & Gynecology

## 2017-04-13 DIAGNOSIS — Z1231 Encounter for screening mammogram for malignant neoplasm of breast: Secondary | ICD-10-CM

## 2017-04-26 ENCOUNTER — Ambulatory Visit
Admission: RE | Admit: 2017-04-26 | Discharge: 2017-04-26 | Disposition: A | Discharge status: Home / Self Care | Payer: Medicare Other | Source: Ambulatory Visit | Attending: Obstetrics & Gynecology | Admitting: Obstetrics & Gynecology

## 2017-04-26 DIAGNOSIS — Z1231 Encounter for screening mammogram for malignant neoplasm of breast: Secondary | ICD-10-CM

## 2017-04-26 HISTORY — DX: Personal history of antineoplastic chemotherapy: Z92.21

## 2017-04-26 HISTORY — DX: Personal history of irradiation: Z92.3

## 2017-05-18 NOTE — H&P (Signed)
[name] is an 71 y.o. female.    Chief Complaint: right shoulder pain  HPI: Pt is a 71 y.o. female complaining of right shoulder pain for multiple years. Pain had continually increased since the beginning. X-rays in the clinic show end-stage arthritic changes of the right shoulder. Pt has tried various conservative treatments which have failed to alleviate their symptoms, including injections and therapy. Various options are discussed with the patient. Risks, benefits and expectations were discussed with the patient. Patient understand the risks, benefits and expectations and wishes to proceed with surgery.   PCP:  Shirline Frees, MD  D/C Plans: Home  PMH: Past Medical History:  Diagnosis Date  . Arthritis   . Breast cancer ([place])   . Breast cancer ([place])   . DJD (degenerative joint disease)   . GERD (gastroesophageal reflux disease)   . Headache(784.0)    migraines  . History of cancer chemotherapy 1996   ALSO RADIATION - BREAST CANCER  . History of skin cancer   . Hx-TIA (transient ischemic attack)    X2  . Hyperlipidemia   . Hypertension   . Personal history of chemotherapy 1997  . Personal history of radiation therapy 1996  . PONV (postoperative nausea and vomiting)   . Squamous cell carcinoma     PSH: Past Surgical History:  Procedure Laterality Date  . back fusion  2005, 2008, 2011   T4-S1  . BREAST BIOPSY Left 1996  . BREAST LUMPECTOMY Left 1996  . BUNIONECTOMY Bilateral   . CHOLECYSTECTOMY    . HYSTEROSCOPY W/D&C N/A 08/15/2013   Procedure: DILATATION AND CURETTAGE /HYSTEROSCOPY WITH RESECTION;  Surgeon: Princess Bruins, MD;  Location: [facility];  Service: Gynecology;  Laterality: N/A;  . NM MYOVIEW LTD  12/13/2013   Low risk study, no areas of significant ischemia. Mild apical, distal anterolateral wall perfusion defect is mostly fixed and likely represents attenuation artifact. EF 58%.  Marland Kitchen ROTATOR CUFF REPAIR Right    x 2  . SHOULDER ARTHROSCOPY Left     . THUMB ARTHROSCOPY Right    left, cyst removal  . TONSILLECTOMY     x 4, kept growing back  . TOTAL HIP ARTHROPLASTY Bilateral 1987, [place]   right  x 2  . TOTAL HIP REVISION Left 04/22/2015   Procedure: LEFT HIP REVISION ACETABULAR COMPONENT;  Surgeon: Rod Can, MD;  Location: [facility];  Service: Orthopedics;  Laterality: Left;    Social History:  reports that she has never smoked. She has never used smokeless tobacco. She reports that she drinks alcohol. She reports that she does not use drugs.  Allergies:  Allergies  Allergen Reactions  . Sulfa Antibiotics Hives and Rash  . Hydrocodone     Suppressed breathing  . Tincture Of Benzoin [Benzoin] Itching    Burned skin  . Demerol [Meperidine] Itching and Nausea Only    Depressed breathing and slurred speach  . Dexamethasone Other (See Comments)    Pins and needles, hot, flushed  . Oxycodone Itching and Nausea And Vomiting    Medications: No current facility-administered medications for this encounter.    Current Outpatient Prescriptions  Medication Sig Dispense Refill  . Ascorbic Acid (VITAMIN C) 100 MG tablet Take 500 mg by mouth every morning.     . Biotin 5000 MCG CAPS Take 1 capsule by mouth at bedtime.     . Calcium-Vitamin D-Vitamin K (VIACTIV PO) Take 1 tablet by mouth every morning.     . celecoxib (CELEBREX) 200  MG capsule Take 1 capsule by mouth daily.    . cycloSPORINE (RESTASIS) 0.05 % ophthalmic emulsion Place 1 drop into both eyes 2 (two) times daily.    Marland Kitchen gabapentin (NEURONTIN) 300 MG capsule     . Lactobacillus (ACIDOPHILUS PO) Take 1 tablet by mouth every morning.     . loratadine (CLARITIN) 10 MG tablet Take 10 mg by mouth daily as needed for allergies.     Marland Kitchen losartan-hydrochlorothiazide (HYZAAR) 50-12.5 MG per tablet Take 0.5 tablets by mouth at bedtime.     . Multiple Vitamin (MULTIVITAMIN WITH MINERALS) TABS tablet Take 1 tablet by mouth every morning. Centrum Silver.    . pantoprazole  (PROTONIX) 40 MG tablet Take 40 mg by mouth daily.    Vladimir Faster Glycol-Propyl Glycol (SYSTANE OP) Apply 1 drop to eye 2 (two) times daily.    . simvastatin (ZOCOR) 40 MG tablet Take 1 tablet (40 mg total) by mouth every evening. 30 tablet 0    No results found for this or any previous visit (from the past 48 hour(s)). No results found.  ROS: Pain with rom of the right upper extremity  Physical Exam:  Alert and oriented 71 y.o. female in no acute distress Cranial nerves 2-12 intact Cervical spine: full rom with no tenderness, nv intact distally Chest: active breath sounds bilaterally, no wheeze rhonchi or rales Heart: regular rate and rhythm, no murmur Abd: non tender non distended with active bowel sounds Hip is stable with rom  Right shoulder with limited rom and strength  nv intact distally No rashes or edema  Assessment/Plan Assessment: right shoulder cuff arthropathy  Plan: Patient will undergo a right reverse total shoulder by Dr. Veverly Fells at [facility]. Risks benefits and expectations were discussed with the patient. Patient understand risks, benefits and expectations and wishes to proceed.

## 2017-05-24 ENCOUNTER — Encounter (HOSPITAL_COMMUNITY)
Admission: RE | Admit: 2017-05-24 | Discharge: 2017-05-24 | Disposition: A | Discharge status: Home / Self Care | Payer: Medicare Other | Source: Ambulatory Visit | Attending: Orthopedic Surgery | Admitting: Orthopedic Surgery

## 2017-05-24 ENCOUNTER — Encounter (HOSPITAL_COMMUNITY): Payer: Self-pay

## 2017-05-24 HISTORY — DX: Primary osteoarthritis, right shoulder: M19.011

## 2017-05-24 HISTORY — DX: Family history of other specified conditions: Z84.89

## 2017-05-24 HISTORY — DX: Presence of spectacles and contact lenses: Z97.3

## 2017-05-24 LAB — CBC
HEMATOCRIT: 43.9 % (ref 36.0–46.0)
Hemoglobin: 14.5 g/dL (ref 12.0–15.0)
MCH: 29.8 pg (ref 26.0–34.0)
MCHC: 33 g/dL (ref 30.0–36.0)
MCV: 90.1 fL (ref 78.0–100.0)
PLATELETS: 220 10*3/uL (ref 150–400)
RBC: 4.87 MIL/uL (ref 3.87–5.11)
RDW: 12.8 % (ref 11.5–15.5)
WBC: 5.9 10*3/uL (ref 4.0–10.5)

## 2017-05-24 LAB — BASIC METABOLIC PANEL
Anion gap: 7 (ref 5–15)
BUN: 16 mg/dL (ref 6–20)
CALCIUM: 9.8 mg/dL (ref 8.9–10.3)
CO2: 28 mmol/L (ref 22–32)
CREATININE: 0.65 mg/dL (ref 0.44–1.00)
Chloride: 103 mmol/L (ref 101–111)
GFR calc Af Amer: >60 mL/min (ref >60)
GLUCOSE: 100 mg/dL — AB (ref 65–99)
POTASSIUM: 3.7 mmol/L (ref 3.5–5.1)
SODIUM: 138 mmol/L (ref 135–145)

## 2017-05-24 LAB — SURGICAL PCR SCREEN
MRSA, PCR: NEGATIVE
STAPHYLOCOCCUS AUREUS: NEGATIVE

## 2017-05-24 NOTE — Progress Notes (Signed)
Requested EKG tracing from PCP; Dr. Mickel Crow of [place] at [facility].

## 2017-05-24 NOTE — Progress Notes (Signed)
Pt denies SOB, chest pain, and being under the care of a cardiologist. Pt stated that Dr. Daneen Schick was her cardiologist in the past but she has not seen him in " about 3 years." Pt denies having a cardiac cath.

## 2017-05-24 NOTE — Pre-Procedure Instructions (Signed)
    [name]  05/24/2017      [facility] - [place], [place] - [address] [place] [address] Phone: [phone] Fax: [phone]    Your procedure is scheduled on Friday, May 27, 2017  Report to [facility] Admitting at 8:30 A.M.  Call this number if you have problems the morning of surgery:  [phone]   Remember:  Do not eat food or drink liquids after midnight Thursday, May 26, 2017  Take these medicines the morning of surgery with A SIP OF WATER : pantoprazole (PROTONIX), gabapentin (NEURONTIN), cycloSPORINE (RESTASIS), if needed: acetaminophen (TYLENOL) for pain Stop taking Aspirin, vitamins, fish oil, Biotin and herbal medications. Do not take any NSAIDs ie: Ibuprofen, Advil, Naproxen, (Aleve), Motrin, BC and Goody Powder or any medication containing Aspirin such as celecoxib (CELEBREX); stop now.  Do not wear jewelry, make-up or nail polish.  Do not wear lotions, powders, or perfumes, or deoderant.  Do not shave 48 hours prior to surgery.    Do not bring valuables to the hospital.  [facility] is not responsible for any belongings or valuables. Contacts, dentures or bridgework may not be worn into surgery.  Leave your suitcase in the car.  After surgery it may be brought to your room. For patients admitted to the hospital, discharge time will be determined by your treatment team. Special instructions: Shower the night before surgery and the morning of surgery with [facility]. Please read over the following fact sheets that you were given. Pain Booklet, Coughing and Deep Breathing and Surgical Site Infection Prevention

## 2017-05-25 NOTE — Progress Notes (Signed)
Anesthesia Chart Review: Patient is a 71 year old female scheduled for right reverse total shoulder arthroplasty on 05/27/2017 by Dr. Veverly Fells.  History includes never smoker, post-operative N/V, HTN, GERD, migraines, HLD, breast cancer s/p left lumpectomy/radiation '96, skin cancer ([facility]), back surgery (L1 laminectomy '02, T4-L4 fusion '05 with exploration L4-S1 fusion '08), tonsillectomy, cholecystectomy '03, left total shoulder '04, left THA (s/p revision '16).  - PCP is Dr. Blythe Stanford.  - Primary cardiologist is Dr. Daneen Schick, last visit 08/08/15 for chest pain follow-up that was thought to be musculoskeletal. Prior work-up unremarkable. No further ischemic evaluation and PRN cardiology follow-up recommended.   Meds include Tylenol #3, Neurontin, Lidoderm, Hyzaar, Protonix, Zocor.  BP 109/64   Pulse 96   Temp 36.9 C   Resp 20   Ht 5' (1.524 m)   Wt 153 lb 6.4 oz (69.6 kg)   SpO2 99%   BMI 29.96 kg/m    EKG requested from PCP office because she thought one had been done there within the last year, but last one received was from 1/116/15. Her last one in [facility] was from 08/08/15.   Echo 04/23/15: Study Conclusions - Left ventricle: The cavity size was normal. Wall thickness was   increased in a pattern of mild LVH. Systolic function was normal.   The estimated ejection fraction was in the range of 55% to 60%.   Wall motion was normal; there were no regional wall motion   abnormalities. Doppler parameters are consistent with abnormal   left ventricular relaxation (grade 1 diastolic dysfunction). The   E/e&' ratio is <8, suggesting normal LV filling pressure. - Left atrium: Moderately dilated at 46 ml/m2. - Inferior vena cava: IVC measures <2.1 cm, but does not collapse   more than 50%, suggesting an elevated RA pressure of 8 mmHg. Impressions: - LVEF 55-60%, mild LVH, diastolic dysfunction with normal LV   filling pressure, moderate LAE, mildly elevated RA pressure.  Nuclear  stress test 12/13/13: Overall Impression:  Low risk stress nuclear study with no areas of significant ischemia. Mild apical, distal anterolateral wall perfusion defect is mostly fixed and likely represents attenuation artifact. . LV Ejection Fraction: 58%.  LV Wall Motion:  NL LV Function; NL Wall Motion.  Preoperative labs noted.   She will need an updated EKG prior to surgery (since last once > 1 year ago). She denied chest pain and SOB at PAT. If EKG stable and no acute changes then I would anticipate that she can proceed as planned.  George Hugh [facility] Short Stay Center/Anesthesiology Phone [phone] 05/25/2017 12:47 PM

## 2017-05-27 ENCOUNTER — Inpatient Hospital Stay (HOSPITAL_COMMUNITY): Payer: Medicare Other | Admitting: Vascular Surgery

## 2017-05-27 ENCOUNTER — Inpatient Hospital Stay (HOSPITAL_COMMUNITY): Payer: Medicare Other | Admitting: Certified Registered Nurse Anesthetist

## 2017-05-27 ENCOUNTER — Encounter (HOSPITAL_COMMUNITY): Payer: Self-pay | Admitting: Certified Registered Nurse Anesthetist

## 2017-05-27 ENCOUNTER — Inpatient Hospital Stay (HOSPITAL_COMMUNITY): Payer: Medicare Other

## 2017-05-27 ENCOUNTER — Inpatient Hospital Stay (HOSPITAL_COMMUNITY)
Admission: RE | Admit: 2017-05-27 | Discharge: 2017-05-29 | DRG: 483 | Disposition: A | Discharge status: Home / Self Care | Payer: Medicare Other | Source: Ambulatory Visit | Attending: Orthopedic Surgery | Admitting: Orthopedic Surgery

## 2017-05-27 ENCOUNTER — Encounter (HOSPITAL_COMMUNITY)
Admission: RE | Disposition: A | Discharge status: Home / Self Care | Payer: Self-pay | Source: Ambulatory Visit | Attending: Orthopedic Surgery

## 2017-05-27 DIAGNOSIS — Z9221 Personal history of antineoplastic chemotherapy: Secondary | ICD-10-CM | POA: Diagnosis not present

## 2017-05-27 DIAGNOSIS — Z96643 Presence of artificial hip joint, bilateral: Secondary | ICD-10-CM | POA: Diagnosis present

## 2017-05-27 DIAGNOSIS — Z85828 Personal history of other malignant neoplasm of skin: Secondary | ICD-10-CM | POA: Diagnosis not present

## 2017-05-27 DIAGNOSIS — I959 Hypotension, unspecified: Secondary | ICD-10-CM | POA: Diagnosis not present

## 2017-05-27 DIAGNOSIS — Z923 Personal history of irradiation: Secondary | ICD-10-CM | POA: Diagnosis not present

## 2017-05-27 DIAGNOSIS — Z885 Allergy status to narcotic agent status: Secondary | ICD-10-CM | POA: Diagnosis not present

## 2017-05-27 DIAGNOSIS — Z888 Allergy status to other drugs, medicaments and biological substances status: Secondary | ICD-10-CM | POA: Diagnosis not present

## 2017-05-27 DIAGNOSIS — Z882 Allergy status to sulfonamides status: Secondary | ICD-10-CM | POA: Diagnosis not present

## 2017-05-27 DIAGNOSIS — E876 Hypokalemia: Secondary | ICD-10-CM | POA: Diagnosis not present

## 2017-05-27 DIAGNOSIS — M19011 Primary osteoarthritis, right shoulder: Principal | ICD-10-CM | POA: Diagnosis present

## 2017-05-27 DIAGNOSIS — I10 Essential (primary) hypertension: Secondary | ICD-10-CM | POA: Diagnosis present

## 2017-05-27 DIAGNOSIS — Z853 Personal history of malignant neoplasm of breast: Secondary | ICD-10-CM | POA: Diagnosis not present

## 2017-05-27 DIAGNOSIS — Z96611 Presence of right artificial shoulder joint: Secondary | ICD-10-CM

## 2017-05-27 DIAGNOSIS — Z79899 Other long term (current) drug therapy: Secondary | ICD-10-CM

## 2017-05-27 DIAGNOSIS — Z981 Arthrodesis status: Secondary | ICD-10-CM

## 2017-05-27 DIAGNOSIS — Z8673 Personal history of transient ischemic attack (TIA), and cerebral infarction without residual deficits: Secondary | ICD-10-CM

## 2017-05-27 HISTORY — DX: Presence of right artificial shoulder joint: Z96.611

## 2017-05-27 HISTORY — PX: REVERSE SHOULDER ARTHROPLASTY: SHX5054

## 2017-05-27 SURGERY — ARTHROPLASTY, SHOULDER, TOTAL, REVERSE
Anesthesia: General | Site: Shoulder | Laterality: Right

## 2017-05-27 MED ORDER — ONDANSETRON HCL 4 MG/2ML IJ SOLN
INTRAMUSCULAR | Status: DC | PRN
  Administered 2017-05-27: 4 mg via INTRAVENOUS

## 2017-05-27 MED ORDER — PANTOPRAZOLE SODIUM 40 MG PO TBEC
40.0000 mg | DELAYED_RELEASE_TABLET | Freq: Every day | ORAL | Status: DC
  Administered 2017-05-28 – 2017-05-29 (×2): 40 mg via ORAL
  Filled 2017-05-27 (×2): qty 1

## 2017-05-27 MED ORDER — CELECOXIB 200 MG PO CAPS: 200.0000 mg | ORAL_CAPSULE | Freq: Every day | ORAL | Status: DC

## 2017-05-27 MED ORDER — BIOTIN 10000 MCG PO TABS: 1.0000 | ORAL_TABLET | Freq: Every day | ORAL | Status: DC

## 2017-05-27 MED ORDER — METHOCARBAMOL 500 MG PO TABS: 500.0000 mg | ORAL_TABLET | Freq: Three times a day (TID) | ORAL | 1 refills | Status: DC | PRN

## 2017-05-27 MED ORDER — ROCURONIUM BROMIDE 100 MG/10ML IV SOLN
INTRAVENOUS | Status: DC | PRN
  Administered 2017-05-27: 40 mg via INTRAVENOUS

## 2017-05-27 MED ORDER — CEFAZOLIN SODIUM-DEXTROSE 2-4 GM/100ML-% IV SOLN
2.0000 g | INTRAVENOUS | Status: AC
  Administered 2017-05-27: 2 g via INTRAVENOUS

## 2017-05-27 MED ORDER — DEXTROSE 5 % IV SOLN: 500.0000 mg | Freq: Four times a day (QID) | INTRAVENOUS | Status: DC | PRN

## 2017-05-27 MED ORDER — BUPIVACAINE-EPINEPHRINE (PF) 0.25% -1:200000 IJ SOLN
INTRAMUSCULAR | Status: AC
Start: 2017-05-27 — End: ?
  Filled 2017-05-27: qty 30

## 2017-05-27 MED ORDER — CEFAZOLIN SODIUM-DEXTROSE 2-4 GM/100ML-% IV SOLN
2.0000 g | Freq: Four times a day (QID) | INTRAVENOUS | Status: AC
  Administered 2017-05-27 – 2017-05-28 (×2): 2 g via INTRAVENOUS
  Filled 2017-05-27 (×3): qty 100

## 2017-05-27 MED ORDER — GABAPENTIN 300 MG PO CAPS
300.0000 mg | ORAL_CAPSULE | Freq: Two times a day (BID) | ORAL | Status: DC
  Administered 2017-05-27 – 2017-05-29 (×4): 300 mg via ORAL
  Filled 2017-05-27 (×4): qty 1

## 2017-05-27 MED ORDER — SIMVASTATIN 40 MG PO TABS
40.0000 mg | ORAL_TABLET | Freq: Every evening | ORAL | Status: DC
  Administered 2017-05-27 – 2017-05-29 (×3): 40 mg via ORAL
  Filled 2017-05-27 (×3): qty 1

## 2017-05-27 MED ORDER — ONDANSETRON HCL 4 MG/2ML IJ SOLN: 4.0000 mg | Freq: Four times a day (QID) | INTRAMUSCULAR | Status: DC | PRN

## 2017-05-27 MED ORDER — HYDROMORPHONE HCL 2 MG PO TABS: 1.0000 mg | ORAL_TABLET | Freq: Four times a day (QID) | ORAL | 0 refills | Status: DC | PRN

## 2017-05-27 MED ORDER — CHLORHEXIDINE GLUCONATE 4 % EX LIQD: 60.0000 mL | Freq: Once | CUTANEOUS | Status: DC

## 2017-05-27 MED ORDER — PHENYLEPHRINE 40 MCG/ML (10ML) SYRINGE FOR IV PUSH (FOR BLOOD PRESSURE SUPPORT)
PREFILLED_SYRINGE | INTRAVENOUS | Status: DC | PRN
  Administered 2017-05-27: 80 ug via INTRAVENOUS
  Administered 2017-05-27: 15 ug via INTRAVENOUS
  Administered 2017-05-27: 120 ug via INTRAVENOUS

## 2017-05-27 MED ORDER — HYDROMORPHONE HCL 1 MG/ML IJ SOLN
0.5000 mg | INTRAMUSCULAR | Status: DC | PRN
  Administered 2017-05-27 – 2017-05-28 (×3): 0.5 mg via INTRAVENOUS
  Filled 2017-05-27 (×3): qty 1

## 2017-05-27 MED ORDER — SODIUM CHLORIDE 0.9 % IV SOLN
INTRAVENOUS | Status: DC
  Administered 2017-05-27: 16:00:00 via INTRAVENOUS

## 2017-05-27 MED ORDER — HYDROCHLOROTHIAZIDE 12.5 MG PO CAPS
12.5000 mg | ORAL_CAPSULE | Freq: Every day | ORAL | Status: DC
  Administered 2017-05-27: 12.5 mg via ORAL
  Filled 2017-05-27: qty 1

## 2017-05-27 MED ORDER — LIDOCAINE HCL (CARDIAC) 20 MG/ML IV SOLN
INTRAVENOUS | Status: DC | PRN
  Administered 2017-05-27: 40 mg via INTRAVENOUS

## 2017-05-27 MED ORDER — METOCLOPRAMIDE HCL 5 MG PO TABS
5.0000 mg | ORAL_TABLET | Freq: Three times a day (TID) | ORAL | Status: DC | PRN
  Administered 2017-05-28: 10 mg via ORAL
  Filled 2017-05-27: qty 2

## 2017-05-27 MED ORDER — METHOCARBAMOL 500 MG PO TABS
500.0000 mg | ORAL_TABLET | Freq: Four times a day (QID) | ORAL | Status: DC | PRN
  Administered 2017-05-27 – 2017-05-29 (×5): 500 mg via ORAL
  Filled 2017-05-27 (×5): qty 1

## 2017-05-27 MED ORDER — ACETAMINOPHEN-CODEINE #3 300-30 MG PO TABS
1.0000 | ORAL_TABLET | Freq: Every day | ORAL | Status: DC | PRN
  Administered 2017-05-27 – 2017-05-28 (×2): 1 via ORAL
  Filled 2017-05-27 (×2): qty 1

## 2017-05-27 MED ORDER — DEXAMETHASONE SODIUM PHOSPHATE 10 MG/ML IJ SOLN
INTRAMUSCULAR | Status: AC
  Filled 2017-05-27: qty 1

## 2017-05-27 MED ORDER — HYDROMORPHONE HCL 2 MG PO TABS
1.0000 mg | ORAL_TABLET | Freq: Four times a day (QID) | ORAL | Status: DC | PRN
  Administered 2017-05-28 – 2017-05-29 (×5): 2 mg via ORAL
  Filled 2017-05-27 (×6): qty 1

## 2017-05-27 MED ORDER — ACETAMINOPHEN 325 MG PO TABS
650.0000 mg | ORAL_TABLET | Freq: Four times a day (QID) | ORAL | Status: DC | PRN
  Filled 2017-05-27: qty 2

## 2017-05-27 MED ORDER — PHENYLEPHRINE 40 MCG/ML (10ML) SYRINGE FOR IV PUSH (FOR BLOOD PRESSURE SUPPORT)
PREFILLED_SYRINGE | INTRAVENOUS | Status: AC
  Filled 2017-05-27: qty 10

## 2017-05-27 MED ORDER — SUGAMMADEX SODIUM 200 MG/2ML IV SOLN
INTRAVENOUS | Status: DC | PRN
  Administered 2017-05-27: 140 mg via INTRAVENOUS

## 2017-05-27 MED ORDER — MENTHOL 3 MG MT LOZG: 1.0000 | LOZENGE | OROMUCOSAL | Status: DC | PRN

## 2017-05-27 MED ORDER — ONDANSETRON HCL 4 MG PO TABS: 4.0000 mg | ORAL_TABLET | Freq: Four times a day (QID) | ORAL | Status: DC | PRN

## 2017-05-27 MED ORDER — MIDAZOLAM HCL 2 MG/2ML IJ SOLN
INTRAMUSCULAR | Status: AC
  Administered 2017-05-27: 1 mg
  Filled 2017-05-27: qty 2

## 2017-05-27 MED ORDER — PROPOFOL 10 MG/ML IV BOLUS
INTRAVENOUS | Status: DC | PRN
  Administered 2017-05-27: 130 mg via INTRAVENOUS

## 2017-05-27 MED ORDER — PHENOL 1.4 % MT LIQD: 1.0000 | OROMUCOSAL | Status: DC | PRN

## 2017-05-27 MED ORDER — LACTATED RINGERS IV SOLN
INTRAVENOUS | Status: DC
  Administered 2017-05-27: 50 mL/h via INTRAVENOUS

## 2017-05-27 MED ORDER — VITAMIN C 500 MG PO TABS: 500.0000 mg | ORAL_TABLET | Freq: Every day | ORAL | Status: DC

## 2017-05-27 MED ORDER — FENTANYL CITRATE (PF) 250 MCG/5ML IJ SOLN
INTRAMUSCULAR | Status: AC
  Filled 2017-05-27: qty 5

## 2017-05-27 MED ORDER — BUPIVACAINE-EPINEPHRINE 0.25% -1:200000 IJ SOLN
INTRAMUSCULAR | Status: DC | PRN
  Administered 2017-05-27: 9 mL

## 2017-05-27 MED ORDER — ONDANSETRON HCL 4 MG/2ML IJ SOLN
INTRAMUSCULAR | Status: AC
  Filled 2017-05-27: qty 2

## 2017-05-27 MED ORDER — DOCUSATE SODIUM 100 MG PO CAPS
100.0000 mg | ORAL_CAPSULE | Freq: Two times a day (BID) | ORAL | Status: DC
  Administered 2017-05-27 – 2017-05-29 (×4): 100 mg via ORAL
  Filled 2017-05-27 (×4): qty 1

## 2017-05-27 MED ORDER — POLYETHYLENE GLYCOL 3350 17 G PO PACK
17.0000 g | PACK | Freq: Every day | ORAL | Status: DC | PRN
Start: 2017-05-27 — End: 2017-05-29
  Administered 2017-05-28: 17 g via ORAL
  Filled 2017-05-27: qty 1

## 2017-05-27 MED ORDER — LOSARTAN POTASSIUM-HCTZ 50-12.5 MG PO TABS: 1.0000 | ORAL_TABLET | Freq: Every day | ORAL | Status: DC

## 2017-05-27 MED ORDER — CYCLOSPORINE 0.05 % OP EMUL
1.0000 [drp] | Freq: Two times a day (BID) | OPHTHALMIC | Status: DC
  Administered 2017-05-27 – 2017-05-29 (×4): 1 [drp] via OPHTHALMIC
  Filled 2017-05-27 (×5): qty 1

## 2017-05-27 MED ORDER — ROCURONIUM BROMIDE 50 MG/5ML IV SOLN
INTRAVENOUS | Status: AC
Start: 2017-05-27 — End: ?
  Filled 2017-05-27: qty 1

## 2017-05-27 MED ORDER — FENTANYL CITRATE (PF) 100 MCG/2ML IJ SOLN
INTRAMUSCULAR | Status: DC | PRN
  Administered 2017-05-27: 50 ug via INTRAVENOUS

## 2017-05-27 MED ORDER — CEFAZOLIN SODIUM-DEXTROSE 2-4 GM/100ML-% IV SOLN
INTRAVENOUS | Status: AC
  Filled 2017-05-27: qty 100

## 2017-05-27 MED ORDER — POLYETHYL GLYCOL-PROPYL GLYCOL 0.4-0.3 % OP GEL: Freq: Every day | OPHTHALMIC | Status: DC

## 2017-05-27 MED ORDER — METOCLOPRAMIDE HCL 5 MG/ML IJ SOLN: 5.0000 mg | Freq: Three times a day (TID) | INTRAMUSCULAR | Status: DC | PRN

## 2017-05-27 MED ORDER — SODIUM CHLORIDE 0.9 % IR SOLN
Status: DC | PRN
  Administered 2017-05-27 (×2): 1000 mL via INTRAVESICAL

## 2017-05-27 MED ORDER — FENTANYL CITRATE (PF) 100 MCG/2ML IJ SOLN
INTRAMUSCULAR | Status: AC
  Administered 2017-05-27: 50 ug via INTRAVENOUS
  Filled 2017-05-27: qty 2

## 2017-05-27 MED ORDER — LOSARTAN POTASSIUM 50 MG PO TABS
50.0000 mg | ORAL_TABLET | Freq: Every day | ORAL | Status: DC
  Administered 2017-05-27 – 2017-05-28 (×2): 50 mg via ORAL
  Filled 2017-05-27 (×2): qty 1

## 2017-05-27 MED ORDER — FENTANYL CITRATE (PF) 100 MCG/2ML IJ SOLN
25.0000 ug | INTRAMUSCULAR | Status: DC | PRN
  Administered 2017-05-27 (×3): 50 ug via INTRAVENOUS

## 2017-05-27 MED ORDER — ACETAMINOPHEN 650 MG RE SUPP: 650.0000 mg | Freq: Four times a day (QID) | RECTAL | Status: DC | PRN

## 2017-05-27 MED ORDER — BISACODYL 10 MG RE SUPP: 10.0000 mg | Freq: Every day | RECTAL | Status: DC | PRN

## 2017-05-27 MED ORDER — PHENYLEPHRINE HCL 10 MG/ML IJ SOLN
INTRAVENOUS | Status: DC | PRN
  Administered 2017-05-27: 40 ug/min via INTRAVENOUS

## 2017-05-27 MED ORDER — BUPIVACAINE-EPINEPHRINE (PF) 0.5% -1:200000 IJ SOLN
INTRAMUSCULAR | Status: DC | PRN
  Administered 2017-05-27: 30 mL via PERINEURAL

## 2017-05-27 MED ORDER — DEXAMETHASONE SODIUM PHOSPHATE 10 MG/ML IJ SOLN
INTRAMUSCULAR | Status: DC | PRN
  Administered 2017-05-27: 10 mg via INTRAVENOUS

## 2017-05-27 MED ORDER — ONDANSETRON HCL 4 MG/2ML IJ SOLN
4.0000 mg | Freq: Four times a day (QID) | INTRAMUSCULAR | Status: DC | PRN
  Administered 2017-05-28: 4 mg via INTRAVENOUS
  Filled 2017-05-27: qty 2

## 2017-05-27 MED ORDER — ACETAMINOPHEN 500 MG PO TABS: 1000.0000 mg | ORAL_TABLET | Freq: Three times a day (TID) | ORAL | Status: DC | PRN

## 2017-05-27 SURGICAL SUPPLY — 68 items
BIT DRILL 170X2.5X (BIT) IMPLANT
BIT DRILL 5/64X5 DISP (BIT) ×3 IMPLANT
BIT DRL 170X2.5X (BIT)
BLADE SAG 18X100X1.27 (BLADE) ×3 IMPLANT
CAPT SHLDR REVTOTAL 2 ×2 IMPLANT
CLOSURE WOUND 1/2 X4 (GAUZE/BANDAGES/DRESSINGS) ×1
COVER SURGICAL LIGHT HANDLE (MISCELLANEOUS) ×3 IMPLANT
DRAPE IMP U-DRAPE 54X76 (DRAPES) ×6 IMPLANT
DRAPE INCISE IOBAN 66X45 STRL (DRAPES) ×1 IMPLANT
DRAPE INCISE IOBAN 85X60 (DRAPES) ×2 IMPLANT
DRAPE ORTHO SPLIT 77X108 STRL (DRAPES) ×6
DRAPE SURG ORHT 6 SPLT 77X108 (DRAPES) ×2 IMPLANT
DRAPE U-SHAPE 47X51 STRL (DRAPES) ×3 IMPLANT
DRILL 2.5 (BIT)
DRSG ADAPTIC 3X8 NADH LF (GAUZE/BANDAGES/DRESSINGS) ×3 IMPLANT
DRSG PAD ABDOMINAL 8X10 ST (GAUZE/BANDAGES/DRESSINGS) ×1 IMPLANT
DURAPREP 26ML APPLICATOR (WOUND CARE) ×3 IMPLANT
ELECT BLADE 4.0 EZ CLEAN MEGAD (MISCELLANEOUS) ×3
ELECT NDL TIP 2.8 STRL (NEEDLE) ×1 IMPLANT
ELECT NEEDLE TIP 2.8 STRL (NEEDLE) ×3 IMPLANT
ELECT REM PT RETURN 9FT ADLT (ELECTROSURGICAL) ×3
ELECTRODE BLDE 4.0 EZ CLN MEGD (MISCELLANEOUS) ×1 IMPLANT
ELECTRODE REM PT RTRN 9FT ADLT (ELECTROSURGICAL) ×1 IMPLANT
GAUZE SPONGE 4X4 12PLY STRL (GAUZE/BANDAGES/DRESSINGS) ×3 IMPLANT
GLOVE BIOGEL PI ORTHO PRO 7.5 (GLOVE) ×2
GLOVE BIOGEL PI ORTHO PRO SZ8 (GLOVE) ×2
GLOVE ORTHO TXT STRL SZ7.5 (GLOVE) ×3 IMPLANT
GLOVE PI ORTHO PRO STRL 7.5 (GLOVE) ×1 IMPLANT
GLOVE PI ORTHO PRO STRL SZ8 (GLOVE) ×1 IMPLANT
GLOVE SURG ORTHO 8.5 STRL (GLOVE) ×3 IMPLANT
GOWN STRL REUS W/ TWL LRG LVL3 (GOWN DISPOSABLE) ×1 IMPLANT
GOWN STRL REUS W/ TWL XL LVL3 (GOWN DISPOSABLE) ×2 IMPLANT
GOWN STRL REUS W/TWL LRG LVL3 (GOWN DISPOSABLE) ×3
GOWN STRL REUS W/TWL XL LVL3 (GOWN DISPOSABLE) ×6
KIT BASIN OR (CUSTOM PROCEDURE TRAY) ×3 IMPLANT
KIT ROOM TURNOVER OR (KITS) ×3 IMPLANT
MANIFOLD NEPTUNE II (INSTRUMENTS) ×3 IMPLANT
NDL 1/2 CIR MAYO (NEEDLE) ×1 IMPLANT
NDL HYPO 25GX1X1/2 BEV (NEEDLE) ×1 IMPLANT
NEEDLE 1/2 CIR MAYO (NEEDLE) ×3 IMPLANT
NEEDLE HYPO 25GX1X1/2 BEV (NEEDLE) ×3 IMPLANT
NS IRRIG 1000ML POUR BTL (IV SOLUTION) ×5 IMPLANT
PACK SHOULDER (CUSTOM PROCEDURE TRAY) ×3 IMPLANT
PAD ABD 8X10 STRL (GAUZE/BANDAGES/DRESSINGS) ×2 IMPLANT
PAD ARMBOARD 7.5X6 YLW CONV (MISCELLANEOUS) ×6 IMPLANT
PIN GUIDE 1.2 (PIN) IMPLANT
PIN GUIDE GLENOPHERE 1.5MX300M (PIN) IMPLANT
PIN METAGLENE 2.5 (PIN) ×2 IMPLANT
SLING ARM FOAM STRAP LRG (SOFTGOODS) ×2 IMPLANT
SLING ARM FOAM STRAP MED (SOFTGOODS) IMPLANT
SPONGE LAP 18X18 X RAY DECT (DISPOSABLE) IMPLANT
SPONGE LAP 4X18 X RAY DECT (DISPOSABLE) ×3 IMPLANT
STRIP CLOSURE SKIN 1/2X4 (GAUZE/BANDAGES/DRESSINGS) ×2 IMPLANT
SUCTION FRAZIER HANDLE 10FR (MISCELLANEOUS) ×2
SUCTION TUBE FRAZIER 10FR DISP (MISCELLANEOUS) ×1 IMPLANT
SUT FIBERWIRE #2 38 T-5 BLUE (SUTURE) ×9
SUT MNCRL AB 4-0 PS2 18 (SUTURE) ×3 IMPLANT
SUT VIC AB 0 CT2 27 (SUTURE) ×3 IMPLANT
SUT VIC AB 2-0 CT1 27 (SUTURE) ×3
SUT VIC AB 2-0 CT1 TAPERPNT 27 (SUTURE) ×1 IMPLANT
SUT VICRYL 0 CT 1 36IN (SUTURE) ×3 IMPLANT
SUTURE FIBERWR #2 38 T-5 BLUE (SUTURE) ×2 IMPLANT
SYR CONTROL 10ML LL (SYRINGE) ×3 IMPLANT
TOWEL OR 17X24 6PK STRL BLUE (TOWEL DISPOSABLE) ×1 IMPLANT
TOWEL OR 17X26 10 PK STRL BLUE (TOWEL DISPOSABLE) ×1 IMPLANT
TOWER CARTRIDGE SMART MIX (DISPOSABLE) IMPLANT
WATER STERILE IRR 1000ML POUR (IV SOLUTION) ×1 IMPLANT
YANKAUER SUCT BULB TIP NO VENT (SUCTIONS) ×3 IMPLANT

## 2017-05-27 NOTE — Interval H&P Note (Signed)
History and Physical Interval Note:  05/27/2017 10:12 AM  [name]  has presented today for surgery, with the diagnosis of Right shoulder osteoarthritis  The various methods of treatment have been discussed with the patient and family. After consideration of risks, benefits and other options for treatment, the patient has consented to  Procedure(s): REVERSE RIGHT SHOULDER ARTHROPLASTY (Right) as a surgical intervention .  The patient's history has been reviewed, patient examined, no change in status, stable for surgery.  I have reviewed the patient's chart and labs.  Questions were answered to the patient's satisfaction.     Elam Ellis,STEVEN R

## 2017-05-27 NOTE — Anesthesia Procedure Notes (Signed)
Anesthesia Regional Block: Interscalene brachial plexus block   Pre-Anesthetic Checklist: ,, timeout performed, Correct Patient, Correct Site, Correct Laterality, Correct Procedure, Correct Position, site marked, Risks and benefits discussed,  Surgical consent,  Pre-op evaluation,  At surgeon's request and post-op pain management  Laterality: Right  Prep: chloraprep       Needles:  Injection technique: Single-shot  Needle Type: Echogenic Stimulator Needle     Needle Length: 5cm  Needle Gauge: 22     Additional Needles:   Procedures: ultrasound guided, nerve stimulator,,,,,,   Nerve Stimulator or Paresthesia:  Response: biceps flexion, 0.45 mA,   Additional Responses:   Narrative:  Start time: 05/27/2017 9:28 AM End time: 05/27/2017 9:41 AM Injection made incrementally with aspirations every 5 mL.  Performed by: Personally  Anesthesiologist: Harace Mccluney  Additional Notes: Functioning IV was confirmed and monitors were applied.  A 19mm 22ga Arrow echogenic stimulator needle was used. Sterile prep and drape,hand hygiene and sterile gloves were used.  Negative aspiration and negative test dose prior to incremental administration of local anesthetic. The patient tolerated the procedure well.  Ultrasound guidance: relevent anatomy identified, needle position confirmed, local anesthetic spread visualized around nerve(s), vascular puncture avoided.  Image printed for medical record.

## 2017-05-27 NOTE — Anesthesia Procedure Notes (Addendum)
Procedure Name: Intubation Date/Time: 05/27/2017 11:51 AM Performed by: Salli Quarry Chesley Valls Pre-anesthesia Checklist: Patient identified, Emergency Drugs available, Suction available and Patient being monitored Patient Re-evaluated:Patient Re-evaluated prior to induction Oxygen Delivery Method: Circle System Utilized Preoxygenation: Pre-oxygenation with 100% oxygen Induction Type: IV induction Ventilation: Mask ventilation without difficulty Laryngoscope Size: Miller and 2 Grade View: Grade I Tube type: Oral Tube size: 7.0 mm Number of attempts: 1 Airway Equipment and Method: Stylet and Oral airway Placement Confirmation: ETT inserted through vocal cords under direct vision,  positive ETCO2 and breath sounds checked- equal and bilateral Secured at: 21 cm Tube secured with: Tape Dental Injury: Teeth and Oropharynx as per pre-operative assessment

## 2017-05-27 NOTE — Anesthesia Preprocedure Evaluation (Signed)
Anesthesia Evaluation  Patient identified by MRN, date of birth, ID band Patient awake    Reviewed: Allergy & Precautions, H&P , NPO status , Patient's Chart, lab work & pertinent test results  History of Anesthesia Complications (+) PONV and history of anesthetic complications  Airway Mallampati: II   Neck ROM: full    Dental   Pulmonary    breath sounds clear to auscultation       Cardiovascular hypertension,  Rhythm:regular Rate:Normal     Neuro/Psych  Headaches,    GI/Hepatic GERD  ,  Endo/Other    Renal/GU      Musculoskeletal  (+) Arthritis ,   Abdominal   Peds  Hematology   Anesthesia Other Findings   Reproductive/Obstetrics                             Anesthesia Physical Anesthesia Plan  ASA: II  Anesthesia Plan: General   Post-op Pain Management:  Regional for Post-op pain   Induction:   PONV Risk Score and Plan: 4 or greater and Ondansetron, Dexamethasone, Propofol, Midazolam, Scopolamine patch - Pre-op and Treatment may vary due to age or medical condition  Airway Management Planned: Oral ETT  Additional Equipment:   Intra-op Plan:   Post-operative Plan:   Informed Consent: I have reviewed the patients History and Physical, chart, labs and discussed the procedure including the risks, benefits and alternatives for the proposed anesthesia with the patient or authorized representative who has indicated his/her understanding and acceptance.     Plan Discussed with: CRNA, Anesthesiologist and Surgeon  Anesthesia Plan Comments:         Anesthesia Quick Evaluation

## 2017-05-27 NOTE — Discharge Instructions (Signed)
Ice to the shoulder as much as possible.  Keep the incision covered and clean and dry for one week, then ok to get it wet in the shower.  Please wear sling while up and around but ok to have off while seated.  Keep pillow behind the elbow or under the arm to keep the arm across your waist.  No push pull or lift.  Do not reach behind your back or push out of a chair. Ok to do hand to face.  Do exercises 4-5 times per day as instructed  Follow up with Dr Veverly Fells in the office in two weeks [phone]

## 2017-05-27 NOTE — Interval H&P Note (Signed)
History and Physical Interval Note:  05/27/2017 10:15 AM  [name]  has presented today for surgery, with the diagnosis of Right shoulder osteoarthritis  The various methods of treatment have been discussed with the patient and family. After consideration of risks, benefits and other options for treatment, the patient has consented to  Procedure(s): REVERSE RIGHT SHOULDER ARTHROPLASTY (Right) as a surgical intervention .  The patient's history has been reviewed, patient examined, no change in status, stable for surgery.  I have reviewed the patient's chart and labs.  Questions were answered to the patient's satisfaction.     Rande Dario,STEVEN R

## 2017-05-27 NOTE — Brief Op Note (Signed)
05/27/2017  1:54 PM  PATIENT:  [name]  71 y.o. female  PRE-OPERATIVE DIAGNOSIS:  Right shoulder osteoarthritis, rotator cuff insufficency  POST-OPERATIVE DIAGNOSIS:  Right shoulder osteoarthritis, rotator cuff insufficiency  PROCEDURE:  Procedure(s): REVERSE RIGHT SHOULDER ARTHROPLASTY (Right) DePuy Delta Xtend   SURGEON:  Surgeon(s) and Role:    Netta Cedars, MD - Primary  PHYSICIAN ASSISTANT:   ASSISTANTS: Narda Amber, PA-C   ANESTHESIA:   regional and general  EBL:  Total I/O In: 800 [I.V.:800] Out: 150 [Blood:150]  BLOOD ADMINISTERED:none  DRAINS: none   LOCAL MEDICATIONS USED:  MARCAINE     SPECIMEN:  No Specimen  DISPOSITION OF SPECIMEN:  N/A  COUNTS:  YES  TOURNIQUET:  * No tourniquets in log *  DICTATION: .Other Dictation: Dictation Number [id number]  PLAN OF CARE: Admit to inpatient   PATIENT DISPOSITION:  PACU - hemodynamically stable.   Delay start of Pharmacological VTE agent (>24hrs) due to surgical blood loss or risk of bleeding: not applicable

## 2017-05-27 NOTE — Transfer of Care (Cosign Needed)
Immediate Anesthesia Transfer of Care Note  Patient: [name]  Procedure(s) Performed: Procedure(s): REVERSE RIGHT SHOULDER ARTHROPLASTY (Right)  Patient Location: PACU  Anesthesia Type:GA combined with regional for post-op pain  Level of Consciousness: awake, alert  and oriented  Airway & Oxygen Therapy: Patient Spontanous Breathing and Patient connected to nasal cannula oxygen  Post-op Assessment: Report given to RN and Post -op Vital signs reviewed and stable  Post vital signs: Reviewed and stable  Last Vitals:  Vitals:   05/27/17 0945 05/27/17 0950  BP: (!) 116/57 (!) 126/59  Pulse: 91 91  Resp: (!) 24 14  Temp:     SpO2 99% HR 81 RR 15 BP 112/67 Last Pain:  Vitals:   05/27/17 0940  TempSrc:   PainSc: 0-No pain      Patients Stated Pain Goal: 3 (11/46/43 1427)  Complications: No apparent anesthesia complications

## 2017-05-28 LAB — BASIC METABOLIC PANEL
ANION GAP: 7 (ref 5–15)
BUN: 11 mg/dL (ref 6–20)
CHLORIDE: 102 mmol/L (ref 101–111)
CO2: 31 mmol/L (ref 22–32)
Calcium: 8.8 mg/dL — ABNORMAL LOW (ref 8.9–10.3)
Creatinine, Ser: 0.68 mg/dL (ref 0.44–1.00)
GFR calc non Af Amer: >60 mL/min (ref >60)
Glucose, Bld: 138 mg/dL — ABNORMAL HIGH (ref 65–99)
POTASSIUM: 3.4 mmol/L — AB (ref 3.5–5.1)
SODIUM: 140 mmol/L (ref 135–145)

## 2017-05-28 LAB — HEMOGLOBIN AND HEMATOCRIT, BLOOD
HCT: 35.9 % — ABNORMAL LOW (ref 36.0–46.0)
Hemoglobin: 11.7 g/dL — ABNORMAL LOW (ref 12.0–15.0)

## 2017-05-28 MED ORDER — SODIUM CHLORIDE 0.45 % IV BOLUS
500.0000 mL | Freq: Once | INTRAVENOUS | Status: AC
  Administered 2017-05-28: 500 mL via INTRAVENOUS

## 2017-05-28 MED ORDER — KETOROLAC TROMETHAMINE 15 MG/ML IJ SOLN
7.5000 mg | Freq: Four times a day (QID) | INTRAMUSCULAR | Status: DC | PRN
  Administered 2017-05-28 – 2017-05-29 (×2): 7.5 mg via INTRAVENOUS
  Filled 2017-05-28 (×2): qty 1

## 2017-05-28 MED ORDER — POTASSIUM CHLORIDE CRYS ER 10 MEQ PO TBCR
10.0000 meq | EXTENDED_RELEASE_TABLET | Freq: Two times a day (BID) | ORAL | Status: DC
  Administered 2017-05-28 – 2017-05-29 (×3): 10 meq via ORAL
  Filled 2017-05-28 (×3): qty 1

## 2017-05-28 NOTE — Progress Notes (Addendum)
Subjective: 1 Day Post-Op Procedure(s) (LRB): REVERSE RIGHT SHOULDER ARTHROPLASTY (Right) Patient reports pain as moderate to severe. Reports nausea, vomited yesterday but not today so far. States her dilaudid is being held due to hypotension.  Denies numbness or tingling.  Objective: Vital signs in last 24 hours: Temp:  [98.1 F (36.7 C)-98.5 F (36.9 C)] 98.1 F (36.7 C) (07/21 0424) Pulse Rate:  [62-94] 62 (07/21 0424) Resp:  [12-24] 16 (07/21 0424) BP: (89-138)/(45-68) 90/45 (07/21 0424) SpO2:  [92 %-100 %] 96 % (07/21 0424)  Intake/Output from previous day: 07/20 0701 - 07/21 0700 In: 1280 [P.O.:480; I.V.:800] Out: 350 [Urine:200; Blood:150] Intake/Output this shift: Total I/O In: 360 [P.O.:360] Out: -    Recent Labs  05/28/17 0502  HGB 11.7*    Recent Labs  05/28/17 0502  HCT 35.9*    Recent Labs  05/28/17 0502  NA 140  K 3.4*  CL 102  CO2 31  BUN 11  CREATININE 0.68  GLUCOSE 138*  CALCIUM 8.8*   No results for input(s): LABPT, INR in the last 72 hours.  Neurologically intact ABD soft Neurovascular intact Sensation intact distally Intact pulses distally Dorsiflexion/Plantar flexion intact Incision: dressing C/D/I and no drainage No cellulitis present Compartment soft no sign of DVT  Assessment/Plan: 1 Day Post-Op Procedure(s) (LRB): REVERSE RIGHT SHOULDER ARTHROPLASTY (Right) Advance diet Up with therapy  Hold HCTZ due to hypotension Fluid bolus for hypotension Anti-emetics ordered Slight hypokalemia this AM- Replete K, orders placed Will add low dose toradol for pain control Allergy to Norco and Percocet- Tyl #3 prn, Dilaudid for severe breakthrough pain prn  BISSELL, JACLYN M. 05/28/2017, 8:21 AM   I have seen and examined Ms Leever and agree with the above assessment and plan.  Possible D/C later today.

## 2017-05-28 NOTE — Progress Notes (Signed)
Patient had one episode of vomiting. Patient stated she feels better after vomiting. Will continue to monitor.

## 2017-05-28 NOTE — Evaluation (Signed)
Occupational Therapy Evaluation Patient Details Name: [name] MRN: [id number] DOB: September 12, 1946 Today's Date: 05/28/2017    History of Present Illness Pt is a 71 y/o female s/p REVERSE RIGHT SHOULDER ARTHROPLASTY. Pt has a past medical history of Arthritis;Breast cancer; DJD; TIA; Hyperlipidemia; Hypertension;  Total hip arthroplasty (Bilateral, 1987, 1989, 1997); back fusion (2005, 2008, 2011, 2015); Shoulder arthroscopy (Left); (12/13/2013); and Cataract extraction w/ intraocular lens  implant, bilateral;   Clinical Impression   PTA Pt independent in ADL and modified independent in ADL with SPC (Pt used RUE to walk with cane) OT RECOMMENDING PT CONSULT FOR MOBILITY EVALUATION. Eval limited today by Pt's pain and nausea to bed level eval - however Pt was very pleasant and willing to perform bed level exercised and education for shoulder. Shoulder handout reviewed in full and all exercises performed according to conservative protocol. Pt is currently complaining of vertical diplopia with left gaze. Pt will benefit from skilled OT in the acute setting to maximize safety and independence in ADL and functional transfers as well as reinforcing conservative protocol and HEP. Next session to mobilize Pt and reinforce education.     Follow Up Recommendations  DC plan and follow up therapy as arranged by surgeon    Equipment Recommendations  Tub/shower seat    Recommendations for Other Services PT consult (PTA Pt ambulated with SPC using RUE)     Precautions / Restrictions Precautions Precautions: Shoulder Type of Shoulder Precautions: Conservative Protocol Shoulder Interventions: Shoulder sling/immobilizer;At all times;Off for dressing/bathing/exercises Precaution Booklet Issued: Yes (comment) Precaution Comments: Shoulder Handout reviewed in full Required Braces or Orthoses: Sling Restrictions Weight Bearing Restrictions: Yes RUE Weight Bearing: Non weight bearing      Mobility  Bed Mobility               General bed mobility comments: deferred at this time due to pain and nausea  Transfers                 General transfer comment: unable to perform at this time due to pain and nausea. Pt states that she has been up in chair and ambulating to bathroom with RN staff    Balance                                           ADL either performed or assessed with clinical judgement   ADL                                         General ADL Comments: please see shoulder section below, mobility deferred this session due to nausea and pain - per Pt report she has been up in the chair and ambulating with staff. Pt also reports that she walks with a cane at baseline using her RUE typically     Vision Baseline Vision/History: Wears glasses Wears Glasses: Reading only Patient Visual Report: Diplopia Vision Assessment?: Yes Diplopia Assessment: Disappears with one eye closed;Objects split on top of one another;Present in near gaze;Present in far gaze;Only with left gaze (far left gaze) Additional Comments: Pt states that sometimes she gets triple vision from dry-eye     Perception     Praxis      Pertinent Vitals/Pain Pain Assessment: 0-10 Pain Score: 9  Pain Location: R  shoulder - clavicle into arm pit Pain Descriptors / Indicators: Discomfort;Sore;Sharp;Spasm Pain Intervention(s): Limited activity within patient's tolerance;Monitored during session;Ice applied     Hand Dominance Right   Extremity/Trunk Assessment Upper Extremity Assessment Upper Extremity Assessment: RUE deficits/detail RUE Deficits / Details: deficits in strength and ROM as expected post-op RUE: Unable to fully assess due to pain RUE Coordination: decreased gross motor           Communication Communication Communication: No difficulties   Cognition Arousal/Alertness: Awake/alert Behavior During Therapy: WFL for tasks  assessed/performed Overall Cognitive Status: Within Functional Limits for tasks assessed                                     General Comments       Exercises Exercises: Shoulder Shoulder Exercises Elbow Flexion: AROM;Right;10 reps;Supine Elbow Extension: AROM;Right;10 reps;Supine Wrist Flexion: AROM;Right;Supine (educated to perform frequently throughout day) Wrist Extension: AROM;Left;Supine (educated to perform frequently throughout day) Digit Composite Flexion: AROM;Left;Supine (educated to perform frequently throughout day) Neck Flexion: AROM Neck Extension: AROM Neck Lateral Flexion - Right: AROM Neck Lateral Flexion - Left: AROM   Shoulder Instructions Shoulder Instructions Donning/doffing shirt without moving shoulder: Maximal assistance Method for sponge bathing under operated UE: Supervision/safety Donning/doffing sling/immobilizer: Maximal assistance Correct positioning of sling/immobilizer: Maximal assistance ROM for elbow, wrist and digits of operated UE: Modified independent Sling wearing schedule (on at all times/off for ADL's): Modified independent Proper positioning of operated UE when showering: Supervision/safety Positioning of UE while sleeping: Moderate assistance    Home Living Family/patient expects to be discharged to:: Private residence Living Arrangements: Spouse/significant other Available Help at Discharge: Family;Available 24 hours/day Type of Home: House Home Access: Stairs to enter [employer] of Steps: 4 Entrance Stairs-Rails: Right;Left;Can reach both Home Layout: Able to live on main level with bedroom/bathroom     Bathroom Shower/Tub: Occupational psychologist: Standard Bathroom Accessibility: Yes How Accessible: Accessible via walker Home Equipment: Toilet riser;Grab bars - toilet;Walker - 2 wheels;Hand held shower head;Cane - quad;Adaptive equipment Adaptive Equipment: Reacher        Prior  Functioning/Environment Level of Independence: Independent        Comments: loves travel        OT Problem List: Decreased strength;Decreased range of motion;Decreased activity tolerance;Impaired balance (sitting and/or standing);Impaired vision/perception;Decreased knowledge of precautions;Impaired UE functional use;Pain      OT Treatment/Interventions: Self-care/ADL training;Therapeutic exercise;Therapeutic activities;Patient/family education;Balance training;Visual/perceptual remediation/compensation    OT Goals(Current goals can be found in the care plan section) Acute Rehab OT Goals Patient Stated Goal: to get back to traveling OT Goal Formulation: With patient Time For Goal Achievement: 06/11/17 Potential to Achieve Goals: Good ADL Goals Pt Will Transfer to Toilet: with modified independence;ambulating Pt Will Perform Toileting - Clothing Manipulation and hygiene: with min guard assist;with caregiver independent in assisting;sit to/from stand Pt/caregiver will Perform Home Exercise Program: Right Upper extremity;Independently;With written HEP provided Additional ADL Goal #1: Pt will don/doff sling with mod A Pt directing and caregiver independent in assisting  OT Frequency: Min 3X/week   Barriers to D/C:            Co-evaluation              AM-PAC PT "6 Clicks" Daily Activity     Outcome Measure Help from another person eating meals?: A Little Help from another person taking care of personal grooming?: A Little Help from  another person toileting, which includes using toliet, bedpan, or urinal?: A Little Help from another person bathing (including washing, rinsing, drying)?: A Lot Help from another person to put on and taking off regular upper body clothing?: A Lot Help from another person to put on and taking off regular lower body clothing?: A Lot 6 Click Score: 15   End of Session Equipment Utilized During Treatment: Oxygen (2L) Nurse Communication: Mobility  status;Patient requests pain meds;Precautions;Weight bearing status  Activity Tolerance: Patient limited by pain;Other (comment) (and nausea) Patient left: in bed;with call bell/phone within reach;with SCD's reapplied  OT Visit Diagnosis: Unsteadiness on feet (R26.81);Muscle weakness (generalized) (M62.81);Pain Pain - Right/Left: Right Pain - part of body: Shoulder                Time: 4388-8757 OT Time Calculation (min): 49 min Charges:  OT General Charges $OT Visit: 1 Procedure OT Evaluation $OT Eval Moderate Complexity: 1 Procedure OT Treatments $Self Care/Home Management : 8-22 mins $Therapeutic Exercise: 8-22 mins G-Codes:     Hulda Humphrey OTR/L [place] 05/28/2017, 4:46 PM

## 2017-05-29 MED ORDER — ONDANSETRON HCL 4 MG PO TABS: 4.0000 mg | ORAL_TABLET | Freq: Four times a day (QID) | ORAL | 0 refills | Status: DC | PRN

## 2017-05-29 MED ORDER — SODIUM CHLORIDE 0.9 % IV BOLUS (SEPSIS)
500.0000 mL | Freq: Once | INTRAVENOUS | Status: AC
  Administered 2017-05-29: 500 mL via INTRAVENOUS

## 2017-05-29 MED ORDER — HYDROMORPHONE HCL 2 MG PO TABS: 1.0000 mg | ORAL_TABLET | Freq: Four times a day (QID) | ORAL | 0 refills | Status: DC | PRN

## 2017-05-29 NOTE — Progress Notes (Signed)
Occupational Therapy Treatment Patient Details Name: [name] MRN: [id number] DOB: 12-24-1945 Today's Date: 05/29/2017    History of present illness Pt is a 71 y/o female s/p REVERSE RIGHT SHOULDER ARTHROPLASTY. Pt has a past medical history of Arthritis;Breast cancer; DJD; TIA; Hyperlipidemia; Hypertension;  Total hip arthroplasty (Bilateral, 1987, 1989, 1997); back fusion (2005, 2008, 2011, 2015); Shoulder arthroscopy (Left); (12/13/2013); and Cataract extraction w/ intraocular lens  implant, bilateral;   OT comments  Pt progressing towards established goals. Pt and husband educated on UB dressing and demonstrated understanding by donning pt shirt with supervision and Min VCs for sequencing. Reviewed pt exercises and shoulder handout. Pt and husband demonstrating understanding of sling management and positioning; Min VCs to tighten sling strap. Pt would benefit from PT eval for functional mobility since she used cane with RUE PTA. Currently, pt requiring Min A for safety and balance during functional mobility with cane. Continue to recommend dc home once medically stable per physician. All shoulder education reviewed in preparation for dc later today.    Follow Up Recommendations  DC plan and follow up therapy as arranged by surgeon    Equipment Recommendations  Tub/shower seat    Recommendations for Other Services PT consult (PTA Pt ambulated with SPC using RUE)    Precautions / Restrictions Precautions Precautions: Shoulder Type of Shoulder Precautions: Conservative Protocol Shoulder Interventions: Shoulder sling/immobilizer;At all times;Off for dressing/bathing/exercises Precaution Booklet Issued: Yes (comment) Precaution Comments: Shoulder Handout reviewed in full Required Braces or Orthoses: Sling Restrictions Weight Bearing Restrictions: Yes RUE Weight Bearing: Non weight bearing       Mobility Bed Mobility Overal bed mobility: Needs Assistance Bed Mobility:  Rolling;Sidelying to Sit Rolling: Min assist Sidelying to sit: Min assist       General bed mobility comments: Practiced bed mobility and provided education on safe technique using log roll. Pt and husbanmd demonstrated understanding  Transfers Overall transfer level: Needs assistance Equipment used: Quad cane;1 person hand held assist Transfers: Sit to/from Stand Sit to Stand: Min guard         General transfer comment: Min gaurd for safety.     Balance Overall balance assessment: Needs assistance Sitting-balance support: No upper extremity supported;Feet supported Sitting balance-Leahy Scale: Good     Standing balance support: Single extremity supported;During functional activity Standing balance-Leahy Scale: Fair                             ADL either performed or assessed with clinical judgement   ADL Overall ADL's : Needs assistance/impaired           Upper Body Bathing Details (indicate cue type and reason): Educated pt on UB bathing and compensatory technique for shoulder precautions     Upper Body Dressing : Min guard;Cueing for sequencing;Cueing for compensatory techniques;Cueing for UE precautions;Sitting;With caregiver independent assisting Upper Body Dressing Details (indicate cue type and reason): Pt educated on UB dressing. Husband performed dressing demosntrating safe technique Lower Body Dressing: Moderate assistance;Sit to/from stand;With adaptive equipment;Cueing for sequencing;Cueing for compensatory techniques Lower Body Dressing Details (indicate cue type and reason): Pt requiring Mod A for LB ADLs and use of AE. Pt used AE PTA for LB dressing Toilet Transfer: Min guard;Ambulation (Cane; simulated to recliner)           Functional mobility during ADLs: Minimal assistance;Cane (PTA, pt used cane with RUE. Pt would benefit from PT order) General ADL Comments: Reviewed shoulder handout including dressing,  bathing, sleep position, ice,  sling management, and precautions.     Vision  Continues to report double vision - especially when looking to L. States that she had double vision this morning, but at time of session it had resolved.    Perception     Praxis      Cognition Arousal/Alertness: Awake/alert Behavior During Therapy: WFL for tasks assessed/performed Overall Cognitive Status: Within Functional Limits for tasks assessed                                          Exercises Exercises: Shoulder Shoulder Exercises Elbow Flexion: AROM;Right;Seated Elbow Extension: AROM;Right;Seated Wrist Flexion: AROM;Right;Seated (educated to perform frequently throughout day) Wrist Extension: AROM;Left;Seated (educated to perform frequently throughout day) Digit Composite Flexion: AROM;Left;Supine (educated to perform frequently throughout day) Neck Flexion: AROM Neck Extension: AROM Neck Lateral Flexion - Right: AROM Neck Lateral Flexion - Left: AROM   Shoulder Instructions Shoulder Instructions Donning/doffing shirt without moving shoulder: Caregiver independent with task;Patient able to independently direct caregiver;Supervision/safety Method for sponge bathing under operated UE: Supervision/safety;Caregiver independent with task;Patient able to independently direct caregiver Donning/doffing sling/immobilizer: Caregiver independent with task;Patient able to independently direct caregiver;Supervision/safety Correct positioning of sling/immobilizer: Supervision/safety;Caregiver independent with task;Patient able to independently direct caregiver ROM for elbow, wrist and digits of operated UE: Modified independent Sling wearing schedule (on at all times/off for ADL's): Modified independent Proper positioning of operated UE when showering: Supervision/safety Positioning of UE while sleeping: Minimal assistance;Caregiver independent with task     General Comments Pt husband present for session     Pertinent Vitals/ Pain       Pain Assessment: Faces Faces Pain Scale: Hurts even more Pain Location: R shoulder - clavicle into arm pit Pain Descriptors / Indicators: Discomfort;Sore;Sharp;Spasm Pain Intervention(s): Monitored during session;Repositioned;Ice applied  Home Living                                          Prior Functioning/Environment              Frequency  Min 3X/week        Progress Toward Goals  OT Goals(current goals can now be found in the care plan section)  Progress towards OT goals: Progressing toward goals  Acute Rehab OT Goals Patient Stated Goal: to get back to traveling OT Goal Formulation: With patient Time For Goal Achievement: 06/11/17 Potential to Achieve Goals: Good ADL Goals Pt Will Transfer to Toilet: with modified independence;ambulating Pt Will Perform Toileting - Clothing Manipulation and hygiene: with min guard assist;with caregiver independent in assisting;sit to/from stand Pt/caregiver will Perform Home Exercise Program: Right Upper extremity;Independently;With written HEP provided Additional ADL Goal #1: Pt will don/doff sling with mod A Pt directing and caregiver independent in assisting  Plan Discharge plan remains appropriate    Co-evaluation                 AM-PAC PT "6 Clicks" Daily Activity     Outcome Measure   Help from another person eating meals?: A Little Help from another person taking care of personal grooming?: A Little Help from another person toileting, which includes using toliet, bedpan, or urinal?: A Little Help from another person bathing (including washing, rinsing, drying)?: A Lot Help from another person to put on and taking  off regular upper body clothing?: A Lot Help from another person to put on and taking off regular lower body clothing?: A Lot 6 Click Score: 15    End of Session Equipment Utilized During Treatment: Other (comment) (Sling)  OT Visit Diagnosis:  Unsteadiness on feet (R26.81);Muscle weakness (generalized) (M62.81);Pain Pain - Right/Left: Right Pain - part of body: Shoulder   Activity Tolerance Patient tolerated treatment well   Patient Left with call bell/phone within reach;in chair;with family/visitor present   Nurse Communication Mobility status;Precautions;Weight bearing status        Time: 4383-7793 OT Time Calculation (min): 29 min  Charges: OT General Charges $OT Visit: 1 Procedure OT Treatments $Self Care/Home Management : 23-37 mins  [place], OTR/L Acute Rehab Pager: [phone] Office: [place] 05/29/2017, 9:44 AM

## 2017-05-29 NOTE — Progress Notes (Signed)
   Subjective: 2 Days Post-Op Procedure(s) (LRB): REVERSE RIGHT SHOULDER ARTHROPLASTY (Right) Patient reports pain as moderate.   Patient seen in rounds for Dr. Veverly Fells. Patient is well, but has had some minor complaints of lightheadedness and double vision. She has had some trouble with hypotension. No SOB or chest pain. Reports that her right shoulder is quite uncomfortable.   Objective: Vital signs in last 24 hours: Temp:  [98.7 F (37.1 C)-99.6 F (37.6 C)] 99.6 F (37.6 C) (07/22 0500) Pulse Rate:  [70-86] 78 (07/22 0500) Resp:  [18-19] 18 (07/22 0500) BP: (103-115)/(47-55) 103/47 (07/22 0500) SpO2:  [97 %-100 %] 97 % (07/22 0500)  Intake/Output from previous day:  Intake/Output Summary (Last 24 hours) at 05/29/17 0814 Last data filed at 05/29/17 0300  Gross per 24 hour  Intake          2129.17 ml  Output                3 ml  Net          2126.17 ml     Labs:  Recent Labs  05/28/17 0502  HGB 11.7*    Recent Labs  05/28/17 0502  HCT 35.9*    Recent Labs  05/28/17 0502  NA 140  K 3.4*  CL 102  CO2 31  BUN 11  CREATININE 0.68  GLUCOSE 138*  CALCIUM 8.8*    EXAM General - Patient is Alert and Oriented Extremity - Neurologically intact Intact pulses distally Dorsiflexion/Plantar flexion intact No cellulitis present Dressing/Incision - clean, dry, no drainage Motor Function - intact, moving hand and fingers well on exam.   Past Medical History:  Diagnosis Date  . Arthritis   . Arthropathy of right shoulder   . Breast cancer ([place])   . Breast cancer ([place])   . DJD (degenerative joint disease)   . Family history of adverse reaction to anesthesia    " My sister had 2 migrine strokes waking up from her back procedure."  . GERD (gastroesophageal reflux disease)   . Headache(784.0)    migraines  . History of cancer chemotherapy 1996   ALSO RADIATION - BREAST CANCER  . History of skin cancer   . Hx-TIA (transient ischemic attack)    X2  .  Hyperlipidemia   . Hypertension   . Personal history of chemotherapy 1997  . Personal history of radiation therapy 1996  . PONV (postoperative nausea and vomiting)    desat during procedure on 10/2015 led oxygen use for 1 month post-op  . Squamous cell carcinoma   . Wears glasses     Assessment/Plan: 2 Days Post-Op Procedure(s) (LRB): REVERSE RIGHT SHOULDER ARTHROPLASTY (Right) Active Problems:   S/P shoulder replacement, right  Estimated body mass index is 29.96 kg/m as calculated from the following:   Height as of 05/24/17: 5' (1.524 m).   Weight as of 05/24/17: 69.6 kg (153 lb 6.4 oz). Advance diet  Maintain use of sling on right shoulder. 581ml bolus this morning. Plan for DC home this afternoon is patient's BP stabilizes. Will hold BP meds this morning.   Ardeen Jourdain, PA-C Orthopaedic Surgery 05/29/2017, 8:14 AM

## 2017-05-29 NOTE — Progress Notes (Signed)
Spoke to Dr. Veverly Fells; discussed pt's blood pressures this afternoon s/p IVF bolus & maintenance fluids resumed. Verbal OK from MD pt is OK for discharge.

## 2017-05-29 NOTE — Evaluation (Signed)
Physical Therapy Evaluation Patient Details Name: [name] MRN: [id number] DOB: [birth year] Today's Date: 05/29/2017   History of Present Illness  Pt is a 71 y/o female s/p REVERSE RIGHT SHOULDER ARTHROPLASTY. Pt has a past medical history of Arthritis;Breast cancer; DJD; TIA; Hyperlipidemia; Hypertension;  Total hip arthroplasty (Bilateral, 1987, 1989, 1997); back fusion (2005, 2008, 2011, 2015); Shoulder arthroscopy (Left); (12/13/2013); and Cataract extraction w/ intraocular lens  implant, bilateral;  Clinical Impression  Patient was able to ambulate 25' with her single point cane but was limited by nausea. Prior to surgery she used her cane in her right hand. Her right leg is weak so she should have been using it in the left hand anyway. She would benefit from home health therapy to work on gait pattern. Her cane was adjusted so she could shift weight off of her right side but she reports she flet it in her back. Her cane was then adjusted back. Nursing notified of the nausea. Acute therapy will continue to follow the patient.     Follow Up Recommendations DC plan and follow up therapy as arranged by surgeon    Equipment Recommendations  3in1 (PT)    Recommendations for Other Services       Precautions / Restrictions Precautions Precautions: Shoulder Type of Shoulder Precautions: Conservative Protocol Shoulder Interventions: Shoulder sling/immobilizer;At all times;Off for dressing/bathing/exercises Precaution Booklet Issued: Yes (comment) Precaution Comments: Shoulder Handout reviewed in full Required Braces or Orthoses: Sling Restrictions Weight Bearing Restrictions: Yes RUE Weight Bearing: Non weight bearing      Mobility  Bed Mobility Overal bed mobility: Needs Assistance Bed Mobility: Rolling;Sidelying to Sit Rolling: Min assist Sidelying to sit: Min assist       General bed mobility comments: Min a to come to a seated postion. Patient will have her husband at  home to assist.   Transfers Overall transfer level: Needs assistance Equipment used: Quad cane;1 person hand held assist Transfers: Sit to/from Stand Sit to Stand: Min guard         General transfer comment: Adjusted patients quad can so the large part was facing out. Advised to stand for 10 seocnds before moving 2nd to baseline syncope.   Ambulation/Gait Ambulation/Gait assistance: Min guard Ambulation Distance (Feet): 30 Feet Assistive device: Quad cane Gait Pattern/deviations: Step-to pattern;Decreased step length - right;Decreased stance time - right     General Gait Details: Patient felt like she was falling to the right. Therapy lowered her cane to the left so she could lean on it more but she flet like that hurt her back. She was advised to try each at home and see what was more effective for her.   Stairs Stairs:  (Stairs not attmepted 2nd to nausea )          Wheelchair Mobility    Modified Rankin (Stroke Patients Only)       Balance Overall balance assessment: Needs assistance Sitting-balance support: No upper extremity supported;Feet supported Sitting balance-Leahy Scale: Good     Standing balance support: Single extremity supported;During functional activity Standing balance-Leahy Scale: Fair                               Pertinent Vitals/Pain Pain Assessment: Faces Faces Pain Scale: Hurts even more Pain Location: R shoulder - clavicle into arm pit Pain Descriptors / Indicators: Discomfort;Sore;Sharp;Spasm Pain Intervention(s): Monitored during session    Home Living Family/patient expects to be discharged to:: Private  residence Living Arrangements: Spouse/significant other Available Help at Discharge: Family;Available 24 hours/day Type of Home: House Home Access: Stairs to enter Entrance Stairs-Rails: Right;Left;Can reach both Entrance Stairs-Number of Steps: 4 Home Layout: Able to live on main level with bedroom/bathroom Home  Equipment: Toilet riser;Grab bars - toilet;Walker - 2 wheels;Hand held shower head;Cane - quad;Adaptive equipment      Prior Function Level of Independence: Independent         Comments: loves travel     Hand Dominance   Dominant Hand: Right    Extremity/Trunk Assessment   Upper Extremity Assessment Upper Extremity Assessment: RUE deficits/detail RUE Deficits / Details: deficits in strength and ROM as expected post-op RUE: Unable to fully assess due to pain RUE Coordination: decreased gross motor    Lower Extremity Assessment Lower Extremity Assessment: RLE deficits/detail RLE Deficits / Details: Baseline right lower extremity weakness       Communication   Communication: No difficulties  Cognition Arousal/Alertness: Awake/alert Behavior During Therapy: WFL for tasks assessed/performed Overall Cognitive Status: Within Functional Limits for tasks assessed                                        General Comments General comments (skin integrity, edema, etc.): Pt husband present for session    Exercises Shoulder Exercises Elbow Flexion: AROM;Right;Seated Elbow Extension: AROM;Right;Seated Wrist Flexion: AROM;Right;Seated (educated to perform frequently throughout day) Wrist Extension: AROM;Left;Seated (educated to perform frequently throughout day) Digit Composite Flexion: AROM;Left;Supine (educated to perform frequently throughout day) Neck Flexion: AROM Neck Extension: AROM Neck Lateral Flexion - Right: AROM Neck Lateral Flexion - Left: AROM Donning/doffing shirt without moving shoulder: Caregiver independent with task;Patient able to independently direct caregiver;Supervision/safety Method for sponge bathing under operated UE: Supervision/safety;Caregiver independent with task;Patient able to independently direct caregiver Donning/doffing sling/immobilizer: Caregiver independent with task;Patient able to independently direct  caregiver;Supervision/safety Correct positioning of sling/immobilizer: Supervision/safety;Caregiver independent with task;Patient able to independently direct caregiver ROM for elbow, wrist and digits of operated UE: Modified independent Sling wearing schedule (on at all times/off for ADL's): Modified independent Proper positioning of operated UE when showering: Supervision/safety Positioning of UE while sleeping: Minimal assistance;Caregiver independent with task   Assessment/Plan    PT Assessment    PT Problem List         PT Treatment Interventions      PT Goals (Current goals can be found in the Care Plan section)  Acute Rehab PT Goals Patient Stated Goal: to get back to traveling PT Goal Formulation: With patient    Frequency     Barriers to discharge        Co-evaluation               AM-PAC PT "6 Clicks" Daily Activity  Outcome Measure Difficulty turning over in bed (including adjusting bedclothes, sheets and blankets)?: A Little Difficulty moving from lying on back to sitting on the side of the bed? : A Lot Difficulty sitting down on and standing up from a chair with arms (e.g., wheelchair, bedside commode, etc,.)?: A Little Help needed moving to and from a bed to chair (including a wheelchair)?: A Little Help needed walking in hospital room?: A Lot Help needed climbing 3-5 steps with a railing? : A Lot 6 Click Score: 15    End of Session              Time: 1100-1130 PT Time  Calculation (min) (ACUTE ONLY): 30 min   Charges:   PT Evaluation $PT Eval Moderate Complexity: 1 Procedure     PT G Codes:          Carney Living PT DPT  05/29/2017, 12:55 PM

## 2017-05-29 NOTE — Progress Notes (Signed)
PA, Cecilio Asper, paged to inquire about IV NS bolus ordered with no current IV access.

## 2017-05-29 NOTE — Care Management Note (Signed)
Case Management Note  Patient Details  Name: [name] MRN: [id number] Date of Birth: 1946/10/12  Subjective/Objective:                 Patient with order to DC to home today. Chart reviewed. No Home Health or Equipment needs, no unacknowledged Case Management consults or medication needs identified at the time of this note. Plan for DC to home. If needs arise today prior to discharge, please call Carles Collet RN CM at [phone].    Action/Plan:   Expected Discharge Date:  05/29/17               Expected Discharge Plan:  Home/Self Care  In-House Referral:     Discharge planning Services  CM Consult  Post Acute Care Choice:    Choice offered to:     DME Arranged:    DME Agency:     HH Arranged:    HH Agency:     Status of Service:  Completed, signed off  If discussed at [employer] of Stay Meetings, dates discussed:    Additional Comments:  Carles Collet, RN 05/29/2017, 8:50 AM

## 2017-05-29 NOTE — Progress Notes (Signed)
All discharge instructions reviewed in detail with pt and pt's husband with understanding verbalized by both. No questions or concerns prior to discharge. VSS. Discharged to home in stable condition with husband.

## 2017-05-30 ENCOUNTER — Encounter (HOSPITAL_COMMUNITY): Payer: Self-pay | Admitting: Orthopedic Surgery

## 2017-05-30 NOTE — Op Note (Signed)
[name], [name]              ACCOUNT NO.:  [id number]  MEDICAL RECORD NO.:  [id number]  LOCATION:                                 FACILITY:  PHYSICIAN:  Doran Heater. Veverly Fells, M.D.      DATE OF BIRTH:  DATE OF PROCEDURE:  05/27/2017 DATE OF DISCHARGE:                              OPERATIVE REPORT   PREOPERATIVE DIAGNOSIS:  Right shoulder severe osteoarthritis with significant rotator cuff dysfunction.  POSTOPERATIVE DIAGNOSIS:  Right shoulder severe osteoarthritis with significant rotator cuff dysfunction.  PROCEDURE PERFORMED:  Right shoulder reverse total shoulder arthroplasty using DePuy Delta Xtend prosthesis.  ATTENDING SURGEON:  Doran Heater. Veverly Fells, MD.  ASSISTANT:  Judith Part. Chabon, PA-C, who scrubbed during the entire procedure and necessary for satisfactory completion of surgery.  ANESTHESIA:  General anesthesia was used plus interscalene block.  ESTIMATED BLOOD LOSS:  Less than 200 mL.  FLUID REPLACEMENT:  1500 mL crystalloid.  INSTRUMENT COUNTS:  Correct.  COMPLICATIONS:  There were no complications.  PERIOPERATIVE ANTIBIOTICS:  Given.  INDICATIONS:  The patient is a 71 year old female with worsening right shoulder pain and dysfunction secondary to severe end-stage osteoarthritis.  The patient has large marginal osteophytes and a severe posterior glenoid erosion.  The patient has had progressive pain despite conservative management.  Desires operative treatment to restore function and eliminate pain.  The most reliable surgery that we felt like we could perform for her based on her terrible function and progressive wear in her joint was a reverse shoulder replacement.  We discussed the relative merits of the reverse and she did decide that she wanted move forward with reverse shoulder arthroplasty on that right shoulder.  Informed consent obtained.  DESCRIPTION OF PROCEDURE:  After an adequate level of anesthesia was achieved, the patient was positioned in  modified beach-chair position. Right shoulder correctly identified and sterilely prepped and draped in usual manner.  Time-out called.  We entered the shoulder using standard deltopectoral incision, starting at the coracoid process, extending down to the anterior humerus.  Dissection down through subcutaneous tissues using Bovie, identified cephalic vein, took it laterally with the deltoid and pectoralis taken medially, conjoint tendon identified and retracted medially.  We identified the biceps tendon and tenodesed in situ with 0 Vicryl figure-of-eight suture x2 into the pec tendon on the humerus.  We then went ahead and released the subscapularis off the lesser tuberosity subperiosteally with the Bovie.  We were able to tag the subscapularis for repair at the end with a modified W stitch with a #2 FiberWire suture.  We were able to then release the capsule.  We had excellent mobility of the subscapularis and felt like it would be able to be repaired.  We then released the inferior capsule off the humerus and then progressively externally rotated that presented the large osteophyte to [place] for removal with a rongeur.  We next extended the shoulder, divided the biceps tendon at the joint line, and released the remnant of the supraspinatus and infraspinatus tendons.  The tendon was thin and again that was evidence of significant rotator cuff dysfunction, so we went ahead and removed the supra and infraspinatus leaving teres minor maybe a little  bit of in from the back.  We went ahead at this point and extended the shoulder, delivering the humeral head out of the wound.  We entered the proximal humerus using a 6 mm reamer.  We only got up to a size 8 as she tapered quickly, so we then took our intramedullary size 8 guide and resected our head in 10 degrees of retroversion.  Once we did that, we removed our excess osteophytes at the joint line off the humerus and then we prepared the proximal  portion of the humerus with the metaphyseal reamer, reaming for the epi-1 right humerus.  Once we had that done, we impacted the trial #8 stem epi-1 right set on the 0 setting and placed in 10 degrees retroversion.  We then subluxed the humerus posteriorly, and did a 360-degree capsule labral excision, getting back to good bone.  She had a horrible amount of retroversion and remodeling of her of her glenoid.  She had a large anterior osteophyte as well, which were removed with osteotomes and a rongeur.  Once we got back to native bone that made it a little bit easier for [place] to see our retroversion was.  We went up placing our guide pin along the axis of the scapula and then reaming down the high side getting to where we had about 3/4 of the base plate supported superiorly, inferiorly, and anteriorly, but the posterior we gave up, but we were going to hopefully try to get a lock screw in the back to be able to support that.  We knew we had good bony support from really about 10 o'clock to down to 7:30 or 8 o'clock on this right shoulder. At this point, once we had reaming complete, we drilled our central peg hole.  We impacted the metaglene in position, we were pleased with the support.  We then placed a 42 screw inferiorly.  We only got a 24 proximally into the base of the coracoid then we were able to get a 42 posteriorly in bone.  The anterior one was not going to be in bone, so we left that free.  We had good support.  We locked all of our locking screws, placed our 38 standard glenosphere into position, carefully protected the axillary nerve using the subscapularis to protect it. Once we had that in place, we went ahead and trialed with a 38 +3 trial and felt like we had good soft tissue balancing.  We removed the trial components from the humerus.  Drill holes were placed.  #2 FiberWire suture for repair of the subscapularis back to the lesser tuberosity. We irrigated thoroughly  again and then used available bone graft in impaction grafting technique with the pore coat 8 body and an epi-1 right metaphysis set on the 0 setting and placed in 10 degrees of retroversion.  Once we had that impacted in position with the bone grafting medially, we were pleased with that, it was very stable.  We then selected the real 38 +3 poly, impacted that in position, reduced the shoulder.  We had good stability, nice tension on the conjoint, no gapping with inferior pole external rotation and at this point we went ahead and repaired the subscapularis anatomically back to the lesser tuberosity with suture through bone.  We had nice repair, did not reflect range of motion or tether in any way.  We irrigated again and closed deltopectoral incision with 0 Vicryl suture followed by 2-0 Vicryl for subcutaneous closure and  4-0 Monocryl for skin.  Steri-Strips applied followed by sterile dressing.  The patient tolerated the surgery well.     Doran Heater. Veverly Fells, M.D.   ______________________________ Doran Heater. Veverly Fells, M.D.   SRN/MEDQ  D:  05/27/2017  T:  05/27/2017  Job:  [id number]

## 2017-05-30 NOTE — Anesthesia Postprocedure Evaluation (Signed)
Anesthesia Post Note  Patient: [name]  Procedure(s) Performed: Procedure(s) (LRB): REVERSE RIGHT SHOULDER ARTHROPLASTY (Right)     Patient location during evaluation: PACU Anesthesia Type: General Level of consciousness: awake and alert and patient cooperative Pain management: pain level controlled Vital Signs Assessment: post-procedure vital signs reviewed and stable Respiratory status: spontaneous breathing and respiratory function stable Cardiovascular status: stable Anesthetic complications: no    Last Vitals:  Vitals:   05/29/17 1533 05/29/17 1810  BP: (!) 103/57 (!) 106/58  Pulse: 86 78  Resp: 18 18  Temp: 37.6 C 37.2 C    Last Pain:  Vitals:   05/29/17 1900  TempSrc:   PainSc: 4    Pain Goal: Patients Stated Pain Goal: 3 (05/27/17 0857)               [place]

## 2017-06-06 ENCOUNTER — Ambulatory Visit (INDEPENDENT_AMBULATORY_CARE_PROVIDER_SITE_OTHER): Payer: Medicare Other | Admitting: Podiatry

## 2017-06-06 DIAGNOSIS — M79676 Pain in unspecified toe(s): Secondary | ICD-10-CM

## 2017-06-06 DIAGNOSIS — B351 Tinea unguium: Secondary | ICD-10-CM

## 2017-06-06 DIAGNOSIS — K219 Gastro-esophageal reflux disease without esophagitis: Secondary | ICD-10-CM | POA: Insufficient documentation

## 2017-06-06 DIAGNOSIS — M1909 Primary osteoarthritis, other specified site: Secondary | ICD-10-CM | POA: Insufficient documentation

## 2017-06-06 DIAGNOSIS — M2041 Other hammer toe(s) (acquired), right foot: Secondary | ICD-10-CM

## 2017-06-06 DIAGNOSIS — E785 Hyperlipidemia, unspecified: Secondary | ICD-10-CM | POA: Insufficient documentation

## 2017-06-06 DIAGNOSIS — M1991 Primary osteoarthritis, unspecified site: Secondary | ICD-10-CM

## 2017-06-06 DIAGNOSIS — IMO0002 Reserved for concepts with insufficient information to code with codable children: Secondary | ICD-10-CM | POA: Insufficient documentation

## 2017-06-06 DIAGNOSIS — M2042 Other hammer toe(s) (acquired), left foot: Secondary | ICD-10-CM

## 2017-06-06 HISTORY — DX: Gastro-esophageal reflux disease without esophagitis: K21.9

## 2017-06-06 HISTORY — DX: Reserved for concepts with insufficient information to code with codable children: IMO0002

## 2017-06-06 HISTORY — DX: Primary osteoarthritis, other specified site: M19.09

## 2017-06-06 HISTORY — DX: Hyperlipidemia, unspecified: E78.5

## 2017-06-06 NOTE — Discharge Summary (Signed)
Physician Discharge Summary   Patient ID: [name] MRN: [id number] DOB/AGE: [birth year] 71 y.o.  Admit date: 05/27/2017 Discharge date: 05/29/17  Admission Diagnoses:  Active Problems:   S/P shoulder replacement, right   Discharge Diagnoses:  Same   Surgeries: Procedure(s): REVERSE RIGHT SHOULDER ARTHROPLASTY on 05/27/2017   Consultants: PT/OT  Discharged Condition: Stable  Hospital Course: [name] is an 71 y.o. female who was admitted 05/27/2017 with a chief complaint of right shoulder pain, and found to have a diagnosis of right shoulder cuff arthropathy.  They were brought to the operating room on 05/27/2017 and underwent the above named procedures.    The patient had an uncomplicated hospital course and was stable for discharge.  Recent vital signs:  Vitals:   05/29/17 1533 05/29/17 1810  BP: (!) 103/57 (!) 106/58  Pulse: 86 78  Resp: 18 18  Temp: 99.6 F (37.6 C) 98.9 F (37.2 C)    Recent laboratory studies:  Results for orders placed or performed during the hospital encounter of 05/27/17  Hemoglobin and hematocrit, blood  Result Value Ref Range   Hemoglobin 11.7 (L) 12.0 - 15.0 g/dL   HCT 35.9 (L) 36.0 - 62.7 %  Basic metabolic panel  Result Value Ref Range   Sodium 140 135 - 145 mmol/L   Potassium 3.4 (L) 3.5 - 5.1 mmol/L   Chloride 102 101 - 111 mmol/L   CO2 31 22 - 32 mmol/L   Glucose, Bld 138 (H) 65 - 99 mg/dL   BUN 11 6 - 20 mg/dL   Creatinine, Ser 0.68 0.44 - 1.00 mg/dL   Calcium 8.8 (L) 8.9 - 10.3 mg/dL   GFR calc non Af Amer >60 >60 mL/min   GFR calc Af Amer >60 >60 mL/min   Anion gap 7 5 - 15    Discharge Medications:   Allergies as of 05/29/2017      Reactions   Demerol [meperidine] Shortness Of Breath, Itching, Nausea Only   Depressed breathing and slurred speach   Hydrocodone Shortness Of Breath   Sulfa Antibiotics Hives, Rash   Tincture Of Benzoin [benzoin] Itching, Other (See Comments)   Burned skin   Dexamethasone  Other (See Comments)   Pins and needles, hot, flushed   Oxycodone Itching, Rash      Medication List    TAKE these medications   acetaminophen 500 MG tablet Commonly known as:  TYLENOL Take 1,000 mg by mouth every 8 (eight) hours as needed for moderate pain.   acetaminophen-codeine 300-30 MG tablet Commonly known as:  TYLENOL #3 Take 1 tablet by mouth daily as needed for moderate pain.   Biotin 10000 MCG Tabs Take 1 tablet by mouth at bedtime.   celecoxib 200 MG capsule Commonly known as:  CELEBREX Take 1 capsule by mouth daily.   CENTRUM SILVER PO Take 1 tablet by mouth daily.   cycloSPORINE 0.05 % ophthalmic emulsion Commonly known as:  RESTASIS Place 1 drop into both eyes 2 (two) times daily.   gabapentin 300 MG capsule Commonly known as:  NEURONTIN Take 300 mg by mouth 2 (two) times daily.   HYDROmorphone 2 MG tablet Commonly known as:  DILAUDID Take 0.5-1 tablets (1-2 mg total) by mouth every 6 (six) hours as needed for severe pain.   HYDROmorphone 2 MG tablet Commonly known as:  DILAUDID Take 0.5-1 tablets (1-2 mg total) by mouth every 6 (six) hours as needed for severe pain (for breakthrough only if Tylenol with codeine not helping).  lidocaine 5 % Commonly known as:  LIDODERM Place 1 patch onto the skin daily as needed (pain). Remove & Discard patch within 12 hours or as directed by MD   losartan-hydrochlorothiazide 50-12.5 MG tablet Commonly known as:  HYZAAR Take 1 tablet by mouth at bedtime.   methocarbamol 500 MG tablet Commonly known as:  ROBAXIN Take 1 tablet (500 mg total) by mouth 3 (three) times daily as needed.   ondansetron 4 MG tablet Commonly known as:  ZOFRAN Take 1 tablet (4 mg total) by mouth every 6 (six) hours as needed for nausea.   pantoprazole 40 MG tablet Commonly known as:  PROTONIX Take 40 mg by mouth daily.   simvastatin 40 MG tablet Commonly known as:  ZOCOR Take 1 tablet (40 mg total) by mouth every evening.     SYSTANE OP Place 1 drop into both eyes at bedtime.   VIACTIV PO Take 1 tablet by mouth every morning.   vitamin C 500 MG tablet Commonly known as:  ASCORBIC ACID Take 500 mg by mouth daily.       Diagnostic Studies: Dg Shoulder Right Port  Result Date: 05/27/2017 CLINICAL DATA:  Post right shoulder replacement EXAM: PORTABLE RIGHT SHOULDER COMPARISON:  Right shoulder radiographs -12/24/2016 FINDINGS: Post right total shoulder replacement. Alignment appears anatomic given solitary AP projection. No fracture or definitive dislocation. A minimal amount of subcutaneous emphysema is noted about the operative site. No radiopaque foreign body. Limited visualization of the adjacent thorax demonstrates right basilar opacities favored to represent atelectasis. IMPRESSION: Post right total shoulder replacement without evidence complication. Electronically Signed   By: Sandi Mariscal M.D.   On: 05/27/2017 15:07    Disposition: 01-Home or Self Care    Follow-up Information    Call Netta Cedars, MD.   Specialty:  Orthopedic Surgery Why:  [place] information: [address] [place] [address] [phone]            Signed: Ventura Bruns 06/06/2017, 1:17 PM

## 2017-06-06 NOTE — Progress Notes (Signed)
Complaint:  Visit Type: Patient returns to my office for continued preventative foot care services. Complaint: Patient states" my nails have grown long and thick and become painful to walk and wear shoes" Patient has prominent forefoot bones both feet. The patient presents for preventative foot care services. No changes to ROS  Podiatric Exam: Vascular: dorsalis pedis and posterior tibial pulses are palpable bilateral. Capillary return is immediate. Temperature gradient is WNL. Skin turgor WNL  Sensorium: Normal Semmes Weinstein monofilament test. Normal tactile sensation bilaterally. Nail Exam: Pt has thick disfigured discolored nails with subungual debris noted bilateral entire nail hallux through fifth toenails Ulcer Exam: There is no evidence of ulcer or pre-ulcerative changes or infection. Orthopedic Exam: Muscle tone and strength are WNL. No limitations in general ROM. No crepitus or effusions noted. Foot type and digits show no abnormalities. Bony prominences are unremarkable. Skin: No Porokeratosis. No infection or ulcers  Diagnosis:  Onychomycosis, , Pain in right toe, pain in left toes  Treatment & Plan Procedures and Treatment: Consent by patient was obtained for treatment procedures. The patient understood the discussion of treatment and procedures well. All questions were answered thoroughly reviewed. Debridement of mycotic and hypertrophic toenails, 1 through 5 bilateral and clearing of subungual debris. No ulceration, no infection noted. Told her to purchase padded insoles for relief of pain from bones. Return Visit-Office Procedure: Patient instructed to return to the office for a follow up visit 3 months for continued evaluation and treatment.    Gardiner Barefoot DPM

## 2017-09-05 ENCOUNTER — Ambulatory Visit: Payer: Medicare Other | Admitting: Podiatry

## 2017-09-15 ENCOUNTER — Ambulatory Visit: Payer: Medicare Other | Admitting: Podiatry

## 2017-09-15 ENCOUNTER — Encounter: Payer: Self-pay | Admitting: Podiatry

## 2017-09-15 DIAGNOSIS — B351 Tinea unguium: Secondary | ICD-10-CM

## 2017-09-15 DIAGNOSIS — M2042 Other hammer toe(s) (acquired), left foot: Secondary | ICD-10-CM

## 2017-09-15 DIAGNOSIS — M79676 Pain in unspecified toe(s): Secondary | ICD-10-CM | POA: Diagnosis not present

## 2017-09-15 DIAGNOSIS — M2011 Hallux valgus (acquired), right foot: Secondary | ICD-10-CM

## 2017-09-15 DIAGNOSIS — M2041 Other hammer toe(s) (acquired), right foot: Secondary | ICD-10-CM

## 2017-09-15 NOTE — Progress Notes (Signed)
Complaint:  Visit Type: Patient returns to my office for continued preventative foot care services. Complaint: Patient states" my nails have grown long and thick and become painful to walk and wear shoes" Patient has prominent forefoot bones both feet. The patient presents for preventative foot care services. No changes to ROS  Podiatric Exam: Vascular: dorsalis pedis and posterior tibial pulses are palpable bilateral. Capillary return is immediate. Temperature gradient is WNL. Skin turgor WNL  Sensorium: Normal Semmes Weinstein monofilament test. Normal tactile sensation bilaterally. Nail Exam: Pt has thick disfigured discolored nails with subungual debris noted bilateral entire nail hallux through fifth toenails Ulcer Exam: There is no evidence of ulcer or pre-ulcerative changes or infection. Orthopedic Exam: Muscle tone and strength are WNL. No limitations in general ROM. No crepitus or effusions noted. 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 No infection or ulcers  Diagnosis:  Onychomycosis, , Pain in right toe, pain in left toes  Treatment & Plan Procedures and Treatment: Consent by patient was obtained for treatment procedures. The patient understood the discussion of treatment and procedures well. All questions were answered thoroughly reviewed. Debridement of mycotic and hypertrophic toenails, 1 through 5 bilateral and clearing of subungual debris. No ulceration, no infection noted. Told her to purchase padded insoles. Return Visit-Office Procedure: Patient instructed to return to the office for a follow up visit 3 months for continued evaluation and treatment.    Gardiner Barefoot DPM

## 2017-12-19 ENCOUNTER — Ambulatory Visit: Payer: Medicare Other | Admitting: Podiatry

## 2018-01-09 ENCOUNTER — Encounter: Payer: Self-pay | Admitting: Podiatry

## 2018-01-09 ENCOUNTER — Ambulatory Visit: Payer: Medicare Other | Admitting: Podiatry

## 2018-01-09 DIAGNOSIS — M2041 Other hammer toe(s) (acquired), right foot: Secondary | ICD-10-CM

## 2018-01-09 DIAGNOSIS — M79676 Pain in unspecified toe(s): Secondary | ICD-10-CM

## 2018-01-09 DIAGNOSIS — M2042 Other hammer toe(s) (acquired), left foot: Secondary | ICD-10-CM

## 2018-01-09 DIAGNOSIS — B351 Tinea unguium: Secondary | ICD-10-CM | POA: Diagnosis not present

## 2018-01-09 NOTE — Progress Notes (Signed)
Complaint:  Visit Type: Patient returns to my office for continued preventative foot care services. Complaint: Patient states" my nails have grown long and thick and become painful to walk and wear shoes" Patient has prominent forefoot bones both feet. The patient presents for preventative foot care services. No changes to ROS  Podiatric Exam: Vascular: dorsalis pedis and posterior tibial pulses are palpable bilateral. Capillary return is immediate. Temperature gradient is WNL. Skin turgor WNL  Sensorium: Normal Semmes Weinstein monofilament test. Normal tactile sensation bilaterally. Nail Exam: Pt has thick disfigured discolored nails with subungual debris noted bilateral entire nail hallux through fifth toenails Ulcer Exam: There is no evidence of ulcer or pre-ulcerative changes or infection. Orthopedic Exam: Muscle tone and strength are WNL. No limitations in general ROM. No crepitus or effusions noted. 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 No infection or ulcers  Diagnosis:  Onychomycosis, , Pain in right toe, pain in left toes  Treatment & Plan Procedures and Treatment: Consent by patient was obtained for treatment procedures. The patient understood the discussion of treatment and procedures well. All questions were answered thoroughly reviewed. Debridement of mycotic and hypertrophic toenails, 1 through 5 bilateral and clearing of subungual debris. No ulceration, no infection noted.  Discussed hammer toe surgery with patient. Return Visit-Office Procedure: Patient instructed to return to the office for a follow up visit 3 months for continued evaluation and treatment.    Gardiner Barefoot DPM

## 2018-01-30 ENCOUNTER — Ambulatory Visit: Payer: Medicare Other | Admitting: Obstetrics & Gynecology

## 2018-01-30 ENCOUNTER — Encounter: Payer: Self-pay | Admitting: Obstetrics & Gynecology

## 2018-01-30 VITALS — BP 130/80 | Ht 59.5 in | Wt 151.0 lb

## 2018-01-30 DIAGNOSIS — Z13820 Encounter for screening for osteoporosis: Secondary | ICD-10-CM

## 2018-01-30 DIAGNOSIS — Z780 Asymptomatic menopausal state: Secondary | ICD-10-CM | POA: Diagnosis not present

## 2018-01-30 DIAGNOSIS — Z01411 Encounter for gynecological examination (general) (routine) with abnormal findings: Secondary | ICD-10-CM

## 2018-01-30 DIAGNOSIS — C50912 Malignant neoplasm of unspecified site of left female breast: Secondary | ICD-10-CM | POA: Diagnosis not present

## 2018-01-30 DIAGNOSIS — Z124 Encounter for screening for malignant neoplasm of cervix: Secondary | ICD-10-CM

## 2018-01-30 NOTE — Progress Notes (Signed)
[name] [birth year] [id number]   History:    72 y.o. G2P2L2 Married.  Adopted daughter with Stage IV Ovarian Ca at 49 yo.  RP:  Established patient presenting for annual gyn exam   HPI: Menopause, well on no hormone replacement therapy.  No postmenopausal bleeding.  History of left breast cancer.  Status post left breast lumpectomy in 1996.  No pelvic pain.  Rarely sexually active as her husband has erectile dysfunction.  Urine and bowel movements normal.  Body mass index 29.99.  Physically active.  Health labs with family physician.  Since last visit with me 07/2015, patient hadLeft hip replacement in 2016, Neck fusion 10/2015, Cataract surgery 2017, Rt reverse shoulder replacement 2018.  Past medical history,surgical history, family history and social history were all reviewed and documented in the EPIC chart.  Gynecologic History No LMP recorded. Patient is postmenopausal. Contraception: post menopausal status Last Pap: 07/2015. Results were: Negative, HPV HR neg Last mammogram: 04/2017. Results were: Negative Bone Density: 05/2016 Limit of normal with T-Score -1.0 Colonoscopy: 2015  Obstetric History OB History  Gravida Para Term Preterm AB Living  2 2       2   SAB TAB Ectopic Multiple Live Births               # Outcome Date GA Lbr Len/2nd Weight Sex Delivery Anes PTL Lv  2 Para           1 Para              ROS: A ROS was performed and pertinent positives and negatives are included in the history.  GENERAL: No fevers or chills. HEENT: No change in vision, no earache, sore throat or sinus congestion. NECK: No pain or stiffness. CARDIOVASCULAR: No chest pain or pressure. No palpitations. PULMONARY: No shortness of breath, cough or wheeze. GASTROINTESTINAL: No abdominal pain, nausea, vomiting or diarrhea, melena or bright red blood per rectum. GENITOURINARY: No urinary frequency, urgency, hesitancy or dysuria. MUSCULOSKELETAL: No joint or muscle pain, no back pain, no  recent trauma. DERMATOLOGIC: No rash, no itching, no lesions. ENDOCRINE: No polyuria, polydipsia, no heat or cold intolerance. No recent change in weight. HEMATOLOGICAL: No anemia or easy bruising or bleeding. NEUROLOGIC: No headache, seizures, numbness, tingling or weakness. PSYCHIATRIC: No depression, no loss of interest in normal activity or change in sleep pattern.     Exam:   Ht 4' 11.5" (1.511 m)   Wt 151 lb (68.5 kg)   BMI 29.99 kg/m   Body mass index is 29.99 kg/m.  General appearance : Well developed well nourished female. No acute distress HEENT: Eyes: no retinal hemorrhage or exudates,  Neck supple, trachea midline, no carotid bruits, no thyroidmegaly Lungs: Clear to auscultation, no rhonchi or wheezes, or rib retractions  Heart: Regular rate and rhythm, no murmurs or gallops Breast:Examined in sitting and supine position were symmetrical in appearance, no palpable masses or tenderness,  no skin retraction, no nipple inversion, no nipple discharge, no skin discoloration, no axillary or supraclavicular lymphadenopathy Abdomen: no palpable masses or tenderness, no rebound or guarding Extremities: no edema or skin discoloration or tenderness  Pelvic: Vulva: Normal             Vagina: No gross lesions or discharge  Cervix: No gross lesions or discharge.  Pap reflex done  Uterus  AV, normal size, shape and consistency, non-tender and mobile  Adnexa  Without masses or tenderness  Anus: Normal   Assessment/Plan:  71  y.o. female for annual exam   1. Encounter for gynecological examination with abnormal finding Normal gynecologic exam.  Pap reflex done.  Breast exam normal.  Will schedule screening mammogram in July 2019.  Health labs with family physician.  Colonoscopy in 2015.  2. Menopause present Well on no hormone replacement therapy.  No postmenopausal bleeding.  3. Screening for osteoporosis Vitamin D supplements, calcium rich nutrition and regular weightbearing  physical activity recommended.  Patient will follow up for bone density here in July 2019. - DG Bone Density; Future  4. Malignant neoplasm of left female breast, unspecified estrogen receptor status, unspecified site of breast [facility]) Next screening mammogram July 2019.  Princess Bruins MD, 2:10 PM 01/30/2018

## 2018-01-31 LAB — PAP IG W/ RFLX HPV ASCU

## 2018-02-01 ENCOUNTER — Other Ambulatory Visit: Payer: Self-pay | Admitting: Gynecology

## 2018-02-01 DIAGNOSIS — Z780 Asymptomatic menopausal state: Secondary | ICD-10-CM

## 2018-02-03 ENCOUNTER — Encounter: Payer: Self-pay | Admitting: Obstetrics & Gynecology

## 2018-02-03 NOTE — Patient Instructions (Signed)
1. Encounter for gynecological examination with abnormal finding Normal gynecologic exam.  Pap reflex done.  Breast exam normal.  Will schedule screening mammogram in July 2019.  Health labs with family physician.  Colonoscopy in 2015.  2. Menopause present Well on no hormone replacement therapy.  No postmenopausal bleeding.  3. Screening for osteoporosis Vitamin D supplements, calcium rich nutrition and regular weightbearing physical activity recommended.  Patient will follow up for bone density here in July 2019. - DG Bone Density; Future  4. Malignant neoplasm of left female breast, unspecified estrogen receptor status, unspecified site of breast [facility]) Next screening mammogram July 2019.  [name], it was a pleasure seeing you today!  I will inform you of your results as soon as they are available.   Health Maintenance for Postmenopausal Women Menopause is a normal process in which your reproductive ability comes to an end. This process happens gradually over a span of months to years, usually between the ages of 1 and 87. Menopause is complete when you have missed 12 consecutive menstrual periods. It is important to talk with your health care provider about some of the most common conditions that affect postmenopausal women, such as heart disease, cancer, and bone loss (osteoporosis). Adopting a healthy lifestyle and getting preventive care can help to promote your health and wellness. Those actions can also lower your chances of developing some of these common conditions. What should I know about menopause? During menopause, you may experience a number of symptoms, such as:  Moderate-to-severe hot flashes.  Night sweats.  Decrease in sex drive.  Mood swings.  Headaches.  Tiredness.  Irritability.  Memory problems.  Insomnia.  Choosing to treat or not to treat menopausal changes is an individual decision that you make with your health care provider. What should I know about  hormone replacement therapy and supplements? Hormone therapy products are effective for treating symptoms that are associated with menopause, such as hot flashes and night sweats. Hormone replacement carries certain risks, especially as you become older. If you are thinking about using estrogen or estrogen with progestin treatments, discuss the benefits and risks with your health care provider. What should I know about heart disease and stroke? Heart disease, heart attack, and stroke become more likely as you age. This may be due, in part, to the hormonal changes that your body experiences during menopause. These can affect how your body processes dietary fats, triglycerides, and cholesterol. Heart attack and stroke are both medical emergencies. There are many things that you can do to help prevent heart disease and stroke:  Have your blood pressure checked at least every 1-2 years. High blood pressure causes heart disease and increases the risk of stroke.  If you are 56-50 years old, ask your health care provider if you should take aspirin to prevent a heart attack or a stroke.  Do not use any tobacco products, including cigarettes, chewing tobacco, or electronic cigarettes. If you need help quitting, ask your health care provider.  It is important to eat a healthy diet and maintain a healthy weight. ? Be sure to include plenty of vegetables, fruits, low-fat dairy products, and lean protein. ? Avoid eating foods that are high in solid fats, added sugars, or salt (sodium).  Get regular exercise. This is one of the most important things that you can do for your health. ? Try to exercise for at least 150 minutes each week. The type of exercise that you do should increase your heart rate and  make you sweat. This is known as moderate-intensity exercise. ? Try to do strengthening exercises at least twice each week. Do these in addition to the moderate-intensity exercise.  Know your numbers.Ask your  health care provider to check your cholesterol and your blood glucose. Continue to have your blood tested as directed by your health care provider.  What should I know about cancer screening? There are several types of cancer. Take the following steps to reduce your risk and to catch any cancer development as early as possible. Breast Cancer  Practice breast self-awareness. ? This means understanding how your breasts normally appear and feel. ? It also means doing regular breast self-exams. Let your health care provider know about any changes, no matter how small.  If you are 59 or older, have a clinician do a breast exam (clinical breast exam or CBE) every year. Depending on your age, family history, and medical history, it may be recommended that you also have a yearly breast X-ray (mammogram).  If you have a family history of breast cancer, talk with your health care provider about genetic screening.  If you are at high risk for breast cancer, talk with your health care provider about having an MRI and a mammogram every year.  Breast cancer (BRCA) gene test is recommended for women who have family members with BRCA-related cancers. Results of the assessment will determine the need for genetic counseling and BRCA1 and for BRCA2 testing. BRCA-related cancers include these types: ? Breast. This occurs in males or females. ? Ovarian. ? Tubal. This may also be called fallopian tube cancer. ? Cancer of the abdominal or pelvic lining (peritoneal cancer). ? Prostate. ? Pancreatic.  Cervical, Uterine, and Ovarian Cancer Your health care provider may recommend that you be screened regularly for cancer of the pelvic organs. These include your ovaries, uterus, and vagina. This screening involves a pelvic exam, which includes checking for microscopic changes to the surface of your cervix (Pap test).  For women ages 21-65, health care providers may recommend a pelvic exam and a Pap test every three  years. For women ages 63-65, they may recommend the Pap test and pelvic exam, combined with testing for human papilloma virus (HPV), every five years. Some types of HPV increase your risk of cervical cancer. Testing for HPV may also be done on women of any age who have unclear Pap test results.  Other health care providers may not recommend any screening for nonpregnant women who are considered low risk for pelvic cancer and have no symptoms. Ask your health care provider if a screening pelvic exam is right for you.  If you have had past treatment for cervical cancer or a condition that could lead to cancer, you need Pap tests and screening for cancer for at least 20 years after your treatment. If Pap tests have been discontinued for you, your risk factors (such as having a new sexual partner) need to be reassessed to determine if you should start having screenings again. Some women have medical problems that increase the chance of getting cervical cancer. In these cases, your health care provider may recommend that you have screening and Pap tests more often.  If you have a family history of uterine cancer or ovarian cancer, talk with your health care provider about genetic screening.  If you have vaginal bleeding after reaching menopause, tell your health care provider.  There are currently no reliable tests available to screen for ovarian cancer.  Lung Cancer Lung cancer  screening is recommended for adults 69-65 years old who are at high risk for lung cancer because of a history of smoking. A yearly low-dose CT scan of the lungs is recommended if you:  Currently smoke.  Have a history of at least 30 pack-years of smoking and you currently smoke or have quit within the past 15 years. A pack-year is smoking an average of one pack of cigarettes per day for one year.  Yearly screening should:  Continue until it has been 15 years since you quit.  Stop if you develop a health problem that would  prevent you from having lung cancer treatment.  Colorectal Cancer  This type of cancer can be detected and can often be prevented.  Routine colorectal cancer screening usually begins at age 52 and continues through age 19.  If you have risk factors for colon cancer, your health care provider may recommend that you be screened at an earlier age.  If you have a family history of colorectal cancer, talk with your health care provider about genetic screening.  Your health care provider may also recommend using home test kits to check for hidden blood in your stool.  A small camera at the end of a tube can be used to examine your colon directly (sigmoidoscopy or colonoscopy). This is done to check for the earliest forms of colorectal cancer.  Direct examination of the colon should be repeated every 5-10 years until age 56. However, if early forms of precancerous polyps or small growths are found or if you have a family history or genetic risk for colorectal cancer, you may need to be screened more often.  Skin Cancer  Check your skin from head to toe regularly.  Monitor any moles. Be sure to tell your health care provider: ? About any new moles or changes in moles, especially if there is a change in a mole's shape or color. ? If you have a mole that is larger than the size of a pencil eraser.  If any of your family members has a history of skin cancer, especially at a young age, talk with your health care provider about genetic screening.  Always use sunscreen. Apply sunscreen liberally and repeatedly throughout the day.  Whenever you are outside, protect yourself by wearing long sleeves, pants, a wide-brimmed hat, and sunglasses.  What should I know about osteoporosis? Osteoporosis is a condition in which bone destruction happens more quickly than new bone creation. After menopause, you may be at an increased risk for osteoporosis. To help prevent osteoporosis or the bone fractures that  can happen because of osteoporosis, the following is recommended:  If you are 62-61 years old, get at least 1,000 mg of calcium and at least 600 mg of vitamin D per day.  If you are older than age 23 but younger than age 58, get at least 1,200 mg of calcium and at least 600 mg of vitamin D per day.  If you are older than age 25, get at least 1,200 mg of calcium and at least 800 mg of vitamin D per day.  Smoking and excessive alcohol intake increase the risk of osteoporosis. Eat foods that are rich in calcium and vitamin D, and do weight-bearing exercises several times each week as directed by your health care provider. What should I know about how menopause affects my mental health? Depression may occur at any age, but it is more common as you become older. Common symptoms of depression include:  Low  or sad mood.  Changes in sleep patterns.  Changes in appetite or eating patterns.  Feeling an overall lack of motivation or enjoyment of activities that you previously enjoyed.  Frequent crying spells.  Talk with your health care provider if you think that you are experiencing depression. What should I know about immunizations? It is important that you get and maintain your immunizations. These include:  Tetanus, diphtheria, and pertussis (Tdap) booster vaccine.  Influenza every year before the flu season begins.  Pneumonia vaccine.  Shingles vaccine.  Your health care provider may also recommend other immunizations. This information is not intended to replace advice given to you by your health care provider. Make sure you discuss any questions you have with your health care provider. Document Released: 12/17/2005 Document Revised: 05/14/2016 Document Reviewed: 07/29/2015 Elsevier Interactive Patient Education  2018 [employer].

## 2018-02-06 DIAGNOSIS — M8580 Other specified disorders of bone density and structure, unspecified site: Secondary | ICD-10-CM

## 2018-02-06 HISTORY — DX: Other specified disorders of bone density and structure, unspecified site: M85.80

## 2018-02-08 ENCOUNTER — Emergency Department (HOSPITAL_COMMUNITY): Payer: Medicare Other

## 2018-02-08 ENCOUNTER — Emergency Department (HOSPITAL_COMMUNITY)
Admission: EM | Admit: 2018-02-08 | Discharge: 2018-02-08 | Disposition: A | Discharge status: Home / Self Care | Payer: Medicare Other | Attending: Emergency Medicine | Admitting: Emergency Medicine

## 2018-02-08 ENCOUNTER — Encounter (HOSPITAL_COMMUNITY): Payer: Self-pay

## 2018-02-08 DIAGNOSIS — Y9301 Activity, walking, marching and hiking: Secondary | ICD-10-CM | POA: Insufficient documentation

## 2018-02-08 DIAGNOSIS — Y999 Unspecified external cause status: Secondary | ICD-10-CM | POA: Diagnosis not present

## 2018-02-08 DIAGNOSIS — I10 Essential (primary) hypertension: Secondary | ICD-10-CM | POA: Insufficient documentation

## 2018-02-08 DIAGNOSIS — S0990XA Unspecified injury of head, initial encounter: Secondary | ICD-10-CM | POA: Insufficient documentation

## 2018-02-08 DIAGNOSIS — W01198A Fall on same level from slipping, tripping and stumbling with subsequent striking against other object, initial encounter: Secondary | ICD-10-CM | POA: Insufficient documentation

## 2018-02-08 DIAGNOSIS — Y929 Unspecified place or not applicable: Secondary | ICD-10-CM | POA: Insufficient documentation

## 2018-02-08 DIAGNOSIS — T148XXA Other injury of unspecified body region, initial encounter: Secondary | ICD-10-CM

## 2018-02-08 DIAGNOSIS — Z79899 Other long term (current) drug therapy: Secondary | ICD-10-CM | POA: Insufficient documentation

## 2018-02-08 LAB — CBC WITH DIFFERENTIAL/PLATELET
BASOS ABS: 0 10*3/uL (ref 0.0–0.1)
BASOS PCT: 0 %
EOS ABS: 0.2 10*3/uL (ref 0.0–0.7)
Eosinophils Relative: 5 %
HEMATOCRIT: 45 % (ref 36.0–46.0)
Hemoglobin: 14.6 g/dL (ref 12.0–15.0)
Lymphocytes Relative: 36 %
Lymphs Abs: 1.8 10*3/uL (ref 0.7–4.0)
MCH: 29.4 pg (ref 26.0–34.0)
MCHC: 32.4 g/dL (ref 30.0–36.0)
MCV: 90.7 fL (ref 78.0–100.0)
MONOS PCT: 7 %
Monocytes Absolute: 0.3 10*3/uL (ref 0.1–1.0)
NEUTROS ABS: 2.6 10*3/uL (ref 1.7–7.7)
NEUTROS PCT: 52 %
Platelets: 208 10*3/uL (ref 150–400)
RBC: 4.96 MIL/uL (ref 3.87–5.11)
RDW: 12.9 % (ref 11.5–15.5)
WBC: 4.9 10*3/uL (ref 4.0–10.5)

## 2018-02-08 LAB — BASIC METABOLIC PANEL
ANION GAP: 10 (ref 5–15)
BUN: 13 mg/dL (ref 6–20)
CALCIUM: 9.7 mg/dL (ref 8.9–10.3)
CO2: 29 mmol/L (ref 22–32)
CREATININE: 0.61 mg/dL (ref 0.44–1.00)
Chloride: 101 mmol/L (ref 101–111)
GFR calc Af Amer: >60 mL/min (ref >60)
Glucose, Bld: 86 mg/dL (ref 65–99)
Potassium: 3.7 mmol/L (ref 3.5–5.1)
SODIUM: 140 mmol/L (ref 135–145)

## 2018-02-08 MED ORDER — FENTANYL CITRATE (PF) 100 MCG/2ML IJ SOLN: 50.0000 ug | Freq: Once | INTRAMUSCULAR | Status: DC

## 2018-02-08 MED ORDER — ONDANSETRON HCL 4 MG PO TABS
4.0000 mg | ORAL_TABLET | Freq: Once | ORAL | Status: AC
  Administered 2018-02-08: 4 mg via ORAL
  Filled 2018-02-08: qty 1

## 2018-02-08 MED ORDER — ONDANSETRON HCL 4 MG PO TABS: 4.0000 mg | ORAL_TABLET | Freq: Four times a day (QID) | ORAL | 0 refills | Status: DC

## 2018-02-08 MED ORDER — ONDANSETRON HCL 4 MG/2ML IJ SOLN
4.0000 mg | Freq: Once | INTRAMUSCULAR | Status: AC
  Administered 2018-02-08: 4 mg via INTRAVENOUS
  Filled 2018-02-08: qty 2

## 2018-02-08 MED ORDER — FENTANYL CITRATE (PF) 100 MCG/2ML IJ SOLN
50.0000 ug | Freq: Once | INTRAMUSCULAR | Status: AC
  Administered 2018-02-08: 50 ug via INTRAVENOUS
  Filled 2018-02-08: qty 2

## 2018-02-08 NOTE — ED Notes (Signed)
Pt vomited. Reports she feels better.

## 2018-02-08 NOTE — ED Triage Notes (Signed)
Pt presents with R sided neck pain and hematoma to R forehead after falling at home.  Pt reports she was running, tripped on carpet and fell forward into a corner of table.  Pt denies LOC, reports "triple vision" after falling but denies at present.

## 2018-02-08 NOTE — ED Notes (Signed)
Pt reports nausea with pain. Will ask for med order.

## 2018-02-08 NOTE — ED Provider Notes (Signed)
[facility]ERGENCY DEPARTMENT Provider Note   CSN: [id number] Arrival date & time: 02/08/18  1337     History   Chief Complaint Chief Complaint  Patient presents with  . Fall    HPI [name] is a 72 y.o. female who presents for evaluation after mechanical fall that occurred 30 minutes prior to ED arrival.  Patient reports that she was walking around in her house, when she tripped and fell over some carpet, causing her to fall forward.  Patient reports that when she fell, she hit her face against the cabinet and hit her head on the counter corner before falling to the ground.  Patient reports that she does not think she lost conscious but says she does not remember the entire event.  Patient denies any preceding chest pain, dizziness prior to falling.  Patient reports that initially, she had some triple vision but states that since ED arrival, that has improved.  On ED arrival, patient is complaining of right-sided headache, neck pain, facial pain.  She reports that she is not currently on any blood thinners.  Patient denies any fever, back pain, chest pain, difficulty breathing, nausea/vomiting, numbness/weakness of her arms or legs.  The history is provided by the patient.    Past Medical History:  Diagnosis Date  . Arthritis   . Arthropathy of right shoulder   . Breast cancer ([place])   . Breast cancer ([place])   . DJD (degenerative joint disease)   . Family history of adverse reaction to anesthesia    " My sister had 2 migrine strokes waking up from her back procedure."  . GERD (gastroesophageal reflux disease)   . Headache(784.0)    migraines  . History of cancer chemotherapy 1996   ALSO RADIATION - BREAST CANCER  . History of skin cancer   . Hx-TIA (transient ischemic attack)    X2  . Hyperlipidemia   . Hypertension   . Personal history of chemotherapy 1997  . Personal history of radiation therapy 1996  . PONV (postoperative nausea and vomiting)      desat during procedure on 10/2015 led oxygen use for 1 month post-op  . Squamous cell carcinoma   . Wears glasses     Patient Active Problem List   Diagnosis Date Noted  . DDD (degenerative disc disease) 06/06/2017  . Acid reflux 06/06/2017  . HLD (hyperlipidemia) 06/06/2017  . 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 S/P shoulder replacement, right 05/27/2017  . Cervical radiculopathy 10/28/2015  . Spondylolisthesis of cervical region 10/28/2015  . Obesity 05/21/2015  . Acute respiratory failure ([place]) 05/20/2015  . 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 Chest pain, moderate coronary artery risk 04/22/2015  . Abdominal pain 03/06/2014  . Malignant neoplasm of female breast ([place]) 04/23/2008  . Hypercholesterolemia without hypertriglyceridemia 04/23/2008  . Benign essential HTN 04/23/2008  . Temporary cerebral vascular dysfunction 04/23/2008  . ARTHRITIS 04/23/2008  . 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 DEEP VENOUS THROMBOPHLEBITIS, HX OF 04/23/2008  . HEADACHE, CHRONIC, HX OF 04/23/2008  . DIVERTICULOSIS, COLON 12/15/2006  . BENIGN NEOPLASM OF ADRENAL GLAND 10/25/2006    Past Surgical History:  Procedure Laterality Date  . back fusion  2005, 2008, 2011   T4-S1  . BREAST BIOPSY Left 1996  . BREAST LUMPECTOMY Left 1996  . BUNIONECTOMY Bilateral   . CATARACT EXTRACTION W/ INTRAOCULAR LENS  IMPLANT, BILATERAL    . CHOLECYSTECTOMY    .  COLONOSCOPY    . HYSTEROSCOPY W/D&C N/A 08/15/2013   Procedure: DILATATION AND CURETTAGE /HYSTEROSCOPY WITH RESECTION;  Surgeon: Princess Bruins, MD;  Location: [facility];  Service: Gynecology;  Laterality: N/A;  . MULTIPLE TOOTH EXTRACTIONS    . NM MYOVIEW LTD  12/13/2013   Low risk study, no areas of significant ischemia. Mild apical, distal anterolateral wall perfusion defect is mostly fixed and likely represents attenuation artifact. EF 58%.  Marland Kitchen REVERSE  SHOULDER ARTHROPLASTY Right 05/27/2017   Procedure: REVERSE RIGHT SHOULDER ARTHROPLASTY;  Surgeon: Netta Cedars, MD;  Location: [facility];  Service: Orthopedics;  Laterality: Right;  . ROTATOR CUFF REPAIR Right    x 2  . SHOULDER ARTHROSCOPY Left   . THUMB ARTHROSCOPY Right    left, cyst removal  . TONSILLECTOMY     x 4, kept growing back  . TONSILLECTOMY    . TOTAL HIP ARTHROPLASTY Bilateral 1987, [place]   right  x 2  . TOTAL HIP REVISION Left 04/22/2015   Procedure: LEFT HIP REVISION ACETABULAR COMPONENT;  Surgeon: Rod Can, MD;  Location: [facility];  Service: Orthopedics;  Laterality: Left;     OB History    Gravida  2   Para  2   Term      Preterm      AB      Living  2     SAB      TAB      Ectopic      Multiple      Live Births               Home Medications    Prior to Admission medications   Medication Sig Start Date End Date Taking? Authorizing Provider  acetaminophen (TYLENOL) 500 MG tablet Take 1,000 mg by mouth every 8 (eight) hours as needed for moderate pain.    [provider]  acetaminophen-codeine (TYLENOL #3) 300-30 MG tablet Take 1 tablet by mouth daily as needed for moderate pain.    [provider]  Biotin 10000 MCG TABS Take 1 tablet by mouth at bedtime.    [provider]  Calcium-Vitamin D-Vitamin K (VIACTIV PO) Take 1 tablet by mouth every morning.     [provider]  celecoxib (CELEBREX) 200 MG capsule Take 1 capsule by mouth daily. 07/21/15   [provider]  cycloSPORINE (RESTASIS) 0.05 % ophthalmic emulsion Place 1 drop into both eyes 2 (two) times daily.    [provider]  gabapentin (NEURONTIN) 300 MG capsule Take 300 mg by mouth 2 (two) times daily.  09/13/16   [provider]  losartan-hydrochlorothiazide (HYZAAR) 50-12.5 MG per tablet Take 1 tablet by mouth at bedtime.     [provider]  Multiple Vitamins-Minerals (CENTRUM SILVER PO) Take 1 tablet by  mouth daily.    [provider]  ondansetron (ZOFRAN) 4 MG tablet Take 1 tablet (4 mg total) by mouth every 6 (six) hours. 02/08/18   Volanda Napoleon, PA-C  pantoprazole (PROTONIX) 40 MG tablet Take 40 mg by mouth daily.    [provider]  simvastatin (ZOCOR) 40 MG tablet Take 1 tablet (40 mg total) by mouth every evening. 08/15/13   Princess Bruins, MD  vitamin C (ASCORBIC ACID) 500 MG tablet Take 500 mg by mouth daily.    [provider]    Family History Family History  Problem Relation Age of Onset  . Breast cancer Mother 36  . Hypertension Father   .  Heart attack Father   . Hyperlipidemia Father   . Colon cancer Father   . Breast cancer Maternal Grandmother 32  . Stroke Maternal Grandfather   . Heart attack Paternal Grandmother   . Cervical cancer Paternal Grandmother   . Heart attack Paternal Grandfather   . Hypertension Sister     Social History Social History   Tobacco Use  . Smoking status: Never Smoker  . Smokeless tobacco: Never Used  Substance Use Topics  . Alcohol use: Yes    Alcohol/week: 0.0 oz    Comment: rare   . Drug use: No     Allergies   Demerol [meperidine]; Hydrocodone; Sulfa antibiotics; Tincture of benzoin [benzoin]; Dexamethasone; and Oxycodone   Review of Systems Review of Systems  Constitutional: Negative for fever.  Eyes: Positive for visual disturbance (resolved).  Respiratory: Negative for cough and shortness of breath.   Cardiovascular: Negative for chest pain.  Gastrointestinal: Negative for abdominal pain, nausea and vomiting.  Genitourinary: Negative for dysuria and hematuria.  Musculoskeletal: Positive for neck pain. Negative for back pain.  Neurological: Positive for headaches. Negative for weakness and numbness.  All other systems reviewed and are negative.    Physical Exam Updated Vital Signs BP (!) 158/83   Pulse 70   Temp 97.7 F (36.5 C) (Oral)   Resp 16   SpO2 99%   Physical Exam    Constitutional: She is oriented to person, place, and time. She appears well-developed and well-nourished.  HENT:  Head: Normocephalic and atraumatic.    Mouth/Throat: Oropharynx is clear and moist and mucous membranes are normal.  Elevation/depression and lateral movement of mandible intact without any difficulty.  Dentition appears intact.  No lip laceration noted.  Eyes: Pupils are equal, round, and reactive to light. Conjunctivae, EOM and lids are normal.  Neck: Full passive range of motion without pain.  Tenderness palpation to the midline cervical region at approximately the C4-C5 level.  No deformity or crepitus noted.  Limited range of motion secondary to patient's pain.  Cardiovascular: Normal rate, regular rhythm, normal heart sounds and normal pulses. Exam reveals no gallop and no friction rub.  No murmur heard. Pulmonary/Chest: Effort normal and breath sounds normal.  Abdominal: Soft. Normal appearance. There is no tenderness. There is no rigidity and no guarding.  Musculoskeletal: Normal range of motion.       Thoracic back: She exhibits no tenderness.       Lumbar back: She exhibits no tenderness.  Neurological: She is alert and oriented to person, place, and time.  Cranial nerves III-XII intact Follows commands, Moves all extremities  5/5 strength to BUE and BLE  Sensation intact throughout all major nerve distributions Normal finger to nose. No dysdiadochokinesia. No pronator drift. No gait abnormalities  No slurred speech. No facial droop.   Skin: Skin is warm and dry. Capillary refill takes less than 2 seconds.  Psychiatric: She has a normal mood and affect. Her speech is normal.  Nursing note and vitals reviewed.    ED Treatments / Results  Labs (all labs ordered are listed, but only abnormal results are displayed) Labs Reviewed  BASIC METABOLIC PANEL  CBC WITH DIFFERENTIAL/PLATELET    EKG None  Radiology Ct Head Wo Contrast  Result Date:  02/08/2018 CLINICAL DATA:  72 year old female status post fall at home into the corner of a table. Right forehead hematoma, right side neck pain. EXAM: CT HEAD WITHOUT CONTRAST CT MAXILLOFACIAL WITHOUT CONTRAST CT CERVICAL SPINE WITHOUT CONTRAST TECHNIQUE: Multidetector  CT imaging of the head, cervical spine, and maxillofacial structures were performed using the standard protocol without intravenous contrast. Multiplanar CT image reconstructions of the cervical spine and maxillofacial structures were also generated. COMPARISON:  Cervical spine radiographs 11/05/2015. Head CT without contrast 10/22/2014. Cervical spine MRI 05/31/2011. FINDINGS: CT HEAD FINDINGS Brain: Stable cerebral volume since 2015, within normal limits for age. No midline shift, ventriculomegaly, mass effect, evidence of mass lesion, intracranial hemorrhage or evidence of cortically based acute infarction. Mild patchy subcortical white matter hypodensity appears increased since 2015, but otherwise the gray-white matter differentiation is within normal limits throughout the brain. Vascular: Calcified atherosclerosis at the skull base. Skull: No skull fracture identified. Other: Bilateral tympanic cavities and mastoids are stable in clear. Large and broad-based right anterior convexity scalp hematoma measuring up to 2 centimeters in thickness. Underlying calvarium appears stable and intact. No soft tissue gas. Other scalp soft tissues are within normal limits. CT MAXILLOFACIAL FINDINGS Osseous: Mandible intact. No maxilla or zygoma fracture. Intact nasal bones. Intact pterygoid plates and no skull base fracture identified. Orbits: Bilateral orbital walls the are intact. Postoperative changes to both globes but otherwise the orbits soft tissues appear intact and normal. Sinuses: Clear. Soft tissues: Negative visible noncontrast deep soft tissue spaces of the face. No superficial face soft tissue injury identified. CT CERVICAL SPINE FINDINGS  Alignment: Chronic straightening of cervical lordosis. Anterolisthesis of C2 on C3 measuring 3-4 millimeters appears increased since 2016. Skull base and vertebrae: Extensive postoperative changes to the cervical spine since 2016. There is ACDF hardware in place at the cervicothoracic junction, C7-T1. Interbody fusion changes are present from C3 through C7, and into the upper thoracic spine. There is evidence of pseudoarthrosis at C4-C5, but otherwise solid-appearing interbody ankylosis from the C3 to the T3 level. Additional solid bilateral posterior element ankylosis from C7 into the upper thoracic spine. Visualized skull base is intact. No atlanto-occipital dissociation. No cervical spine fracture identified. Soft tissues and spinal canal: No prevertebral fluid or swelling. No visible canal hematoma. Hardware streak artifact in the lower prevertebral space. Disc levels: Postoperative changes with cervical and upper thoracic arthrodesis as stated above. Underlying advanced chronic disc and endplate degeneration. Mild chronic C3-C4 spinal stenosis. Upper chest: Upper thoracic spine arthrodesis. Visible upper thoracic levels appear grossly intact. Partially visible posterior thoracic spinal rods are fusion hardware. Negative lung apices. Negative visible noncontrast superior mediastinum. IMPRESSION: 1. Large (2 cm thick) and broad-based scalp hematoma without underlying skull fracture. 2. No acute intracranial abnormality. Mild for age white matter changes have increased since 2015. 3. No acute traumatic injury identified in the face or cervical spine. 4. Extensive fusion changes to the cervical spine since 2016 superimposed on cervicothoracic junction and upper thoracic spine fusion. 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 Electronically Signed   By: Genevie Ann M.D.   On: 02/08/2018 15:14   Ct Cervical Spine Wo Contrast  Result Date: 02/08/2018 CLINICAL DATA:  72 year old female status post fall at home into the corner  of a table. Right forehead hematoma, right side neck pain. EXAM: CT HEAD WITHOUT CONTRAST CT MAXILLOFACIAL WITHOUT CONTRAST CT CERVICAL SPINE WITHOUT CONTRAST TECHNIQUE: Multidetector CT imaging of the head, cervical spine, and maxillofacial structures were performed using the standard protocol without intravenous contrast. Multiplanar CT image reconstructions of the cervical spine and maxillofacial structures were also generated. COMPARISON:  Cervical spine radiographs 11/05/2015. Head CT without contrast 10/22/2014. Cervical spine MRI 05/31/2011. FINDINGS: CT HEAD FINDINGS Brain: Stable cerebral volume since 2015, within normal  limits for age. No midline shift, ventriculomegaly, mass effect, evidence of mass lesion, intracranial hemorrhage or evidence of cortically based acute infarction. Mild patchy subcortical white matter hypodensity appears increased since 2015, but otherwise the gray-white matter differentiation is within normal limits throughout the brain. Vascular: Calcified atherosclerosis at the skull base. Skull: No skull fracture identified. Other: Bilateral tympanic cavities and mastoids are stable in clear. Large and broad-based right anterior convexity scalp hematoma measuring up to 2 centimeters in thickness. Underlying calvarium appears stable and intact. No soft tissue gas. Other scalp soft tissues are within normal limits. CT MAXILLOFACIAL FINDINGS Osseous: Mandible intact. No maxilla or zygoma fracture. Intact nasal bones. Intact pterygoid plates and no skull base fracture identified. Orbits: Bilateral orbital walls the are intact. Postoperative changes to both globes but otherwise the orbits soft tissues appear intact and normal. Sinuses: Clear. Soft tissues: Negative visible noncontrast deep soft tissue spaces of the face. No superficial face soft tissue injury identified. CT CERVICAL SPINE FINDINGS Alignment: Chronic straightening of cervical lordosis. Anterolisthesis of C2 on C3 measuring  3-4 millimeters appears increased since 2016. Skull base and vertebrae: Extensive postoperative changes to the cervical spine since 2016. There is ACDF hardware in place at the cervicothoracic junction, C7-T1. Interbody fusion changes are present from C3 through C7, and into the upper thoracic spine. There is evidence of pseudoarthrosis at C4-C5, but otherwise solid-appearing interbody ankylosis from the C3 to the T3 level. Additional solid bilateral posterior element ankylosis from C7 into the upper thoracic spine. Visualized skull base is intact. No atlanto-occipital dissociation. No cervical spine fracture identified. Soft tissues and spinal canal: No prevertebral fluid or swelling. No visible canal hematoma. Hardware streak artifact in the lower prevertebral space. Disc levels: Postoperative changes with cervical and upper thoracic arthrodesis as stated above. Underlying advanced chronic disc and endplate degeneration. Mild chronic C3-C4 spinal stenosis. Upper chest: Upper thoracic spine arthrodesis. Visible upper thoracic levels appear grossly intact. Partially visible posterior thoracic spinal rods are fusion hardware. Negative lung apices. Negative visible noncontrast superior mediastinum. IMPRESSION: 1. Large (2 cm thick) and broad-based scalp hematoma without underlying skull fracture. 2. No acute intracranial abnormality. Mild for age white matter changes have increased since 2015. 3. No acute traumatic injury identified in the face or cervical spine. 4. Extensive fusion changes to the cervical spine since 2016 superimposed on cervicothoracic junction and upper thoracic spine fusion. 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 Electronically Signed   By: Genevie Ann M.D.   On: 02/08/2018 15:14   Ct Maxillofacial Wo Contrast  Result Date: 02/08/2018 CLINICAL DATA:  72 year old female status post fall at home into the corner of a table. Right forehead hematoma, right side neck pain. EXAM: CT HEAD WITHOUT CONTRAST CT  MAXILLOFACIAL WITHOUT CONTRAST CT CERVICAL SPINE WITHOUT CONTRAST TECHNIQUE: Multidetector CT imaging of the head, cervical spine, and maxillofacial structures were performed using the standard protocol without intravenous contrast. Multiplanar CT image reconstructions of the cervical spine and maxillofacial structures were also generated. COMPARISON:  Cervical spine radiographs 11/05/2015. Head CT without contrast 10/22/2014. Cervical spine MRI 05/31/2011. FINDINGS: CT HEAD FINDINGS Brain: Stable cerebral volume since 2015, within normal limits for age. No midline shift, ventriculomegaly, mass effect, evidence of mass lesion, intracranial hemorrhage or evidence of cortically based acute infarction. Mild patchy subcortical white matter hypodensity appears increased since 2015, but otherwise the gray-white matter differentiation is within normal limits throughout the brain. Vascular: Calcified atherosclerosis at the skull base. Skull: No skull fracture identified. Other: Bilateral tympanic cavities  and mastoids are stable in clear. Large and broad-based right anterior convexity scalp hematoma measuring up to 2 centimeters in thickness. Underlying calvarium appears stable and intact. No soft tissue gas. Other scalp soft tissues are within normal limits. CT MAXILLOFACIAL FINDINGS Osseous: Mandible intact. No maxilla or zygoma fracture. Intact nasal bones. Intact pterygoid plates and no skull base fracture identified. Orbits: Bilateral orbital walls the are intact. Postoperative changes to both globes but otherwise the orbits soft tissues appear intact and normal. Sinuses: Clear. Soft tissues: Negative visible noncontrast deep soft tissue spaces of the face. No superficial face soft tissue injury identified. CT CERVICAL SPINE FINDINGS Alignment: Chronic straightening of cervical lordosis. Anterolisthesis of C2 on C3 measuring 3-4 millimeters appears increased since 2016. Skull base and vertebrae: Extensive postoperative  changes to the cervical spine since 2016. There is ACDF hardware in place at the cervicothoracic junction, C7-T1. Interbody fusion changes are present from C3 through C7, and into the upper thoracic spine. There is evidence of pseudoarthrosis at C4-C5, but otherwise solid-appearing interbody ankylosis from the C3 to the T3 level. Additional solid bilateral posterior element ankylosis from C7 into the upper thoracic spine. Visualized skull base is intact. No atlanto-occipital dissociation. No cervical spine fracture identified. Soft tissues and spinal canal: No prevertebral fluid or swelling. No visible canal hematoma. Hardware streak artifact in the lower prevertebral space. Disc levels: Postoperative changes with cervical and upper thoracic arthrodesis as stated above. Underlying advanced chronic disc and endplate degeneration. Mild chronic C3-C4 spinal stenosis. Upper chest: Upper thoracic spine arthrodesis. Visible upper thoracic levels appear grossly intact. Partially visible posterior thoracic spinal rods are fusion hardware. Negative lung apices. Negative visible noncontrast superior mediastinum. IMPRESSION: 1. Large (2 cm thick) and broad-based scalp hematoma without underlying skull fracture. 2. No acute intracranial abnormality. Mild for age white matter changes have increased since 2015. 3. No acute traumatic injury identified in the face or cervical spine. 4. Extensive fusion changes to the cervical spine since 2016 superimposed on cervicothoracic junction and upper thoracic spine fusion. 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 Electronically Signed   By: Genevie Ann M.D.   On: 02/08/2018 15:14    Procedures Procedures (including critical care time)  Medications Ordered in ED Medications  ondansetron (ZOFRAN) tablet 4 mg (4 mg Oral Given 02/08/18 1424)  fentaNYL (SUBLIMAZE) injection 50 mcg (50 mcg Intravenous Given 02/08/18 1435)  ondansetron (ZOFRAN) injection 4 mg (4 mg Intravenous Given 02/08/18 1546)      Initial Impression / Assessment and Plan / ED Course  I have reviewed the triage vital signs and the nursing notes.  Pertinent labs & imaging results that were available during my care of the patient were reviewed by me and considered in my medical decision making (see chart for details).     72 year old female who presents for evaluation after mechanical fall that occurred 30 minutes prior to ED arrival.  Patient tripped and fell forward, hitting her face on a cabinet and head on the corner of the counter.  Denies any LOC but states she cannot remember the entire event.  No preceding chest pain, dizziness.  Patient is not on blood thinners.  No vomiting noted. Patient is afebrile, non-toxic appearing, sitting comfortably on examination table. Vital signs reviewed and stable. No neuro deficits noted on exam.  Plan to check CT C-spine, CT maxillofacial, CT head for evaluation.  CT head shows hematoma but otherwise no skull fracture, ICH.  CT C-spine negative for any acute abnormality.  CT maxillofacial  is negative for any acute facial fracture.  Discussed results with patient.  Patient has had some nausea secondary to the pain medication.  But otherwise reports improvement in pain.  Vital signs are stable.  Discussed head injury precautions with patient and husband.  Encouraged follow-up with PCP in the next 24-48 hours for further evaluation.  Will plan to send patient home with antiemetics to help with nausea. Patient had ample opportunity for questions and discussion. All patient's questions were answered with full understanding. Strict return precautions discussed. Patient expresses understanding and agreement to plan.    Final Clinical Impressions(s) / ED Diagnoses   Final diagnoses:  Minor head injury, initial encounter  Hematoma  Abrasion    ED Discharge Orders        Ordered    ondansetron (ZOFRAN) 4 MG tablet  Every 6 hours     02/08/18 1534       Volanda Napoleon,  PA-C 02/08/18 1640    Sherwood Gambler, MD 02/08/18 1714

## 2018-02-08 NOTE — Discharge Instructions (Signed)
You can take Tylenol or Ibuprofen as directed for pain. You can alternate Tylenol and Ibuprofen every 4 hours. If you take Tylenol at 1pm, then you can take Ibuprofen at 5pm. Then you can take Tylenol again at 9pm.   Apply ice to the affected area to help with pain and swelling.   Take zofran for nausea.   Follow-up with your primary care doctor in the next 24-48 hours for further evaluation.    RETURN TO THE EMERGENCY DEPARTMENT AND SEEK IMMEDIATE MEDICAL ATTENTION IF:  - There is confusion or drowsiness (although children frequently become drowsy after injury). You cannot awaken the injuredperson.   - There is nausea (feeling sick to your stomach) or continued, forceful vomiting. You notice dizziness or unsteadiness which is getting worse, or inability to walk.   - You have convulsions or unconsciousness.   - You experience severe, persistent headaches not relieved by Tylenol. (Do not take aspirin as this impairs clotting abilities). Take other pain medications only as directed.   - You cannot use arms or legs normally.   - There is clear or bloody discharge from the nose or ears.   - Change in speech, vision, swallowing, or understanding.   - Localized weakness, numbness, tingling, or change in bowel or bladder control.

## 2018-02-08 NOTE — ED Notes (Signed)
Patient transported to CT 

## 2018-02-13 ENCOUNTER — Ambulatory Visit (INDEPENDENT_AMBULATORY_CARE_PROVIDER_SITE_OTHER): Payer: Medicare Other

## 2018-02-13 ENCOUNTER — Encounter: Payer: Self-pay | Admitting: Gynecology

## 2018-02-13 ENCOUNTER — Other Ambulatory Visit: Payer: Self-pay | Admitting: Gynecology

## 2018-02-13 DIAGNOSIS — M85832 Other specified disorders of bone density and structure, left forearm: Secondary | ICD-10-CM

## 2018-02-13 DIAGNOSIS — Z780 Asymptomatic menopausal state: Secondary | ICD-10-CM

## 2018-04-10 ENCOUNTER — Ambulatory Visit: Payer: Medicare Other | Admitting: Podiatry

## 2018-04-17 ENCOUNTER — Ambulatory Visit: Payer: Medicare Other | Admitting: Podiatry

## 2018-04-17 ENCOUNTER — Encounter: Payer: Self-pay | Admitting: Podiatry

## 2018-04-17 DIAGNOSIS — M79676 Pain in unspecified toe(s): Secondary | ICD-10-CM | POA: Diagnosis not present

## 2018-04-17 DIAGNOSIS — M2041 Other hammer toe(s) (acquired), right foot: Secondary | ICD-10-CM

## 2018-04-17 DIAGNOSIS — M2042 Other hammer toe(s) (acquired), left foot: Secondary | ICD-10-CM

## 2018-04-17 DIAGNOSIS — B351 Tinea unguium: Secondary | ICD-10-CM | POA: Diagnosis not present

## 2018-04-17 NOTE — Progress Notes (Signed)
Complaint:  Visit Type: Patient returns to my office for continued preventative foot care services. Complaint: Patient states" my nails have grown long and thick and become painful to walk and wear shoes" Patient has prominent forefoot bones both feet. The patient presents for preventative foot care services. No changes to ROS  Podiatric Exam: Vascular: dorsalis pedis and posterior tibial pulses are palpable bilateral. Capillary return is immediate. Temperature gradient is WNL. Skin turgor WNL  Sensorium: Normal Semmes Weinstein monofilament test. Normal tactile sensation bilaterally. Nail Exam: Pt has thick disfigured discolored nails with subungual debris noted bilateral entire nail hallux through fifth toenails Ulcer Exam: There is no evidence of ulcer or pre-ulcerative changes or infection. Orthopedic Exam: Muscle tone and strength are WNL. No limitations in general ROM. No crepitus or effusions noted. 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 No infection or ulcers  Diagnosis:  Onychomycosis, , Pain in right toe, pain in left toes  Treatment & Plan Procedures and Treatment: Consent by patient was obtained for treatment procedures. The patient understood the discussion of treatment and procedures well. All questions were answered thoroughly reviewed. Debridement of mycotic and hypertrophic toenails, 1 through 5 bilateral and clearing of subungual debris. No ulceration, no infection noted.   Return Visit-Office Procedure: Patient instructed to return to the office for a follow up visit 3 months for continued evaluation and treatment.    Gardiner Barefoot DPM

## 2018-05-24 ENCOUNTER — Emergency Department: Payer: Medicare Other

## 2018-05-24 ENCOUNTER — Encounter: Payer: Self-pay | Admitting: Emergency Medicine

## 2018-05-24 ENCOUNTER — Emergency Department
Admission: EM | Admit: 2018-05-24 | Discharge: 2018-05-24 | Disposition: A | Discharge status: Home / Self Care | Payer: Medicare Other | Attending: Student in an Organized Health Care Education/Training Program | Admitting: Student in an Organized Health Care Education/Training Program

## 2018-05-24 ENCOUNTER — Other Ambulatory Visit: Payer: Self-pay

## 2018-05-24 DIAGNOSIS — R519 Headache, unspecified: Secondary | ICD-10-CM

## 2018-05-24 DIAGNOSIS — Z853 Personal history of malignant neoplasm of breast: Secondary | ICD-10-CM | POA: Diagnosis not present

## 2018-05-24 DIAGNOSIS — R51 Headache: Secondary | ICD-10-CM | POA: Diagnosis present

## 2018-05-24 DIAGNOSIS — Z79899 Other long term (current) drug therapy: Secondary | ICD-10-CM | POA: Insufficient documentation

## 2018-05-24 DIAGNOSIS — H539 Unspecified visual disturbance: Secondary | ICD-10-CM | POA: Diagnosis not present

## 2018-05-24 DIAGNOSIS — Z85828 Personal history of other malignant neoplasm of skin: Secondary | ICD-10-CM | POA: Insufficient documentation

## 2018-05-24 DIAGNOSIS — I10 Essential (primary) hypertension: Secondary | ICD-10-CM | POA: Diagnosis not present

## 2018-05-24 LAB — PROTIME-INR
INR: 0.9
PROTHROMBIN TIME: 12.1 s (ref 11.4–15.2)

## 2018-05-24 LAB — COMPREHENSIVE METABOLIC PANEL
ALK PHOS: 75 U/L (ref 38–126)
ALT: 15 U/L (ref 0–44)
AST: 24 U/L (ref 15–41)
Albumin: 4.2 g/dL (ref 3.5–5.0)
Anion gap: 8 (ref 5–15)
BILIRUBIN TOTAL: 0.9 mg/dL (ref 0.3–1.2)
BUN: 21 mg/dL (ref 8–23)
CALCIUM: 9.1 mg/dL (ref 8.9–10.3)
CO2: 29 mmol/L (ref 22–32)
CREATININE: 0.64 mg/dL (ref 0.44–1.00)
Chloride: 103 mmol/L (ref 98–111)
GFR calc non Af Amer: >60 mL/min (ref >60)
Glucose, Bld: 93 mg/dL (ref 70–99)
Potassium: 3.9 mmol/L (ref 3.5–5.1)
Sodium: 140 mmol/L (ref 135–145)
TOTAL PROTEIN: 7 g/dL (ref 6.5–8.1)

## 2018-05-24 LAB — DIFFERENTIAL
BASOS PCT: 1 %
Basophils Absolute: 0 10*3/uL (ref 0–0.1)
Eosinophils Absolute: 0.1 10*3/uL (ref 0–0.7)
Eosinophils Relative: 3 %
Lymphocytes Relative: 44 %
Lymphs Abs: 2 10*3/uL (ref 1.0–3.6)
MONO ABS: 0.3 10*3/uL (ref 0.2–0.9)
Monocytes Relative: 7 %
NEUTROS ABS: 2 10*3/uL (ref 1.4–6.5)
Neutrophils Relative %: 45 %

## 2018-05-24 LAB — CBC
HCT: 43.8 % (ref 35.0–47.0)
Hemoglobin: 14.8 g/dL (ref 12.0–16.0)
MCH: 30.2 pg (ref 26.0–34.0)
MCHC: 33.7 g/dL (ref 32.0–36.0)
MCV: 89.4 fL (ref 80.0–100.0)
PLATELETS: 217 10*3/uL (ref 150–440)
RBC: 4.9 MIL/uL (ref 3.80–5.20)
RDW: 13.5 % (ref 11.5–14.5)
WBC: 4.5 10*3/uL (ref 3.6–11.0)

## 2018-05-24 LAB — SEDIMENTATION RATE: SED RATE: 4 mm/h (ref 0–30)

## 2018-05-24 LAB — APTT: aPTT: 28 seconds (ref 24–36)

## 2018-05-24 LAB — TROPONIN I: TROPONIN I: 0.03 ng/mL — AB (ref <0.03)

## 2018-05-24 MED ORDER — IOPAMIDOL (ISOVUE-370) INJECTION 76%
75.0000 mL | Freq: Once | INTRAVENOUS | Status: AC | PRN
  Administered 2018-05-24: 75 mL via INTRAVENOUS

## 2018-05-24 MED ORDER — FAMOTIDINE IN NACL 20-0.9 MG/50ML-% IV SOLN
20.0000 mg | Freq: Once | INTRAVENOUS | Status: AC
  Administered 2018-05-24: 20 mg via INTRAVENOUS
  Filled 2018-05-24: qty 50

## 2018-05-24 MED ORDER — DIPHENHYDRAMINE HCL 50 MG/ML IJ SOLN
INTRAMUSCULAR | Status: AC
  Filled 2018-05-24: qty 1

## 2018-05-24 MED ORDER — DIPHENHYDRAMINE HCL 50 MG/ML IJ SOLN
25.0000 mg | Freq: Once | INTRAMUSCULAR | Status: AC
  Administered 2018-05-24: 25 mg via INTRAVENOUS

## 2018-05-24 NOTE — ED Notes (Signed)
Offered pt something to eat and pt reports she is not hungry at this time.

## 2018-05-24 NOTE — Discharge Instructions (Addendum)

## 2018-05-24 NOTE — ED Notes (Signed)
Pt ambulated to and from restroom. Pt reports feeling a little off balanced but says that is baseline for her. Pt denies complaints at this time. Husband remains at bedside. Call bell within reach, will continue to monitor.

## 2018-05-24 NOTE — ED Notes (Signed)
Pt reports while swimming today she started having R sided headache and R visual loss. Pt reports symptoms lasted approx 10 minutes and then went back to normal. Pt went home and began to have CP. Pt reports all symptoms resolved and she is back to normal at this time. Pt denies any complaints. A & O x4. Pt denies SOB or N/V/D.

## 2018-05-24 NOTE — ED Provider Notes (Signed)
[facility] Emergency Department Provider Note    First MD Initiated Contact with Patient 05/24/18 1407     (approximate)  I have reviewed the triage vital signs and the nursing notes.   HISTORY  Chief Complaint Headache and Visual Field Change    HPI [name] is a 72 y.o. female history of migraine headaches presents to the ER with chief complaint of right sided temporal headache associated with flashing lights in her right visual field.  Symptoms lasted roughly 10 minutes.  They were rapid in onset and also quickly resolved after the patient was rubbing her temple.  She has had similar headaches in the past but never had this type of visual field deficit.  Denies any blurry vision at this time.  No numbness or tingling.  No headache at this time.  Does get aura and scotomas with other migraine symptoms.  Denies any nausea or vomiting.  No recent fevers.    Past Medical History:  Diagnosis Date  . Arthritis   . Arthropathy of right shoulder   . Breast cancer ([place])   . Breast cancer ([place])   . DJD (degenerative joint disease)   . Family history of adverse reaction to anesthesia    " My sister had 2 migrine strokes waking up from her back procedure."  . GERD (gastroesophageal reflux disease)   . Headache(784.0)    migraines  . History of cancer chemotherapy 1996   ALSO RADIATION - BREAST CANCER  . History of skin cancer   . Hx-TIA (transient ischemic attack)    X2  . Hyperlipidemia   . Hypertension   . Osteopenia 02/2018   T score -1.4 distal third of left radius spine and hips excluded due to degenerative changes and hip surgeries  . Personal history of chemotherapy 1997  . Personal history of radiation therapy 1996  . PONV (postoperative nausea and vomiting)    desat during procedure on 10/2015 led oxygen use for 1 month post-op  . Squamous cell carcinoma   . Wears glasses    Family History  Problem Relation Age of Onset  . Breast  cancer Mother 41  . Hypertension Father   . Heart attack Father   . Hyperlipidemia Father   . Colon cancer Father   . Breast cancer Maternal Grandmother 83  . Stroke Maternal Grandfather   . Heart attack Paternal Grandmother   . Cervical cancer Paternal Grandmother   . Heart attack Paternal Grandfather   . Hypertension Sister    Past Surgical History:  Procedure Laterality Date  . back fusion  2005, 2008, 2011   T4-S1  . BREAST BIOPSY Left 1996  . BREAST LUMPECTOMY Left 1996  . BUNIONECTOMY Bilateral   . CATARACT EXTRACTION W/ INTRAOCULAR LENS  IMPLANT, BILATERAL    . CHOLECYSTECTOMY    . COLONOSCOPY    . HYSTEROSCOPY W/D&C N/A 08/15/2013   Procedure: DILATATION AND CURETTAGE /HYSTEROSCOPY WITH RESECTION;  Surgeon: Princess Bruins, MD;  Location: [facility];  Service: Gynecology;  Laterality: N/A;  . MULTIPLE TOOTH EXTRACTIONS    . NM MYOVIEW LTD  12/13/2013   Low risk study, no areas of significant ischemia. Mild apical, distal anterolateral wall perfusion defect is mostly fixed and likely represents attenuation artifact. EF 58%.  Marland Kitchen REVERSE SHOULDER ARTHROPLASTY Right 05/27/2017   Procedure: REVERSE RIGHT SHOULDER ARTHROPLASTY;  Surgeon: Netta Cedars, MD;  Location: [facility];  Service: Orthopedics;  Laterality: Right;  . ROTATOR CUFF REPAIR Right  x 2  . SHOULDER ARTHROSCOPY Left   . THUMB ARTHROSCOPY Right    left, cyst removal  . TONSILLECTOMY     x 4, kept growing back  . TONSILLECTOMY    . TOTAL HIP ARTHROPLASTY Bilateral 1987, [place]   right  x 2  . TOTAL HIP REVISION Left 04/22/2015   Procedure: LEFT HIP REVISION ACETABULAR COMPONENT;  Surgeon: Rod Can, MD;  Location: [facility];  Service: Orthopedics;  Laterality: Left;   Patient Active Problem List   Diagnosis Date Noted  . DDD (degenerative disc disease) 06/06/2017  . Acid reflux 06/06/2017  . HLD (hyperlipidemia) 06/06/2017  . 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 S/P shoulder  replacement, right 05/27/2017  . Cervical radiculopathy 10/28/2015  . Spondylolisthesis of cervical region 10/28/2015  . Obesity 05/21/2015  . Acute respiratory failure ([place]) 05/20/2015  . 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 Chest pain, moderate coronary artery risk 04/22/2015  . Abdominal pain 03/06/2014  . Malignant neoplasm of female breast ([place]) 04/23/2008  . Hypercholesterolemia without hypertriglyceridemia 04/23/2008  . Benign essential HTN 04/23/2008  . Temporary cerebral vascular dysfunction 04/23/2008  . ARTHRITIS 04/23/2008  . 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 DEEP VENOUS THROMBOPHLEBITIS, HX OF 04/23/2008  . HEADACHE, CHRONIC, HX OF 04/23/2008  . DIVERTICULOSIS, COLON 12/15/2006  . BENIGN NEOPLASM OF ADRENAL GLAND 10/25/2006      Prior to Admission medications   Medication Sig Start Date End Date Taking? Authorizing Provider  acetaminophen (TYLENOL) 500 MG tablet Take 1,000 mg by mouth every 8 (eight) hours as needed for moderate pain.    [provider]  acetaminophen-codeine (TYLENOL #3) 300-30 MG tablet Take 1 tablet by mouth daily as needed for moderate pain.    [provider]  Biotin 10000 MCG TABS Take 1 tablet by mouth at bedtime.    [provider]  Calcium-Vitamin D-Vitamin K (VIACTIV PO) Take 1 tablet by mouth every morning.     [provider]  celecoxib (CELEBREX) 200 MG capsule Take 1 capsule by mouth daily. 07/21/15   [provider]  cycloSPORINE (RESTASIS) 0.05 % ophthalmic emulsion Place 1 drop into both eyes 2 (two) times daily.    [provider]  gabapentin (NEURONTIN) 300 MG capsule Take 300 mg by mouth 2 (two) times daily.  09/13/16   [provider]  losartan-hydrochlorothiazide (HYZAAR) 50-12.5 MG per tablet Take 1 tablet by mouth at bedtime.     [provider]  Multiple Vitamins-Minerals (CENTRUM SILVER PO) Take  1 tablet by mouth daily.    [provider]  ondansetron (ZOFRAN) 4 MG tablet Take 1 tablet (4 mg total) by mouth every 6 (six) hours. 02/08/18   Volanda Napoleon, PA-C  pantoprazole (PROTONIX) 40 MG tablet Take 40 mg by mouth daily.    [provider]  simvastatin (ZOCOR) 40 MG tablet Take 1 tablet (40 mg total) by mouth every evening. 08/15/13   Princess Bruins, MD  vitamin C (ASCORBIC ACID) 500 MG tablet Take 500 mg by mouth daily.    [provider]    Allergies Demerol [meperidine]; Hydrocodone; Sulfa antibiotics; Tincture of benzoin [benzoin]; Iodinated diagnostic agents; Dexamethasone; and Oxycodone    Social History Social History   Tobacco Use  . Smoking status: Never Smoker  . Smokeless tobacco: Never Used  Substance Use Topics  . Alcohol use: Yes    Alcohol/week: 0.0 oz    Comment:  rare   . Drug use: No    Review of Systems Patient denies headaches, rhinorrhea, blurry vision, numbness, shortness of breath, chest pain, edema, cough, abdominal pain, nausea, vomiting, diarrhea, dysuria, fevers, rashes or hallucinations unless otherwise stated above in HPI. ____________________________________________   PHYSICAL EXAM:  VITAL SIGNS: Vitals:   05/24/18 1612 05/24/18 1630  BP: (!) 150/80 (!) 150/84  Pulse: 65 65  Resp: 12 16  SpO2: 97% 96%    Constitutional: Alert and oriented.  Eyes: Conjunctivae are normal.  Optic disc crisp.  No vitreous hemorrhage.  No evidence of retinal detachment.  No visual field cuts. Head: Atraumatic. Nose: No congestion/rhinnorhea. Mouth/Throat: Mucous membranes are moist.   Neck: No stridor. Painless ROM.  Cardiovascular: Normal rate, regular rhythm. Grossly normal heart sounds.  Good peripheral circulation. Respiratory: Normal respiratory effort.  No retractions. Lungs CTAB. Gastrointestinal: Soft and nontender. No distention. No abdominal bruits. No CVA tenderness. Genitourinary:  deferred Musculoskeletal: No lower extremity tenderness nor edema.  No joint effusions. Neurologic:  CN- intact.  No facial droop, Normal FNF.  Normal heel to shin.  Sensation intact bilaterally. Normal speech and language. No gross focal neurologic deficits are appreciated. No gait instability.  Skin:  Skin is warm, dry and intact. No rash noted. Psychiatric: Mood and affect are normal. Speech and behavior are normal.  ____________________________________________   LABS (all labs ordered are listed, but only abnormal results are displayed)  Results for orders placed or performed during the hospital encounter of 05/24/18 (from the past 24 hour(s))  Protime-INR     Status: None   Collection Time: 05/24/18 12:27 PM  Result Value Ref Range   Prothrombin Time 12.1 11.4 - 15.2 seconds   INR 0.90   APTT     Status: None   Collection Time: 05/24/18 12:27 PM  Result Value Ref Range   aPTT 28 24 - 36 seconds  CBC     Status: None   Collection Time: 05/24/18 12:27 PM  Result Value Ref Range   WBC 4.5 3.6 - 11.0 K/uL   RBC 4.90 3.80 - 5.20 MIL/uL   Hemoglobin 14.8 12.0 - 16.0 g/dL   HCT 43.8 35.0 - 47.0 %   MCV 89.4 80.0 - 100.0 fL   MCH 30.2 26.0 - 34.0 pg   MCHC 33.7 32.0 - 36.0 g/dL   RDW 13.5 11.5 - 14.5 %   Platelets 217 150 - 440 K/uL  Differential     Status: None   Collection Time: 05/24/18 12:27 PM  Result Value Ref Range   Neutrophils Relative % 45 %   Neutro Abs 2.0 1.4 - 6.5 K/uL   Lymphocytes Relative 44 %   Lymphs Abs 2.0 1.0 - 3.6 K/uL   Monocytes Relative 7 %   Monocytes Absolute 0.3 0.2 - 0.9 K/uL   Eosinophils Relative 3 %   Eosinophils Absolute 0.1 0 - 0.7 K/uL   Basophils Relative 1 %   Basophils Absolute 0.0 0 - 0.1 K/uL  Comprehensive metabolic panel     Status: None   Collection Time: 05/24/18 12:27 PM  Result Value Ref Range   Sodium 140 135 - 145 mmol/L   Potassium 3.9 3.5 - 5.1 mmol/L   Chloride 103 98 - 111 mmol/L   CO2 29 22 - 32 mmol/L    Glucose, Bld 93 70 - 99 mg/dL   BUN 21 8 - 23 mg/dL   Creatinine, Ser 0.64 0.44 - 1.00 mg/dL   Calcium 9.1 8.9 -  10.3 mg/dL   Total Protein 7.0 6.5 - 8.1 g/dL   Albumin 4.2 3.5 - 5.0 g/dL   AST 24 15 - 41 U/L   ALT 15 0 - 44 U/L   Alkaline Phosphatase 75 38 - 126 U/L   Total Bilirubin 0.9 0.3 - 1.2 mg/dL   GFR calc non Af Amer >60 >60 mL/min   GFR calc Af Amer >60 >60 mL/min   Anion gap 8 5 - 15  Troponin I     Status: Abnormal   Collection Time: 05/24/18 12:27 PM  Result Value Ref Range   Troponin I 0.03 (HH) <0.03 ng/mL  Sedimentation rate     Status: None   Collection Time: 05/24/18  2:32 PM  Result Value Ref Range   Sed Rate 4 0 - 30 mm/hr   ____________________________________________  EKG My review and personal interpretation at Time: 12:18   Indication: vision change  Rate: 70  Rhythm: sinus Axis: normal Other: normal intevals, nostemi ____________________________________________  RADIOLOGY  I personally reviewed all radiographic images ordered to evaluate for the above acute complaints and reviewed radiology reports and findings.  These findings were personally discussed with the patient.  Please see medical record for radiology report.  ____________________________________________   PROCEDURES  Procedure(s) performed:  Procedures    Critical Care performed: no ____________________________________________   INITIAL IMPRESSION / ASSESSMENT AND PLAN / ED COURSE  Pertinent labs & imaging results that were available during my care of the patient were reviewed by me and considered in my medical decision making (see chart for details).   DDX: Migraine, retinal detachment, retinal hemorrhage, retinal artery embolism, aneurysm, dissection, migraine, gca  WHITTANY PARISH is a 72 y.o. who presents to the ED with symptoms as described above.  Patient asymptomatic at this time.  Symptoms briefly nature.  Ultrasound shows no evidence of detachment or vitreous  hemorrhage.  Visual acuity and exam nonfocal she has no visual field cuts.  Based on her presentation will order CT imaging to evaluate for aneurysm.  Will reassess.  Clinical Course as of May 24 1716  Wed May 24, 2018  1713 ESR is negative.  Patient reassessed and asymptomatic.  Discussed results of CT imaging with patient.  Did have more reaction to contrast for which she received Benadryl IV.  Hives and symptoms resolved.  At this point believe patient is stable and appropriate for outpatient follow-up.  Will be given referral to neurology as well as ophthalmology.  Have discussed with the patient and available family all diagnostics and treatments performed thus far and all questions were answered to the best of my ability. The patient demonstrates understanding and agreement with plan.    [PR]    Clinical Course User Index [PR] Merlyn Lot, MD     As part of my medical decision making, I reviewed the following data within the [facility] notes reviewed and incorporated, Labs reviewed, notes from prior ED visits.   ____________________________________________   FINAL CLINICAL IMPRESSION(S) / ED DIAGNOSES  Final diagnoses:  Bad headache  Vision abnormalities      NEW MEDICATIONS STARTED DURING THIS VISIT:  New Prescriptions   No medications on file     Note:  This document was prepared using Dragon voice recognition software and may include unintentional dictation errors.    Merlyn Lot, MD 05/24/18 [phone]

## 2018-05-24 NOTE — ED Notes (Signed)
Hives have gone away and pt reports feeling better. VSS. Call bell within reach, will continue to monitor.

## 2018-05-24 NOTE — ED Notes (Signed)
Pt returned from CT and reports hives to back of neck and itching. Pt denies SOB or CP. Notified MD Quentin Cornwall.

## 2018-05-24 NOTE — ED Notes (Signed)
Pt's care discussed with Dr. Quentin Cornwall, bloodwork and CT ordered, due to symptoms completely resolving not a code stroke.

## 2018-05-24 NOTE — ED Triage Notes (Addendum)
[facility] had "excruciating pain" over R eye with intermittent black and white flashing lights to R eye. Pt states symptoms lasted approx 10 mins and resolved completely. Pt neurologically intact at this time. Pt states hx of migraine at this time. States recently incidences of migraines have increased. Pt states symptoms started at 0900 today. Pt with hx of TIA at this time and pt describes HA as "the worst headache I've ever had".

## 2018-05-30 ENCOUNTER — Encounter: Payer: Self-pay | Admitting: Diagnostic Neuroimaging

## 2018-05-30 ENCOUNTER — Telehealth: Payer: Self-pay | Admitting: Diagnostic Neuroimaging

## 2018-05-30 ENCOUNTER — Ambulatory Visit: Payer: Medicare Other | Admitting: Diagnostic Neuroimaging

## 2018-05-30 VITALS — BP 121/76 | HR 74 | Ht 60.0 in | Wt 156.0 lb

## 2018-05-30 DIAGNOSIS — R269 Unspecified abnormalities of gait and mobility: Secondary | ICD-10-CM | POA: Diagnosis not present

## 2018-05-30 DIAGNOSIS — G3281 Cerebellar ataxia in diseases classified elsewhere: Secondary | ICD-10-CM

## 2018-05-30 DIAGNOSIS — G459 Transient cerebral ischemic attack, unspecified: Secondary | ICD-10-CM

## 2018-05-30 MED ORDER — CLOPIDOGREL BISULFATE 75 MG PO TABS: 75.0000 mg | ORAL_TABLET | Freq: Every day | ORAL | 12 refills | Status: DC

## 2018-05-30 NOTE — Patient Instructions (Addendum)
TIA vs MIGRAINE WITH AURA - MRI brain w/wo - MRA head / neck - check TTE (echocardiogram) - vascular risk factor mgmt   --> aspirin 81mg  + plavix 75mg  x 3 weeks; then take plavix 75mg  daily  --> continue statin, BP control  GAIT DIFFICULTY  - check MRI cervical spine

## 2018-05-30 NOTE — Progress Notes (Signed)
[facility]  PATIENT: [name] DOB: Mar 26, 1946  REFERRING CLINICIAN: My Le HISTORY FROM: patient  REASON FOR VISIT: new consult    HISTORICAL  CHIEF COMPLAINT:  Chief Complaint  Patient presents with  . NP   Dr.  Maryjane Hurter  . R eye vision loss, 05-24-18 temple pain    Opthalmic migraines.  R vision loss lasted 10 minutes.      HISTORY OF PRESENT ILLNESS:   72 year old female here for evaluation of transient visual disturbance and headache.  Patient has history of hyperlipidemia, hypertension, scoliosis, breast cancer, migraine, anxiety.  05/24/2018 patient was at water aerobics, when she felt sudden onset pain in her right temporal region.  This was followed by right eye vision loss.  Then she had "white vision" in her right eye.  Symptoms lasted for 10 minutes and then spontaneously resolved.  No associated nausea, photophobia, numbness, unilateral weakness.  No slurred speech or trouble talking.  Patient has never felt the symptom in her life before.  Patient has history of migraine aura since teenage years consisting of zigzag lines in her vision affecting both eyes, lasting 30 to 40 minutes at a time.  However she never has headache following the symptoms.  No nausea or photophobia.  She has 3 or 4 of these events per year.  Triggering factors seem to be stress letdown.  25 years ago patient had an episode of right body weakness and aphasia lasting for 10 minutes.  Patient went to the hospital for evaluation and had TIA work-up.  Eventually neurologist suspected that these were migraine phenomenon.  Patient also has been having increasing balance and gait difficulty.  She has history of extensive scoliosis surgery from her cervical to thoracic and lumbar spine.  Patient also has history of bilateral hip replacements.   REVIEW OF SYSTEMS: Full 14 system review of systems performed and negative with exception of: Double vision loss of vision headache joint  pain decreased energy.   ALLERGIES: Allergies  Allergen Reactions  . Demerol [Meperidine] Shortness Of Breath, Itching and Nausea Only    Depressed breathing and slurred speach  . Hydrocodone Shortness Of Breath  . Sulfa Antibiotics Hives and Rash  . Tincture Of Benzoin [Benzoin] Itching and Other (See Comments)    Burned skin  . Iodinated Diagnostic Agents Hives    Patient had burning and itching at top of head then broke out into hives on face, neck and head. No breathing issues.   . Dexamethasone Other (See Comments)    Pins and needles, hot, flushed  . Oxycodone Itching and Rash    HOME MEDICATIONS: Outpatient Medications Prior to Visit  Medication Sig Dispense Refill  . acetaminophen (TYLENOL) 500 MG tablet Take 1,000 mg by mouth every 8 (eight) hours as needed for moderate pain.    Marland Kitchen acetaminophen-codeine (TYLENOL #3) 300-30 MG tablet Take 1 tablet by mouth daily as needed for moderate pain.    Marland Kitchen aspirin 81 MG chewable tablet Chew 81 mg by mouth daily.    . Biotin 10000 MCG TABS Take 1 tablet by mouth at bedtime.    . Calcium-Vitamin D-Vitamin K (VIACTIV PO) Take 1 tablet by mouth every morning.     . celecoxib (CELEBREX) 200 MG capsule Take 1 capsule by mouth daily.    . cycloSPORINE (RESTASIS) 0.05 % ophthalmic emulsion Place 1 drop into both eyes 2 (two) times daily.    Marland Kitchen gabapentin (NEURONTIN) 300 MG capsule Take 300 mg by mouth  2 (two) times daily.     . hydrochlorothiazide (HYDRODIURIL) 12.5 MG tablet TAKE 1 TABLET BY MOUTH EVERY DAY IN THE PM  2  . losartan (COZAAR) 50 MG tablet Take 50 mg by mouth daily.  2  . Multiple Vitamins-Minerals (CENTRUM SILVER PO) Take 1 tablet by mouth daily.    . ondansetron (ZOFRAN) 4 MG tablet Take 1 tablet (4 mg total) by mouth every 6 (six) hours. 12 tablet 0  . pantoprazole (PROTONIX) 40 MG tablet Take 40 mg by mouth daily.    . simvastatin (ZOCOR) 40 MG tablet Take 1 tablet (40 mg total) by mouth every evening. 30 tablet 0  . vitamin  C (ASCORBIC ACID) 500 MG tablet Take 500 mg by mouth daily.    Marland Kitchen losartan-hydrochlorothiazide (HYZAAR) 50-12.5 MG per tablet Take 1 tablet by mouth at bedtime.     Marland Kitchen escitalopram (LEXAPRO) 5 MG tablet Take 5 mg by mouth daily.  5   No facility-administered medications prior to visit.     PAST MEDICAL HISTORY: Past Medical History:  Diagnosis Date  . Arthritis   . Arthropathy of right shoulder   . Breast cancer ([place])   . Breast cancer ([place])   . DJD (degenerative joint disease)   . Family history of adverse reaction to anesthesia    " My sister had 2 migrine strokes waking up from her back procedure."  . GERD (gastroesophageal reflux disease)   . Headache(784.0)    migraines  . History of cancer chemotherapy 1996   ALSO RADIATION - BREAST CANCER  . History of skin cancer   . Hx-TIA (transient ischemic attack)    X2  . Hyperlipidemia   . Hypertension   . Osteopenia 02/2018   T score -1.4 distal third of left radius spine and hips excluded due to degenerative changes and hip surgeries  . Personal history of chemotherapy 1997  . Personal history of radiation therapy 1996  . PONV (postoperative nausea and vomiting)    desat during procedure on 10/2015 led oxygen use for 1 month post-op  . Squamous cell carcinoma   . Wears glasses     PAST SURGICAL HISTORY: Past Surgical History:  Procedure Laterality Date  . back fusion  2005, 2008, 2011   T4-S1  . BREAST BIOPSY Left 1996  . BREAST LUMPECTOMY Left 1996  . BUNIONECTOMY Bilateral   . CATARACT EXTRACTION W/ INTRAOCULAR LENS  IMPLANT, BILATERAL    . CHOLECYSTECTOMY    . COLONOSCOPY    . HYSTEROSCOPY W/D&C N/A 08/15/2013   Procedure: DILATATION AND CURETTAGE /HYSTEROSCOPY WITH RESECTION;  Surgeon: Princess Bruins, MD;  Location: [facility];  Service: Gynecology;  Laterality: N/A;  . MULTIPLE TOOTH EXTRACTIONS    . NM MYOVIEW LTD  12/13/2013   Low risk study, no areas of significant ischemia. Mild apical, distal anterolateral  wall perfusion defect is mostly fixed and likely represents attenuation artifact. EF 58%.  Marland Kitchen REVERSE SHOULDER ARTHROPLASTY Right 05/27/2017   Procedure: REVERSE RIGHT SHOULDER ARTHROPLASTY;  Surgeon: Netta Cedars, MD;  Location: [facility];  Service: Orthopedics;  Laterality: Right;  . ROTATOR CUFF REPAIR Right    x 2  . SHOULDER ARTHROSCOPY Left   . THUMB ARTHROSCOPY Right    left, cyst removal  . TONSILLECTOMY     x 4, kept growing back  . TONSILLECTOMY    . TOTAL HIP ARTHROPLASTY Bilateral 1987, [place]   right  x 2  . TOTAL HIP REVISION Left 04/22/2015   Procedure:  LEFT HIP REVISION ACETABULAR COMPONENT;  Surgeon: Rod Can, MD;  Location: [facility];  Service: Orthopedics;  Laterality: Left;    FAMILY HISTORY: Family History  Problem Relation Age of Onset  . Breast cancer Mother 42  . Hypertension Father   . Heart attack Father   . Hyperlipidemia Father   . Colon cancer Father   . Breast cancer Maternal Grandmother 43  . Stroke Maternal Grandfather   . Heart attack Paternal Grandmother   . Cervical cancer Paternal Grandmother   . Heart attack Paternal Grandfather   . Hypertension Sister     SOCIAL HISTORY: Social History   Socioeconomic History  . Marital status: Married    Spouse name: Not on file  . Number of children: Not on file  . Years of education: Not on file  . Highest education level: Not on file  Occupational History  . Occupation: Retired  Scientific laboratory technician  . Financial resource strain: Not on file  . Food insecurity:    Worry: Not on file    Inability: Not on file  . Transportation needs:    Medical: Not on file    Non-medical: Not on file  Tobacco Use  . Smoking status: Never Smoker  . Smokeless tobacco: Never Used  Substance and Sexual Activity  . Alcohol use: Yes    Alcohol/week: 0.0 oz    Comment: rare   . Drug use: No  . Sexual activity: Yes    Partners: Male    Birth control/protection: Post-menopausal    Comment: 1st intercourse- 53,  partners- 86, married- 31 yrs   Lifestyle  . Physical activity:    Days per week: Not on file    Minutes per session: Not on file  . Stress: Not on file  Relationships  . Social connections:    Talks on phone: Not on file    Gets together: Not on file    Attends religious service: Not on file    Active member of club or organization: Not on file    Attends meetings of clubs or organizations: Not on file    Relationship status: Not on file  . Intimate partner violence:    Fear of current or ex partner: Not on file    Emotionally abused: Not on file    Physically abused: Not on file    Forced sexual activity: Not on file  Other Topics Concern  . Not on file  Social History Narrative   Lives in [place] with her husband.     PHYSICAL EXAM  GENERAL EXAM/CONSTITUTIONAL: Vitals:  Vitals:   05/30/18 1312  BP: 121/76  Pulse: 74  Weight: 156 lb (70.8 kg)  Height: 5' (1.524 m)    Body mass index is 30.47 kg/m.  Visual Acuity Screening   Right eye Left eye Both eyes  Without correction: 20/30 20/30   With correction:        Patient is in no distress; well developed, nourished and groomed; neck is supple  CARDIOVASCULAR:  Examination of carotid arteries is normal; no carotid bruits  Regular rate and rhythm, no murmurs  Examination of peripheral vascular system by observation and palpation is normal  EYES:  Ophthalmoscopic exam of optic discs and posterior segments is normal; no papilledema or hemorrhages  MUSCULOSKELETAL:  Gait, strength, tone, movements noted in Neurologic exam below  NEUROLOGIC: MENTAL STATUS:  No flowsheet data found.  awake, alert, oriented to person, place and time  recent and remote memory intact  normal attention and concentration  language fluent, comprehension intact, naming intact,   fund of knowledge appropriate  CRANIAL NERVE:   2nd - no papilledema on fundoscopic exam  2nd, 3rd, 4th, 6th - pupils  equal and reactive to light, visual fields full to confrontation, extraocular muscles intact, no nystagmus  5th - facial sensation symmetric  7th - facial strength symmetric  8th - hearing intact  9th - palate elevates symmetrically, uvula midline  11th - shoulder shrug symmetric  12th - tongue protrusion midline  MOTOR:   normal bulk and tone, full strength in the BUE, BLE  SENSORY:   normal and symmetric to light touch, temperature, vibration; DECR IN LEFT FOOT TO VIB  COORDINATION:   finger-nose-finger, fine finger movements normal  REFLEXES:   deep tendon reflexes 3+ BRISK --> MORE BRISK ON LEFT SIDE  GAIT/STATION:   SCOLIOSIS, CAUTIOUS GAIT; USES 4 POINT CANE    DIAGNOSTIC DATA (LABS, IMAGING, TESTING) - I reviewed patient records, labs, notes, testing and imaging myself where available.  Lab Results  Component Value Date   WBC 4.5 05/24/2018   HGB 14.8 05/24/2018   HCT 43.8 05/24/2018   MCV 89.4 05/24/2018   PLT 217 05/24/2018      Component Value Date/Time   NA 140 05/24/2018 1227   K 3.9 05/24/2018 1227   CL 103 05/24/2018 1227   CO2 29 05/24/2018 1227   GLUCOSE 93 05/24/2018 1227   BUN 21 05/24/2018 1227   CREATININE 0.64 05/24/2018 1227   CALCIUM 9.1 05/24/2018 1227   PROT 7.0 05/24/2018 1227   PROT 6.3 03/25/2014 1406   ALBUMIN 4.2 05/24/2018 1227   ALBUMIN 4.3 03/25/2014 1406   AST 24 05/24/2018 1227   ALT 15 05/24/2018 1227   ALKPHOS 75 05/24/2018 1227   BILITOT 0.9 05/24/2018 1227   GFRNONAA >60 05/24/2018 1227   GFRAA >60 05/24/2018 1227   No results found for: CHOL, HDL, LDLCALC, LDLDIRECT, TRIG, CHOLHDL No results found for: HGBA1C No results found for: VITAMINB12 No results found for: TSH   09/09/15 MRI cervical spine [I reviewed images myself and agree with interpretation. Degenerative changes throughout. -VRP]  1. Increased spondylolisthesis of C7 on T1 due to severe bilateral facet arthritis, now with what appears to be  severe bilateral foraminal stenosis which could affect either or both C8 nerves. 2. Diffuse degenerative disc disease in the remainder of the cervical spine without significant change since the prior study.   05/24/18 CTA head [I reviewed images myself and agree with interpretation. Bilateral ICA stenoses, right worse than left. -VRP]  1. Probable small benign infundibulum of the left Basilar Artery tip. A small 2-3 mm left basilar tip aneurysm is felt less likely.  2. Moderate ICA siphon calcified plaque with moderate Right ICA cavernous stenosis, only mild stenosis on the left. 3. Otherwise negative anterior and posterior circulation. 4. Stable CT appearance of the brain since 1228 hours today. 5. IV contrast reaction consisting of itching and hives while the patient was in the CT department, refer to the ED notes for further details.  04/23/15 TTE - LVEF 55-60%, mild LVH, diastolic dysfunction with normal LV filling pressure, moderate LAE, mildly elevated RA pressure.    ASSESSMENT AND PLAN  72 y.o. year old female here with new onset right-sided headache associated with transient vision loss in the right eye.  Signs and symptoms are concerning more for TIA than migraine.  We will proceed with TIA work-up and vascular risk factor management.  Dx:  1. TIA (transient ischemic attack)   2. 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 MR BRAIN W WO CONTRAST  . MR MRA HEAD WO CONTRAST  . MR MRA NECK W WO CONTRAST  . MR CERVICAL SPINE WO CONTRAST  . ECHOCARDIOGRAM COMPLETE   Meds ordered this encounter    Medications  . clopidogrel (PLAVIX) 75 MG tablet    Sig: Take 1 tablet (75 mg total) by mouth daily.    Dispense:  30 tablet    Refill:  12   Return in about 6 months (around 11/30/2018).  I reviewed images, labs, notes, records myself. I summarized findings and reviewed with patient, for this high risk condition (TIA) requiring high complexity decision making.     Penni Bombard, MD 5/64/3329, 5:18 PM Certified in Neurology, Neurophysiology and Neuroimaging  [facility] [address] [place], [place] [address] [phone]

## 2018-05-30 NOTE — Telephone Encounter (Signed)
[employer] Medicare order sent to GI. No auth they will reach out to the pt to schedule.  °

## 2018-06-02 ENCOUNTER — Ambulatory Visit (HOSPITAL_COMMUNITY): Payer: Medicare Other | Attending: Cardiovascular Disease

## 2018-06-02 ENCOUNTER — Other Ambulatory Visit: Payer: Self-pay

## 2018-06-02 DIAGNOSIS — G459 Transient cerebral ischemic attack, unspecified: Secondary | ICD-10-CM | POA: Diagnosis present

## 2018-06-02 DIAGNOSIS — G3281 Cerebellar ataxia in diseases classified elsewhere: Secondary | ICD-10-CM | POA: Diagnosis not present

## 2018-06-02 DIAGNOSIS — I088 Other rheumatic multiple valve diseases: Secondary | ICD-10-CM | POA: Diagnosis not present

## 2018-06-02 DIAGNOSIS — E785 Hyperlipidemia, unspecified: Secondary | ICD-10-CM | POA: Diagnosis not present

## 2018-06-02 DIAGNOSIS — I10 Essential (primary) hypertension: Secondary | ICD-10-CM | POA: Insufficient documentation

## 2018-06-06 ENCOUNTER — Telehealth: Payer: Self-pay | Admitting: *Deleted

## 2018-06-06 NOTE — Telephone Encounter (Signed)
-----   Message from Penni Bombard, MD sent at 06/05/2018  1:32 PM EDT ----- Unremarkable study. No major findings. Please call patient. Continue current plan. -VRP

## 2018-06-06 NOTE — Telephone Encounter (Signed)
Spoke to pt and relayed that the Echocardiogram results per Dr. Leta Baptist stated unremarkable, no major findings.  I gave her # to GI [phone]  for her MRI. (to schedule).  She verbalized understanding.

## 2018-06-08 ENCOUNTER — Other Ambulatory Visit: Payer: Self-pay | Admitting: Obstetrics & Gynecology

## 2018-06-08 DIAGNOSIS — Z1231 Encounter for screening mammogram for malignant neoplasm of breast: Secondary | ICD-10-CM

## 2018-06-12 ENCOUNTER — Ambulatory Visit
Admission: RE | Admit: 2018-06-12 | Discharge: 2018-06-12 | Disposition: A | Discharge status: Home / Self Care | Payer: Medicare Other | Source: Ambulatory Visit | Attending: Diagnostic Neuroimaging | Admitting: Diagnostic Neuroimaging

## 2018-06-12 DIAGNOSIS — G459 Transient cerebral ischemic attack, unspecified: Secondary | ICD-10-CM

## 2018-06-12 DIAGNOSIS — G3281 Cerebellar ataxia in diseases classified elsewhere: Secondary | ICD-10-CM

## 2018-06-12 MED ORDER — GADOBENATE DIMEGLUMINE 529 MG/ML IV SOLN
14.0000 mL | Freq: Once | INTRAVENOUS | Status: AC | PRN
  Administered 2018-06-12: 14 mL via INTRAVENOUS

## 2018-06-15 ENCOUNTER — Ambulatory Visit
Admission: RE | Admit: 2018-06-15 | Discharge: 2018-06-15 | Disposition: A | Discharge status: Home / Self Care | Payer: Medicare Other | Source: Ambulatory Visit | Attending: Diagnostic Neuroimaging | Admitting: Diagnostic Neuroimaging

## 2018-06-15 DIAGNOSIS — G3281 Cerebellar ataxia in diseases classified elsewhere: Secondary | ICD-10-CM

## 2018-06-15 DIAGNOSIS — G459 Transient cerebral ischemic attack, unspecified: Secondary | ICD-10-CM

## 2018-06-15 MED ORDER — GADOBENATE DIMEGLUMINE 529 MG/ML IV SOLN
14.0000 mL | Freq: Once | INTRAVENOUS | Status: AC | PRN
Start: 2018-06-15 — End: 2018-06-15
  Administered 2018-06-15: 14 mL via INTRAVENOUS

## 2018-06-19 ENCOUNTER — Telehealth: Payer: Self-pay | Admitting: Diagnostic Neuroimaging

## 2018-06-19 NOTE — Telephone Encounter (Signed)
Patient called requesting results of MRI. Please call and advise.

## 2018-06-20 NOTE — Telephone Encounter (Signed)
Spoke with patient and informed her of MRI cervical spine results of post-op changes and no spinal cord compression.  Her MRI brain showed unremarkable imaging results. Her MRA head showed unremarkable imaging results, with mild right ICA narrowing.  Her MRA neck showed overall unremarkable imaging results.  Advised she continue with Dr Gladstone Lighter plan to take medicaitons as ordered, control her BP. She asked if results will be in my chart; advised they will be in a few days. She asked if she can call for any questions; this RN advised she may call. She verbalized understanding, appreciation.

## 2018-07-04 ENCOUNTER — Ambulatory Visit
Admission: RE | Admit: 2018-07-04 | Discharge: 2018-07-04 | Disposition: A | Discharge status: Home / Self Care | Payer: Medicare Other | Source: Ambulatory Visit | Attending: Obstetrics & Gynecology | Admitting: Obstetrics & Gynecology

## 2018-07-04 DIAGNOSIS — Z1231 Encounter for screening mammogram for malignant neoplasm of breast: Secondary | ICD-10-CM

## 2018-07-12 ENCOUNTER — Ambulatory Visit: Payer: Medicare Other | Admitting: Cardiology

## 2018-07-12 ENCOUNTER — Encounter

## 2018-07-12 ENCOUNTER — Encounter: Payer: Self-pay | Admitting: Cardiology

## 2018-07-12 VITALS — BP 114/72 | HR 86 | Ht 60.0 in | Wt 159.0 lb

## 2018-07-12 DIAGNOSIS — R002 Palpitations: Secondary | ICD-10-CM

## 2018-07-12 DIAGNOSIS — G459 Transient cerebral ischemic attack, unspecified: Secondary | ICD-10-CM | POA: Diagnosis not present

## 2018-07-12 NOTE — Patient Instructions (Signed)
Medication Instructions:  The current medical regimen is effective;  continue present plan and medications.  Testing/Procedures: Your physician has recommended that you wear an event monitor for 14 days. Event monitors are medical devices that record the heart's electrical activity. Doctors most often [place] these monitors to diagnose arrhythmias. Arrhythmias are problems with the speed or rhythm of the heartbeat. The monitor is a small, portable device. You can wear one while you do your normal daily activities. This is usually used to diagnose what is causing palpitations/syncope (passing out).  Follow-Up: Follow up as needed after your monitor.  Thank you for choosing [facility]!!

## 2018-07-12 NOTE — Progress Notes (Signed)
Cardiology Office Note:    Date:  07/12/2018   ID:  [name], DOB Feb 18, 1946, MRN [id number]  PCP:  Shirline Frees, MD  Cardiologist:  No primary care provider on file.   Referring MD: Penni Bombard, MD     History of Present Illness:    [name] is a 72 y.o. female with hypertension, hyperlipidemia, obesity with nuclear stress test in 2015 that was low risk, 58% with left hip replacement revision in 2016 here for evaluation of sudden painless vision loss and symptoms of eye stroke in her right eye on Wednesday, May 24, 2018 at water aerobics class.  She remembers that she was having to massage her right temple on the right side of her head due to pain that she was experiencing while she was in the pool during the episode.  She went to go see Dr. Marin Comment, optometrist for further evaluation.  She was sent over to see me to evaluate any cardiovascular risk factors, possibly get carotid Doppler. CT scan in ER was OK.   Dr. Leta Baptist saw.   Interestingly, she does state that 2-3 times per week she will feel her heart race for maybe an hour to at nighttime.  No specific triggers.  She has not been previously diagnosed with atrial fibrillation.  No chest pain fevers chills nausea vomiting syncope.  She does have back pain, spinal issues, suffered a fall in April.  She utilizes a walker currently and Celebrex.  She knows the warnings of Celebrex and Plavix combination.  ECHO 06/02/18: - Left ventricle: The cavity size was normal. Wall thickness was   increased in a pattern of moderate LVH. Systolic function was   normal. The estimated ejection fraction was in the range of 60%   to 65%. Wall motion was normal; there were no regional wall   motion abnormalities. Left ventricular diastolic function   parameters were normal. - Aortic valve: Sclerosis without stenosis. - Atrial septum: No defect or patent foramen ovale was identified.  Past Medical History:  Diagnosis Date  .  Abdominal pain 03/06/2014  . Acid reflux 06/06/2017  . Acute respiratory failure ([place]) 05/20/2015   Onset 04/22/15 sp L THR CTa chest 04/22/15 1. Mildly motion and hardware artifact degraded exam. No evidence of pulmonary embolism. 2. Mild to moderate right hemidiaphragm elevation, progressive since 06/19/2007. Right middle lobe Volume loss and right lower lobe collapse/consolidative change, also favored to represent atelectasis  - 05/21/2015  Walked RA x 3 laps @ 185 ft each stopped due to  End of  . Arthritis   . ARTHRITIS 04/23/2008   Qualifier: Diagnosis of  By: Jerral Ralph    . Arthropathy of right shoulder   . Benign essential HTN 04/23/2008   Qualifier: Diagnosis of  By: Jerral Ralph    . Breast cancer ([place])   . Breast cancer ([place])   . Cervical radiculopathy 10/28/2015  . Chest pain, moderate coronary artery risk 04/22/2015  . 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 DDD (degenerative disc disease) 06/06/2017  . DEEP VENOUS THROMBOPHLEBITIS, HX OF 04/23/2008   Qualifier: Diagnosis of  By: Jerral Ralph    . 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 DEGENERATIVE JOINT DISEASE, SPINE 04/23/2008   Qualifier: Diagnosis of  By: Jerral Ralph    . DIVERTICULOSIS, COLON 12/15/2006   Qualifier: Diagnosis of  By: Jerral Ralph    . DJD (degenerative joint disease)   .  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 Family history of adverse reaction to anesthesia    " My sister had 2 migrine strokes waking up from her back procedure."  . GERD (gastroesophageal reflux disease)   . Headache(784.0)    migraines  . HEADACHE, CHRONIC, HX OF 04/23/2008   Qualifier: Diagnosis of  By: Jerral Ralph    . History of cancer chemotherapy 1996   ALSO RADIATION - BREAST CANCER  . History of skin cancer   . HLD (hyperlipidemia) 06/06/2017  . Hx-TIA (transient ischemic attack)     X2  . Hypercholesterolemia without hypertriglyceridemia 04/23/2008   Qualifier: Diagnosis of  By: Jerral Ralph    . Hyperlipidemia   . Hypertension   . Malignant neoplasm of female breast ([place]) 04/23/2008   Qualifier: Diagnosis of  By: Jerral Ralph    . Obesity 05/21/2015  . Osteopenia 02/2018   T score -1.4 distal third of left radius spine and hips excluded due to degenerative changes and hip surgeries  . Personal history of chemotherapy 1997  . Personal history of radiation therapy 1996  . PONV (postoperative nausea and vomiting)    desat during procedure on 10/2015 led oxygen use for 1 month post-op  . S/P shoulder replacement, right 05/27/2017  . Spondylolisthesis of cervical region 10/28/2015  . Squamous cell carcinoma   . Temporary cerebral vascular dysfunction 04/23/2008   Qualifier: Diagnosis of  By: Jerral Ralph    . Wears glasses     Past Surgical History:  Procedure Laterality Date  . back fusion  2005, 2008, 2011   T4-S1  . BREAST BIOPSY Left 1996  . BREAST LUMPECTOMY Left 1996  . BUNIONECTOMY Bilateral   . CATARACT EXTRACTION W/ INTRAOCULAR LENS  IMPLANT, BILATERAL    . CHOLECYSTECTOMY    . COLONOSCOPY    . HYSTEROSCOPY W/D&C N/A 08/15/2013   Procedure: DILATATION AND CURETTAGE /HYSTEROSCOPY WITH RESECTION;  Surgeon: Princess Bruins, MD;  Location: [facility];  Service: Gynecology;  Laterality: N/A;  . MULTIPLE TOOTH EXTRACTIONS    . NM MYOVIEW LTD  12/13/2013   Low risk study, no areas of significant ischemia. Mild apical, distal anterolateral wall perfusion defect is mostly fixed and likely represents attenuation artifact. EF 58%.  Marland Kitchen REVERSE SHOULDER ARTHROPLASTY Right 05/27/2017   Procedure: REVERSE RIGHT SHOULDER ARTHROPLASTY;  Surgeon: Netta Cedars, MD;  Location: [facility];  Service: Orthopedics;  Laterality: Right;  . ROTATOR CUFF REPAIR Right    x 2  . SHOULDER ARTHROSCOPY Left   . THUMB ARTHROSCOPY Right    left, cyst removal  .  TONSILLECTOMY     x 4, kept growing back  . TONSILLECTOMY    . TOTAL HIP ARTHROPLASTY Bilateral 1987, [place]   right  x 2  . TOTAL HIP REVISION Left 04/22/2015   Procedure: LEFT HIP REVISION ACETABULAR COMPONENT;  Surgeon: Rod Can, MD;  Location: [facility];  Service: Orthopedics;  Laterality: Left;    Current Medications: Current Meds  Medication Sig  . acetaminophen (TYLENOL) 500 MG tablet Take 1,000 mg by mouth every 8 (eight) hours as needed for moderate pain.  Marland Kitchen acetaminophen-codeine (TYLENOL #3) 300-30 MG tablet Take 1 tablet by mouth daily as needed for moderate pain.  . Biotin 10000 MCG TABS Take 1 tablet by mouth at bedtime.  . Calcium-Vitamin D-Vitamin K (VIACTIV PO) Take 1 tablet by mouth every morning.   . celecoxib (CELEBREX) 200 MG capsule Take 1 capsule by mouth daily.  Marland Kitchen  clopidogrel (PLAVIX) 75 MG tablet Take 1 tablet (75 mg total) by mouth daily.  . cycloSPORINE (RESTASIS) 0.05 % ophthalmic emulsion Place 1 drop into both eyes 2 (two) times daily.  Marland Kitchen gabapentin (NEURONTIN) 300 MG capsule Take 300 mg by mouth 2 (two) times daily.   . hydrochlorothiazide (HYDRODIURIL) 12.5 MG tablet TAKE 1 TABLET BY MOUTH EVERY DAY IN THE PM  . losartan (COZAAR) 50 MG tablet Take 50 mg by mouth daily.  . Multiple Vitamins-Minerals (CENTRUM SILVER PO) Take 1 tablet by mouth daily.  . pantoprazole (PROTONIX) 40 MG tablet Take 40 mg by mouth daily.  . simvastatin (ZOCOR) 40 MG tablet Take 1 tablet (40 mg total) by mouth every evening.  . vitamin C (ASCORBIC ACID) 500 MG tablet Take 500 mg by mouth daily.     Allergies:   Demerol [meperidine]; Hydrocodone; Sulfa antibiotics; Tincture of benzoin [benzoin]; Iodinated diagnostic agents; Dexamethasone; and Oxycodone   Social History   Socioeconomic History  . Marital status: Married    Spouse name: Not on file  . Number of children: Not on file  . Years of education: Not on file  . Highest education level: Not on file  Occupational  History  . Occupation: Retired  Scientific laboratory technician  . Financial resource strain: Not on file  . Food insecurity:    Worry: Not on file    Inability: Not on file  . Transportation needs:    Medical: Not on file    Non-medical: Not on file  Tobacco Use  . Smoking status: Never Smoker  . Smokeless tobacco: Never Used  Substance and Sexual Activity  . Alcohol use: Yes    Alcohol/week: 0.0 standard drinks    Comment: rare   . Drug use: No  . Sexual activity: Yes    Partners: Male    Birth control/protection: Post-menopausal    Comment: 1st intercourse- 81, partners- 89, married- 59 yrs   Lifestyle  . Physical activity:    Days per week: Not on file    Minutes per session: Not on file  . Stress: Not on file  Relationships  . Social connections:    Talks on phone: Not on file    Gets together: Not on file    Attends religious service: Not on file    Active member of club or organization: Not on file    Attends meetings of clubs or organizations: Not on file    Relationship status: Not on file  Other Topics Concern  . Not on file  Social History Narrative   Lives in [place] with her husband.     Family History: The patient's family history includes Breast cancer (age of onset: 65) in her mother; Breast cancer (age of onset: 74) in her maternal grandmother; Cervical cancer in her paternal grandmother; Colon cancer in her father; Heart attack in her father, paternal grandfather, and paternal grandmother; Hyperlipidemia in her father; Hypertension in her father and sister; Stroke in her maternal grandfather.  ROS:   Please see the history of present illness.     All other systems reviewed and are negative.  EKGs/Labs/Other Studies Reviewed:    The following studies were reviewed today: Prior medical records, office notes, lab work, echocardiogram, EKG  EKG: EKG from 05/24/2018 personally reviewed shows sinus rhythm with no other abnormalities.  Recent  Labs: 05/24/2018: ALT 15; BUN 21; Creatinine, Ser 0.64; Hemoglobin 14.8; Platelets 217; Potassium 3.9; Sodium 140  Recent Lipid Panel No results found for:  CHOL, TRIG, HDL, CHOLHDL, VLDL, LDLCALC, LDLDIRECT  Physical Exam:    VS:  BP 114/72   Pulse 86   Ht 5' (1.524 m)   Wt 159 lb (72.1 kg)   SpO2 97%   BMI 31.05 kg/m     Wt Readings from Last 3 Encounters:  07/12/18 159 lb (72.1 kg)  05/30/18 156 lb (70.8 kg)  05/24/18 150 lb (68 kg)     GEN:  Well nourished, well developed in no acute distress, antalgic gait HEENT: Normal NECK: No JVD; No carotid bruits LYMPHATICS: No lymphadenopathy CARDIAC: RRR, no murmurs, rubs, gallops RESPIRATORY:  Clear to auscultation without rales, wheezing or rhonchi  ABDOMEN: Soft, non-tender, non-distended MUSCULOSKELETAL:  No edema; No deformity  SKIN: Warm and dry NEUROLOGIC:  Alert and oriented x 3 PSYCHIATRIC:  Normal affect   ASSESSMENT:    1. Palpitations   2. TIA (transient ischemic attack)    PLAN:    In order of problems listed above:  TIA  -I will check a long-term monitor, 3 to 14 days, Zio monitor to make sure that her palpitations felt a couple times per week or not atrial fibrillation.  If it is atrial for ablation, we will need to change her over to Eliquis.  -Agree with use of Plavix only.  She understands potential bleeding risk with Plavix and Celebrex.  -Brain MRI/MRA reviewed, mild right carotid artery stenosis.  No indication for surgery.  Continue with aggressive secondary prevention, currently on high intensity statin, Zocor 40 mg.  Essential hypertension - Continue with current regimen.  Back pain -Understands warnings of Celebrex.  Right eye amaurosis fugax - Appreciate My Le, O.D. expertise.   Medication Adjustments/Labs and Tests Ordered: Current medicines are reviewed at length with the patient today.  Concerns regarding medicines are outlined above.  Orders Placed This Encounter  Procedures  . LONG  TERM MONITOR (3-14 DAYS)   No orders of the defined types were placed in this encounter.   Patient Instructions  Medication Instructions:  The current medical regimen is effective;  continue present plan and medications.  Testing/Procedures: Your physician has recommended that you wear an event monitor for 14 days. Event monitors are medical devices that record the heart's electrical activity. Doctors most often [place] these monitors to diagnose arrhythmias. Arrhythmias are problems with the speed or rhythm of the heartbeat. The monitor is a small, portable device. You can wear one while you do your normal daily activities. This is usually used to diagnose what is causing palpitations/syncope (passing out).  Follow-Up: Follow up as needed after your monitor.  Thank you for choosing [facility]!!        Signed, Candee Furbish, MD  07/12/2018 2:35 PM    [place]

## 2018-07-20 ENCOUNTER — Ambulatory Visit: Payer: Medicare Other | Admitting: Podiatry

## 2018-07-21 ENCOUNTER — Ambulatory Visit (INDEPENDENT_AMBULATORY_CARE_PROVIDER_SITE_OTHER): Payer: Medicare Other

## 2018-07-21 DIAGNOSIS — R002 Palpitations: Secondary | ICD-10-CM

## 2018-07-24 ENCOUNTER — Encounter: Payer: Self-pay | Admitting: Podiatry

## 2018-07-24 ENCOUNTER — Ambulatory Visit: Payer: Medicare Other | Admitting: Podiatry

## 2018-07-24 DIAGNOSIS — M79676 Pain in unspecified toe(s): Secondary | ICD-10-CM | POA: Diagnosis not present

## 2018-07-24 DIAGNOSIS — M2041 Other hammer toe(s) (acquired), right foot: Secondary | ICD-10-CM

## 2018-07-24 DIAGNOSIS — B351 Tinea unguium: Secondary | ICD-10-CM | POA: Diagnosis not present

## 2018-07-24 DIAGNOSIS — M2011 Hallux valgus (acquired), right foot: Secondary | ICD-10-CM

## 2018-07-24 DIAGNOSIS — M2042 Other hammer toe(s) (acquired), left foot: Secondary | ICD-10-CM

## 2018-07-24 DIAGNOSIS — D689 Coagulation defect, unspecified: Secondary | ICD-10-CM

## 2018-07-24 NOTE — Progress Notes (Signed)
Complaint:  Visit Type: Patient returns to my office for continued preventative foot care services. Complaint: Patient states" my nails have grown long and thick and become painful to walk and wear shoes" Patient has prominent forefoot bones both feet. The patient presents for preventative foot care services. No changes to ROS.  Patient is taking plavix.  Podiatric Exam: Vascular: dorsalis pedis and posterior tibial pulses are palpable bilateral. Capillary return is immediate. Temperature gradient is WNL. Skin turgor WNL  Sensorium: Normal Semmes Weinstein monofilament test. Normal tactile sensation bilaterally. Nail Exam: Pt has thick disfigured discolored nails with subungual debris noted bilateral entire nail hallux through fifth toenails Ulcer Exam: There is no evidence of ulcer or pre-ulcerative changes or infection. Orthopedic Exam: Muscle tone and strength are WNL. No limitations in general ROM. No crepitus or effusions noted. 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 No infection or ulcers  Diagnosis:  Onychomycosis, , Pain in right toe, pain in left toes  Treatment & Plan Procedures and Treatment: Consent by patient was obtained for treatment procedures. The patient understood the discussion of treatment and procedures well. All questions were answered thoroughly reviewed. Debridement of mycotic and hypertrophic toenails, 1 through 5 bilateral and clearing of subungual debris. No ulceration, no infection noted.   Return Visit-Office Procedure: Patient instructed to return to the office for a follow up visit 3 months for continued evaluation and treatment.    Latanza Pfefferkorn DPM 

## 2018-08-16 ENCOUNTER — Telehealth: Payer: Self-pay | Admitting: *Deleted

## 2018-08-16 MED ORDER — DILTIAZEM HCL ER COATED BEADS 120 MG PO CP24: 120.0000 mg | ORAL_CAPSULE | Freq: Every day | ORAL | 11 refills | Status: DC

## 2018-08-16 MED ORDER — ATORVASTATIN CALCIUM 40 MG PO TABS: 40.0000 mg | ORAL_TABLET | Freq: Every day | ORAL | 11 refills | Status: DC

## 2018-08-16 NOTE — Telephone Encounter (Signed)
Instead of simvastatin, let us change to atorvastatin 40 mg p.o. once a day. Candee Furbish, MD

## 2018-08-16 NOTE — Telephone Encounter (Signed)
-----   Message from Jerline Pain, MD sent at 08/16/2018  7:21 AM EDT -----  Normal sinus rthyhm with occasional episodes of atrial tachycardia  PAC's noted  No atrial fibrillation, no pauses   Symptomatic fluttering was associated with PAC and atrial tachycardia If she is willing, let's try diltiazem CD 120 mg PO QD to help suppress. Thankfully, no atrial fibrillation found.  Candee Furbish, MD

## 2018-08-16 NOTE — Telephone Encounter (Signed)
Follow up   Patient returning call in reference to results. Please call to discuss.

## 2018-08-16 NOTE — Telephone Encounter (Signed)
Pt aware and will start both Diltiazem and Atorvastatin.  She will d/c the simvastatin. Aware RX for both has been sent into her pharmacy.

## 2018-08-16 NOTE — Telephone Encounter (Signed)
Pt aware of results and is willing to try diltiazem CD 120 mg po daily.  She will c/b to schedule follow up if needed.  In going to send RX electronically it is noted pt is also on simvastatin 40 mg daily.  Will verify with Dr Marlou Porch if he wants pt to take these two medications in combination.

## 2018-08-31 NOTE — Telephone Encounter (Signed)
Neither diltiazem or clopidogrel should cause weight gain. If you feel fine without the diltiazem, I would be okay with you stopping it.  I was giving this to you to help suppress some of the skipped beats, rapid heart rates that you are feeling but this is not 100% necessary.  Candee Furbish, MD

## 2018-10-23 ENCOUNTER — Encounter: Payer: Self-pay | Admitting: Podiatry

## 2018-10-23 ENCOUNTER — Ambulatory Visit: Payer: Medicare Other | Admitting: Podiatry

## 2018-10-23 DIAGNOSIS — M2042 Other hammer toe(s) (acquired), left foot: Secondary | ICD-10-CM

## 2018-10-23 DIAGNOSIS — D689 Coagulation defect, unspecified: Secondary | ICD-10-CM

## 2018-10-23 DIAGNOSIS — M2041 Other hammer toe(s) (acquired), right foot: Secondary | ICD-10-CM

## 2018-10-23 DIAGNOSIS — M2011 Hallux valgus (acquired), right foot: Secondary | ICD-10-CM

## 2018-10-23 DIAGNOSIS — B351 Tinea unguium: Secondary | ICD-10-CM

## 2018-10-23 DIAGNOSIS — M79676 Pain in unspecified toe(s): Secondary | ICD-10-CM | POA: Diagnosis not present

## 2018-10-23 NOTE — Progress Notes (Signed)
Complaint:  Visit Type: Patient returns to my office for continued preventative foot care services. Complaint: Patient states" my nails have grown long and thick and become painful to walk and wear shoes" Patient has prominent forefoot bones both feet. The patient presents for preventative foot care services. No changes to ROS.  Patient is taking plavix. Patient also states she is having throbbing pain in her second toe left foot.  She desires to discuss treatment options. She also has history of previous bunion surgery both feet.  Podiatric Exam: Vascular: dorsalis pedis and posterior tibial pulses are palpable bilateral. Capillary return is immediate. Temperature gradient is WNL. Skin turgor WNL  Sensorium: Normal Semmes Weinstein monofilament test. Normal tactile sensation bilaterally. Nail Exam: Pt has thick disfigured discolored nails with subungual debris noted bilateral entire nail hallux through fifth toenails Ulcer Exam: There is no evidence of ulcer or pre-ulcerative changes or infection. Orthopedic Exam: Muscle tone and strength are WNL. No limitations in general ROM. No crepitus or effusions noted. 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 No infection or ulcers  Diagnosis:  Onychomycosis, , Pain in right toe, pain in left toes  Treatment & Plan Procedures and Treatment: Consent by patient was obtained for treatment procedures. The patient understood the discussion of treatment and procedures well. All questions were answered thoroughly reviewed. Debridement of mycotic and hypertrophic toenails, 1 through 5 bilateral and clearing of subungual debris. No ulceration, no infection noted.  Conservative vs. Surgical correction was discussed.  Dispensed padding for hammer toe second left foot. Return Visit-Office Procedure: Patient instructed to return to the office for a follow up visit 3 months for continued evaluation and treatment.    Gardiner Barefoot DPM

## 2018-12-04 ENCOUNTER — Other Ambulatory Visit: Payer: Self-pay | Admitting: Rehabilitation

## 2018-12-04 ENCOUNTER — Ambulatory Visit: Payer: Medicare Other | Admitting: Diagnostic Neuroimaging

## 2018-12-04 ENCOUNTER — Encounter: Payer: Self-pay | Admitting: Diagnostic Neuroimaging

## 2018-12-04 VITALS — BP 125/79 | HR 65 | Ht 60.0 in | Wt 161.6 lb

## 2018-12-04 DIAGNOSIS — G459 Transient cerebral ischemic attack, unspecified: Secondary | ICD-10-CM

## 2018-12-04 DIAGNOSIS — M4322 Fusion of spine, cervical region: Secondary | ICD-10-CM

## 2018-12-04 DIAGNOSIS — R269 Unspecified abnormalities of gait and mobility: Secondary | ICD-10-CM

## 2018-12-04 NOTE — Progress Notes (Signed)
[facility]  PATIENT: [name] DOB: 1945-12-05  REFERRING CLINICIAN: My Le HISTORY FROM: patient  REASON FOR VISIT: follow up TIA   HISTORICAL  CHIEF COMPLAINT:  Chief Complaint  Patient presents with  . Follow-up    Rm 7,   . Transient Ischemic Attack  . cerebellar ataxia    HISTORY OF PRESENT ILLNESS:   UPDATE (12/04/18, VRP): Since last visit, doing about the same. Symptoms are stable. No alleviating or aggravating factors. Tolerating meds (plavix).  HPI: 73 year old female here for evaluation of transient visual disturbance and headache.  Patient has history of hyperlipidemia, hypertension, scoliosis, breast cancer, migraine, anxiety.  05/24/2018 patient was at water aerobics, when she felt sudden onset pain in her right temporal region.  This was followed by right eye vision loss.  Then she had "white vision" in her right eye.  Symptoms lasted for 10 minutes and then spontaneously resolved.  No associated nausea, photophobia, numbness, unilateral weakness.  No slurred speech or trouble talking.  Patient has never felt the symptom in her life before.  Patient has history of migraine aura since teenage years consisting of zigzag lines in her vision affecting both eyes, lasting 30 to 40 minutes at a time.  However she never has headache following the symptoms.  No nausea or photophobia.  She has 3 or 4 of these events per year.  Triggering factors seem to be stress letdown.  25 years ago patient had an episode of right body weakness and aphasia lasting for 10 minutes.  Patient went to the hospital for evaluation and had TIA work-up.  Eventually neurologist suspected that these were migraine phenomenon.  Patient also has been having increasing balance and gait difficulty.  She has history of extensive scoliosis surgery from her cervical to thoracic and lumbar spine.  Patient also has history of bilateral hip replacements.   REVIEW OF SYSTEMS: Full 14  system review of systems performed and negative with exception of: Double vision loss of vision headache joint pain decreased energy.   ALLERGIES: Allergies  Allergen Reactions  . Demerol [Meperidine] Shortness Of Breath, Itching and Nausea Only    Depressed breathing and slurred speach  . Hydrocodone Shortness Of Breath  . Sulfa Antibiotics Hives and Rash  . Tincture Of Benzoin [Benzoin] Itching and Other (See Comments)    Burned skin  . Iodinated Diagnostic Agents Hives    Patient had burning and itching at top of head then broke out into hives on face, neck and head. No breathing issues.   . Dexamethasone Other (See Comments)    Pins and needles, hot, flushed  . Oxycodone Itching and Rash    HOME MEDICATIONS: Outpatient Medications Prior to Visit  Medication Sig Dispense Refill  . acetaminophen (TYLENOL) 500 MG tablet Take 1,000 mg by mouth every 8 (eight) hours as needed for moderate pain.    Marland Kitchen acetaminophen-codeine (TYLENOL #3) 300-30 MG tablet Take 1 tablet by mouth daily as needed for moderate pain.    Marland Kitchen atorvastatin (LIPITOR) 40 MG tablet Take 1 tablet (40 mg total) by mouth daily. 30 tablet 11  . Biotin 10000 MCG TABS Take 1 tablet by mouth at bedtime.    . Calcium-Vitamin D-Vitamin K (VIACTIV PO) Take 1 tablet by mouth every morning.     . celecoxib (CELEBREX) 200 MG capsule Take 1 capsule by mouth daily.    . clopidogrel (PLAVIX) 75 MG tablet Take 1 tablet (75 mg total) by mouth daily. 30 tablet 12  .  cycloSPORINE (RESTASIS) 0.05 % ophthalmic emulsion Place 1 drop into both eyes 2 (two) times daily.    Marland Kitchen diltiazem (CARDIZEM CD) 120 MG 24 hr capsule Take 1 capsule (120 mg total) by mouth daily. 30 capsule 11  . gabapentin (NEURONTIN) 300 MG capsule Take 300 mg by mouth 2 (two) times daily.     . hydrochlorothiazide (HYDRODIURIL) 12.5 MG tablet TAKE 1 TABLET BY MOUTH EVERY DAY IN THE PM  2  . losartan (COZAAR) 50 MG tablet Take 50 mg by mouth daily.  2  . Multiple  Vitamins-Minerals (CENTRUM SILVER PO) Take 1 tablet by mouth daily.    . pantoprazole (PROTONIX) 40 MG tablet Take 40 mg by mouth daily.    . vitamin C (ASCORBIC ACID) 500 MG tablet Take 500 mg by mouth daily.     No facility-administered medications prior to visit.     PAST MEDICAL HISTORY: Past Medical History:  Diagnosis Date  . Abdominal pain 03/06/2014  . Acid reflux 06/06/2017  . Acute respiratory failure ([place]) 05/20/2015   Onset 04/22/15 sp L THR CTa chest 04/22/15 1. Mildly motion and hardware artifact degraded exam. No evidence of pulmonary embolism. 2. Mild to moderate right hemidiaphragm elevation, progressive since 06/19/2007. Right middle lobe Volume loss and right lower lobe collapse/consolidative change, also favored to represent atelectasis  - 05/21/2015  Walked RA x 3 laps @ 185 ft each stopped due to  End of  . Arthritis   . ARTHRITIS 04/23/2008   Qualifier: Diagnosis of  By: Jerral Ralph    . Arthropathy of right shoulder   . Benign essential HTN 04/23/2008   Qualifier: Diagnosis of  By: Jerral Ralph    . Breast cancer ([place])   . Breast cancer ([place])   . Cervical radiculopathy 10/28/2015  . Chest pain, moderate coronary artery risk 04/22/2015  . 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 DDD (degenerative disc disease) 06/06/2017  . DEEP VENOUS THROMBOPHLEBITIS, HX OF 04/23/2008   Qualifier: Diagnosis of  By: Jerral Ralph    . 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 DEGENERATIVE JOINT DISEASE, SPINE 04/23/2008   Qualifier: Diagnosis of  By: Jerral Ralph    . DIVERTICULOSIS, COLON 12/15/2006   Qualifier: Diagnosis of  By: Jerral Ralph    . DJD (degenerative joint disease)   . 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 Family history of adverse reaction to anesthesia    " My sister had 2 migrine strokes waking up from her back procedure."  . GERD  (gastroesophageal reflux disease)   . Headache(784.0)    migraines  . HEADACHE, CHRONIC, HX OF 04/23/2008   Qualifier: Diagnosis of  By: Jerral Ralph    . History of cancer chemotherapy 1996   ALSO RADIATION - BREAST CANCER  . History of skin cancer   . HLD (hyperlipidemia) 06/06/2017  . Hx-TIA (transient ischemic attack)    X2  . Hypercholesterolemia without hypertriglyceridemia 04/23/2008   Qualifier: Diagnosis of  By: Jerral Ralph    . Hyperlipidemia   . Hypertension   . Malignant neoplasm of female breast ([place]) 04/23/2008   Qualifier: Diagnosis of  By: Jerral Ralph    . Obesity 05/21/2015  . Osteopenia 02/2018   T score -1.4 distal third of left radius spine and hips excluded due to degenerative changes and hip surgeries  . Personal history of chemotherapy 1997  . Personal history of  radiation therapy 1996  . PONV (postoperative nausea and vomiting)    desat during procedure on 10/2015 led oxygen use for 1 month post-op  . S/P shoulder replacement, right 05/27/2017  . Spondylolisthesis of cervical region 10/28/2015  . Squamous cell carcinoma   . Temporary cerebral vascular dysfunction 04/23/2008   Qualifier: Diagnosis of  By: Jerral Ralph    . Wears glasses     PAST SURGICAL HISTORY: Past Surgical History:  Procedure Laterality Date  . back fusion  2005, 2008, 2011   T4-S1  . BREAST BIOPSY Left 1996  . BREAST LUMPECTOMY Left 1996  . BUNIONECTOMY Bilateral   . CATARACT EXTRACTION W/ INTRAOCULAR LENS  IMPLANT, BILATERAL    . CHOLECYSTECTOMY    . COLONOSCOPY    . HYSTEROSCOPY W/D&C N/A 08/15/2013   Procedure: DILATATION AND CURETTAGE /HYSTEROSCOPY WITH RESECTION;  Surgeon: Princess Bruins, MD;  Location: [facility];  Service: Gynecology;  Laterality: N/A;  . MULTIPLE TOOTH EXTRACTIONS    . NM MYOVIEW LTD  12/13/2013   Low risk study, no areas of significant ischemia. Mild apical, distal anterolateral wall perfusion defect is mostly fixed and  likely represents attenuation artifact. EF 58%.  Marland Kitchen REVERSE SHOULDER ARTHROPLASTY Right 05/27/2017   Procedure: REVERSE RIGHT SHOULDER ARTHROPLASTY;  Surgeon: Netta Cedars, MD;  Location: [facility];  Service: Orthopedics;  Laterality: Right;  . ROTATOR CUFF REPAIR Right    x 2  . SHOULDER ARTHROSCOPY Left   . THUMB ARTHROSCOPY Right    left, cyst removal  . TONSILLECTOMY     x 4, kept growing back  . TONSILLECTOMY    . TOTAL HIP ARTHROPLASTY Bilateral 1987, [place]   right  x 2  . TOTAL HIP REVISION Left 04/22/2015   Procedure: LEFT HIP REVISION ACETABULAR COMPONENT;  Surgeon: Rod Can, MD;  Location: [facility];  Service: Orthopedics;  Laterality: Left;    FAMILY HISTORY: Family History  Problem Relation Age of Onset  . Breast cancer Mother 61  . Hypertension Father   . Heart attack Father   . Hyperlipidemia Father   . Colon cancer Father   . Breast cancer Maternal Grandmother 10  . Stroke Maternal Grandfather   . Heart attack Paternal Grandmother   . Cervical cancer Paternal Grandmother   . Heart attack Paternal Grandfather   . Hypertension Sister     SOCIAL HISTORY: Social History   Socioeconomic History  . Marital status: Married    Spouse name: Not on file  . Number of children: Not on file  . Years of education: Not on file  . Highest education level: Not on file  Occupational History  . Occupation: Retired  Scientific laboratory technician  . Financial resource strain: Not on file  . Food insecurity:    Worry: Not on file    Inability: Not on file  . Transportation needs:    Medical: Not on file    Non-medical: Not on file  Tobacco Use  . Smoking status: Never Smoker  . Smokeless tobacco: Never Used  Substance and Sexual Activity  . Alcohol use: Yes    Alcohol/week: 0.0 standard drinks    Comment: rare   . Drug use: No  . Sexual activity: Yes    Partners: Male    Birth control/protection: Post-menopausal    Comment: 1st intercourse- 49, partners- 25, married- 56 yrs     Lifestyle  . Physical activity:    Days per week: Not on file    Minutes per session: Not  on file  . Stress: Not on file  Relationships  . Social connections:    Talks on phone: Not on file    Gets together: Not on file    Attends religious service: Not on file    Active member of club or organization: Not on file    Attends meetings of clubs or organizations: Not on file    Relationship status: Not on file  . Intimate partner violence:    Fear of current or ex partner: Not on file    Emotionally abused: Not on file    Physically abused: Not on file    Forced sexual activity: Not on file  Other Topics Concern  . Not on file  Social History Narrative   Lives in [place] with her husband.     PHYSICAL EXAM  GENERAL EXAM/CONSTITUTIONAL: Vitals:  Vitals:   12/04/18 1408  BP: 125/79  Pulse: 65  Weight: 161 lb 9.6 oz (73.3 kg)  Height: 5' (1.524 m)   Wt Readings from Last 3 Encounters:  12/04/18 161 lb 9.6 oz (73.3 kg)  07/12/18 159 lb (72.1 kg)  05/30/18 156 lb (70.8 kg)    Body mass index is 31.05 kg/m. No exam data present  Patient is in no distress; well developed, nourished and groomed; neck is supple  CARDIOVASCULAR:  Examination of carotid arteries is normal; no carotid bruits  Regular rate and rhythm, no murmurs  Examination of peripheral vascular system by observation and palpation is normal  EYES:  Ophthalmoscopic exam of optic discs and posterior segments is normal; no papilledema or hemorrhages  MUSCULOSKELETAL:  Gait, strength, tone, movements noted in Neurologic exam below  NEUROLOGIC: MENTAL STATUS:  No flowsheet data found.  awake, alert, oriented to person, place and time  recent and remote memory intact  normal attention and concentration  language fluent, comprehension intact, naming intact,   fund of knowledge appropriate  CRANIAL NERVE:   2nd - no papilledema on fundoscopic exam  2nd, 3rd, 4th, 6th -  pupils equal and reactive to light, visual fields full to confrontation, extraocular muscles intact, no nystagmus; SUBJECTIVE DOUBLE VISION ON LEFT GAZE  5th - facial sensation symmetric  7th - facial strength symmetric  8th - hearing intact  9th - palate elevates symmetrically, uvula midline  11th - shoulder shrug symmetric  12th - tongue protrusion midline  MOTOR:   normal bulk and tone, full strength in the BUE, BLE  SENSORY:   normal and symmetric to light touch, temperature, vibration; DECR IN LEFT FOOT TO TEMP; ABSENT VIB AT TOES  COORDINATION:   finger-nose-finger, fine finger movements normal  REFLEXES:   deep tendon reflexes --> RUE 1-2; LUE 2-3; KNEE 2++; ANKLES 1  GAIT/STATION:   SCOLIOSIS, CAUTIOUS GAIT; USES 4 POINT CANE    DIAGNOSTIC DATA (LABS, IMAGING, TESTING) - I reviewed patient records, labs, notes, testing and imaging myself where available.  Lab Results  Component Value Date   WBC 4.5 05/24/2018   HGB 14.8 05/24/2018   HCT 43.8 05/24/2018   MCV 89.4 05/24/2018   PLT 217 05/24/2018      Component Value Date/Time   NA 140 05/24/2018 1227   K 3.9 05/24/2018 1227   CL 103 05/24/2018 1227   CO2 29 05/24/2018 1227   GLUCOSE 93 05/24/2018 1227   BUN 21 05/24/2018 1227   CREATININE 0.64 05/24/2018 1227   CALCIUM 9.1 05/24/2018 1227   PROT 7.0 05/24/2018 1227   PROT 6.3 03/25/2014 1406  ALBUMIN 4.2 05/24/2018 1227   ALBUMIN 4.3 03/25/2014 1406   AST 24 05/24/2018 1227   ALT 15 05/24/2018 1227   ALKPHOS 75 05/24/2018 1227   BILITOT 0.9 05/24/2018 1227   GFRNONAA >60 05/24/2018 1227   GFRAA >60 05/24/2018 1227   No results found for: CHOL, HDL, LDLCALC, LDLDIRECT, TRIG, CHOLHDL No results found for: HGBA1C No results found for: VITAMINB12 No results found for: TSH   09/09/15 MRI cervical spine [I reviewed images myself and agree with interpretation. Degenerative changes throughout. -VRP]  1. Increased spondylolisthesis of C7 on T1  due to severe bilateral facet arthritis, now with what appears to be severe bilateral foraminal stenosis which could affect either or both C8 nerves. 2. Diffuse degenerative disc disease in the remainder of the cervical spine without significant change since the prior study.  05/24/18 CTA head [I reviewed images myself and agree with interpretation. Bilateral ICA stenoses, right worse than left. -VRP]  1. Probable small benign infundibulum of the left Basilar Artery tip. A small 2-3 mm left basilar tip aneurysm is felt less likely.  2. Moderate ICA siphon calcified plaque with moderate Right ICA cavernous stenosis, only mild stenosis on the left. 3. Otherwise negative anterior and posterior circulation. 4. Stable CT appearance of the brain since 1228 hours today. 5. IV contrast reaction consisting of itching and hives while the patient was in the CT department, refer to the ED notes for further details.  04/23/15 TTE - LVEF 55-60%, mild LVH, diastolic dysfunction with normal LV filling pressure, moderate LAE, mildly elevated RA pressure.  06/12/2018 MRI Cervical Spine w/o 1. C3-4: uncovertebral joint hypertrophy and facet hypertrophy with moderate left foraminal stenosis. 2. C4-5: uncovertebral joint hypertrophy and facet hypertrophy with severe right and moderate left foraminal stenosis. 3. Multilevel interbody fusion with metal hardware, extending from C3 down to T3 levels.  06/12/2018 MRI Brain w/wo 1. Mild chronic small vessel ischemic disease. 2. No acute findings.  06/15/18 MR angiogram of the intracranial arteries shows the following: 1.    Mild stenosis of the cavernous segment of the right internal carotid artery 2.    Incidental note is made of normal variant anatomy as detailed above.  06/15/18 MR angiogram of the neck arteries shows the following: 1.    Focal flow defect in the left subclavian artery that could represent stenosis or artifact..   2.    There is mild stenosis of the  cavernous segment of the right internal carotid artery. 3.    Variant normal anatomy involving the left vertebral artery and the left posterior cerebral artery.     ASSESSMENT AND PLAN  73 y.o. year old female here with new onset right-sided headache associated with transient vision loss in the right eye.  Signs and symptoms are concerning more for TIA than migraine.    Dx:  1. TIA (transient ischemic attack)   2. Gait difficulty     PLAN:  TIA (vs migraine less likely) - vascular risk factor mgmt   --> continue plavix 75mg  daily  --> continue statin, BP control  GAIT DIFFICULTY - continue PT exercises  Return for pending if symptoms worsen or fail to improve.     Penni Bombard, MD 8/85/0277, 4:12 PM Certified in Neurology, Neurophysiology and Neuroimaging  [facility] [address], [place], [place] [address] [phone]

## 2018-12-10 ENCOUNTER — Ambulatory Visit
Admission: RE | Admit: 2018-12-10 | Discharge: 2018-12-10 | Disposition: A | Discharge status: Home / Self Care | Payer: Medicare Other | Source: Ambulatory Visit | Attending: Rehabilitation | Admitting: Rehabilitation

## 2018-12-10 DIAGNOSIS — M4322 Fusion of spine, cervical region: Secondary | ICD-10-CM

## 2018-12-14 ENCOUNTER — Encounter: Payer: Self-pay | Admitting: *Deleted

## 2019-01-22 ENCOUNTER — Ambulatory Visit: Payer: Medicare Other | Admitting: Podiatry

## 2019-01-25 ENCOUNTER — Encounter: Payer: Self-pay | Admitting: Podiatry

## 2019-01-25 ENCOUNTER — Ambulatory Visit (INDEPENDENT_AMBULATORY_CARE_PROVIDER_SITE_OTHER): Payer: Medicare Other | Admitting: Podiatry

## 2019-01-25 ENCOUNTER — Other Ambulatory Visit: Payer: Self-pay

## 2019-01-25 DIAGNOSIS — M79676 Pain in unspecified toe(s): Secondary | ICD-10-CM | POA: Diagnosis not present

## 2019-01-25 DIAGNOSIS — D689 Coagulation defect, unspecified: Secondary | ICD-10-CM | POA: Diagnosis not present

## 2019-01-25 DIAGNOSIS — B351 Tinea unguium: Secondary | ICD-10-CM | POA: Diagnosis not present

## 2019-01-25 NOTE — Progress Notes (Signed)
Complaint:  Visit Type: Patient returns to my office for continued preventative foot care services. Complaint: Patient states" my nails have grown long and thick and become painful to walk and wear shoes" Patient has prominent forefoot bones both feet. The patient presents for preventative foot care services. No changes to ROS.  Patient is taking plavix.  Podiatric Exam: Vascular: dorsalis pedis and posterior tibial pulses are palpable bilateral. Capillary return is immediate. Temperature gradient is WNL. Skin turgor WNL  Sensorium: Normal Semmes Weinstein monofilament test. Normal tactile sensation bilaterally. Nail Exam: Pt has thick disfigured discolored nails with subungual debris noted bilateral entire nail hallux through fifth toenails Ulcer Exam: There is no evidence of ulcer or pre-ulcerative changes or infection. Orthopedic Exam: Muscle tone and strength are WNL. No limitations in general ROM. No crepitus or effusions noted. 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 No infection or ulcers  Diagnosis:  Onychomycosis, , Pain in right toe, pain in left toes  Treatment & Plan Procedures and Treatment: Consent by patient was obtained for treatment procedures. The patient understood the discussion of treatment and procedures well. All questions were answered thoroughly reviewed. Debridement of mycotic and hypertrophic toenails, 1 through 5 bilateral and clearing of subungual debris. No ulceration, no infection noted.   Return Visit-Office Procedure: Patient instructed to return to the office for a follow up visit 3 months for continued evaluation and treatment.    Gardiner Barefoot DPM

## 2019-04-19 ENCOUNTER — Ambulatory Visit: Payer: Medicare Other | Admitting: Podiatry

## 2019-04-23 ENCOUNTER — Other Ambulatory Visit: Payer: Self-pay

## 2019-04-23 ENCOUNTER — Encounter: Payer: Self-pay | Admitting: Podiatry

## 2019-04-23 ENCOUNTER — Ambulatory Visit: Payer: Medicare Other | Admitting: Podiatry

## 2019-04-23 DIAGNOSIS — D689 Coagulation defect, unspecified: Secondary | ICD-10-CM | POA: Diagnosis not present

## 2019-04-23 DIAGNOSIS — B351 Tinea unguium: Secondary | ICD-10-CM

## 2019-04-23 DIAGNOSIS — M79675 Pain in left toe(s): Secondary | ICD-10-CM

## 2019-04-23 DIAGNOSIS — M2041 Other hammer toe(s) (acquired), right foot: Secondary | ICD-10-CM | POA: Diagnosis not present

## 2019-04-23 DIAGNOSIS — M2042 Other hammer toe(s) (acquired), left foot: Secondary | ICD-10-CM

## 2019-04-23 DIAGNOSIS — M79674 Pain in right toe(s): Secondary | ICD-10-CM

## 2019-04-23 NOTE — Progress Notes (Signed)
Complaint:  Visit Type: Patient returns to my office for continued preventative foot care services. Complaint: Patient states" my nails have grown long and thick and become painful to walk and wear shoes" Patient has prominent forefoot bones both feet. The patient presents for preventative foot care services.  Patient says she is occasional severe pain in second toe left foot.  She wishes to discuss surgical treatment.   No changes to ROS.  Patient is taking plavix.  Podiatric Exam: Vascular: dorsalis pedis and posterior tibial pulses are palpable bilateral. Capillary return is immediate. Temperature gradient is WNL. Skin turgor WNL  Sensorium: Normal Semmes Weinstein monofilament test. Normal tactile sensation bilaterally. Nail Exam: Pt has thick disfigured discolored nails with subungual debris noted bilateral entire nail hallux through fifth toenails Ulcer Exam: There is no evidence of ulcer or pre-ulcerative changes or infection. Orthopedic Exam: Muscle tone and strength are WNL. No limitations in general ROM. No crepitus or effusions noted. 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  Capsulitis sub 2 left foot. Skin: No Porokeratosis. No infection or ulcers  Diagnosis:  Onychomycosis, , Pain in right toe, pain in left toes,  Hammer toe second left foot. Capsulitis 2nd MPJ left.  Treatment & Plan Procedures and Treatment: Consent by patient was obtained for treatment procedures. The patient understood the discussion of treatment and procedures well. All questions were answered thoroughly reviewed. Debridement of mycotic and hypertrophic toenails, 1 through 5 bilateral and clearing of subungual debris. No ulceration, no infection noted.  Patient was given an appointment with Dr.  Milinda Pointer for surgical consultation. She states she has more pain from her capsulitis sub 2 left than her hammer toe. Return Visit-Office Procedure: Patient instructed to return to the office  for a follow up visit 3 months for preventative foot care services.    Gardiner Barefoot DPM

## 2019-05-24 ENCOUNTER — Other Ambulatory Visit: Payer: Self-pay | Admitting: Obstetrics & Gynecology

## 2019-05-24 DIAGNOSIS — Z1231 Encounter for screening mammogram for malignant neoplasm of breast: Secondary | ICD-10-CM

## 2019-05-30 ENCOUNTER — Ambulatory Visit: Payer: Medicare Other | Admitting: Podiatry

## 2019-06-03 ENCOUNTER — Other Ambulatory Visit: Payer: Self-pay | Admitting: Diagnostic Neuroimaging

## 2019-06-04 NOTE — Telephone Encounter (Signed)
Plavix refilled x 3 months with note to pharmacy: future refills with PCP. Patient was released to PCP in Jan 2020, return as needed.

## 2019-07-10 ENCOUNTER — Other Ambulatory Visit: Payer: Self-pay

## 2019-07-10 ENCOUNTER — Ambulatory Visit
Admission: RE | Admit: 2019-07-10 | Discharge: 2019-07-10 | Disposition: A | Discharge status: Home / Self Care | Payer: Medicare Other | Source: Ambulatory Visit | Attending: Obstetrics & Gynecology | Admitting: Obstetrics & Gynecology

## 2019-07-10 ENCOUNTER — Other Ambulatory Visit: Payer: Self-pay | Admitting: Obstetrics & Gynecology

## 2019-07-10 DIAGNOSIS — N63 Unspecified lump in unspecified breast: Secondary | ICD-10-CM

## 2019-07-10 DIAGNOSIS — Z1231 Encounter for screening mammogram for malignant neoplasm of breast: Secondary | ICD-10-CM

## 2019-07-10 DIAGNOSIS — N644 Mastodynia: Secondary | ICD-10-CM

## 2019-07-20 ENCOUNTER — Other Ambulatory Visit: Payer: Self-pay

## 2019-07-20 ENCOUNTER — Ambulatory Visit
Admission: RE | Admit: 2019-07-20 | Discharge: 2019-07-20 | Disposition: A | Discharge status: Home / Self Care | Payer: Medicare Other | Source: Ambulatory Visit | Attending: Obstetrics & Gynecology | Admitting: Obstetrics & Gynecology

## 2019-07-20 DIAGNOSIS — N63 Unspecified lump in unspecified breast: Secondary | ICD-10-CM

## 2019-07-20 DIAGNOSIS — N644 Mastodynia: Secondary | ICD-10-CM

## 2019-07-23 ENCOUNTER — Other Ambulatory Visit: Payer: Self-pay

## 2019-07-23 ENCOUNTER — Ambulatory Visit (INDEPENDENT_AMBULATORY_CARE_PROVIDER_SITE_OTHER): Payer: Medicare Other | Admitting: Podiatry

## 2019-07-23 ENCOUNTER — Encounter: Payer: Self-pay | Admitting: Podiatry

## 2019-07-23 DIAGNOSIS — B351 Tinea unguium: Secondary | ICD-10-CM | POA: Diagnosis not present

## 2019-07-23 DIAGNOSIS — D689 Coagulation defect, unspecified: Secondary | ICD-10-CM | POA: Diagnosis not present

## 2019-07-23 DIAGNOSIS — M779 Enthesopathy, unspecified: Secondary | ICD-10-CM | POA: Diagnosis not present

## 2019-07-23 DIAGNOSIS — M79674 Pain in right toe(s): Secondary | ICD-10-CM | POA: Diagnosis not present

## 2019-07-23 DIAGNOSIS — M79675 Pain in left toe(s): Secondary | ICD-10-CM | POA: Diagnosis not present

## 2019-07-23 NOTE — Progress Notes (Signed)
Complaint:  Visit Type: Patient returns to my office for continued preventative foot care services. Complaint: Patient states" my nails have grown long and thick and become painful to walk and wear shoes" Patient has prominent forefoot bones both feet. The patient presents for preventative foot care services.  Patient says she is occasional severe pain in second toe left foot.  She wishes to discuss surgical treatment.   No changes to ROS.  Patient is taking plavix.  Podiatric Exam: Vascular: dorsalis pedis and posterior tibial pulses are palpable bilateral. Capillary return is immediate. Temperature gradient is WNL. Skin turgor WNL  Sensorium: Normal Semmes Weinstein monofilament test. Normal tactile sensation bilaterally. Nail Exam: Pt has thick disfigured discolored nails with subungual debris noted bilateral entire nail hallux through fifth toenails Ulcer Exam: There is no evidence of ulcer or pre-ulcerative changes or infection. Orthopedic Exam: Muscle tone and strength are WNL. No limitations in general ROM. No crepitus or effusions noted. 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  Capsulitis sub 2 left foot. Skin: No Porokeratosis. No infection or ulcers  Diagnosis:  Onychomycosis, , Pain in right toe, pain in left toes,  Hammer toe second left foot. Capsulitis 2nd MPJ left.  Treatment & Plan Procedures and Treatment: Consent by patient was obtained for treatment procedures. The patient understood the discussion of treatment and procedures well. All questions were answered thoroughly reviewed. Debridement of mycotic and hypertrophic toenails, 1 through 5 bilateral and clearing of subungual debris. No ulceration, no infection noted.  Patient was given an appointment with Dr.  Milinda Pointer for surgical consultation. She states she has more pain from her capsulitis sub 2 left than her hammer toe.  Patient says she will keep this appointment with Dr.  Milinda Pointer. Return  Visit-Office Procedure: Patient instructed to return to the office for a follow up visit 3 months for preventative foot care services.    Gardiner Barefoot DPM

## 2019-08-06 ENCOUNTER — Other Ambulatory Visit: Payer: Self-pay

## 2019-08-06 ENCOUNTER — Encounter: Payer: Self-pay | Admitting: Podiatry

## 2019-08-06 ENCOUNTER — Ambulatory Visit (INDEPENDENT_AMBULATORY_CARE_PROVIDER_SITE_OTHER): Payer: Medicare Other

## 2019-08-06 ENCOUNTER — Ambulatory Visit (INDEPENDENT_AMBULATORY_CARE_PROVIDER_SITE_OTHER): Payer: Medicare Other | Admitting: Podiatry

## 2019-08-06 DIAGNOSIS — M2042 Other hammer toe(s) (acquired), left foot: Secondary | ICD-10-CM

## 2019-08-06 NOTE — Progress Notes (Signed)
She presents today for surgical consult of her painful capsulitis second metatarsal phalangeal joint left foot.  Objective: Vital signs are stable alert and oriented x3.  Pulses are palpable.  Neurologic sensorium is intact she has a hammertoe deformity of the second left with hallux valgus deformity notable.  She has pain on palpation of the attempted room range of motion of the second metatarsal phalangeal joint left foot.  Radiographs taken today do demonstrate bone to bone contact.  Assessment: Capsulitis osteoarthritis second metatarsophalangeal joint.  Plan: An isolated second metatarsal osteotomy left foot.  We consented her for this today she understands the pros and cons of the surgery.  She understands that she will be wearing a cam walker for a period of up to 4 weeks afterwards possibly longer depending on healing.  We did discuss the possible postop complications which may include but not limited to postop pain bleeding swelling infection recurrence need for further surgery overcorrection under correction loss of digit loss of limb loss of life.  Provided her with information regarding the surgery center anesthesia group as well as information regarding the morning of surgery.

## 2019-08-06 NOTE — Patient Instructions (Signed)
Pre-Operative Instructions  Congratulations, you have decided to take an important step towards improving your quality of life.  You can be assured that the doctors and staff at [facility] will be with you every step of the way.  Here are some important things you should know:  1. Plan to be at the surgery center/hospital at least 1 (one) hour prior to your scheduled time, unless otherwise directed by the surgical center/hospital staff.  You must have a responsible adult accompany you, remain during the surgery and drive you home.  Make sure you have directions to the surgical center/hospital to ensure you arrive on time. 2. If you are having surgery at [facility] or [facility], you will need a copy of your medical history and physical form from your family physician within one month prior to the date of surgery. We will give you a form for your primary physician to complete.  3. We make every effort to accommodate the date you request for surgery.  However, there are times where surgery dates or times have to be moved.  We will contact you as soon as possible if a change in schedule is required.   4. No aspirin/ibuprofen for one week before surgery.  If you are on aspirin, any non-steroidal anti-inflammatory medications (Mobic, Aleve, Ibuprofen) should not be taken seven (7) days prior to your surgery.  You make take Tylenol for pain prior to surgery.  5. Medications - If you are taking daily heart and blood pressure medications, seizure, reflux, allergy, asthma, anxiety, pain or diabetes medications, make sure you notify the surgery center/hospital before the day of surgery so they can tell you which medications you should take or avoid the day of surgery. 6. No food or drink after midnight the night before surgery unless directed otherwise by surgical center/hospital staff. 7. No alcoholic beverages 24-hours prior to surgery.  No smoking 24-hours prior or 24-hours after  surgery. 8. Wear loose pants or shorts. They should be loose enough to fit over bandages, boots, and casts. 9. Don't wear slip-on shoes. Sneakers are preferred. 10. Bring your boot with you to the surgery center/hospital.  Also bring crutches or a walker if your physician has prescribed it for you.  If you do not have this equipment, it will be provided for you after surgery. 11. If you have not been contacted by the surgery center/hospital by the day before your surgery, call to confirm the date and time of your surgery. 12. Leave-time from work may vary depending on the type of surgery you have.  Appropriate arrangements should be made prior to surgery with your employer. 13. Prescriptions will be provided immediately following surgery by your doctor.  Fill these as soon as possible after surgery and take the medication as directed. Pain medications will not be refilled on weekends and must be approved by the doctor. 14. Remove nail polish on the operative foot and avoid getting pedicures prior to surgery. 15. Wash the night before surgery.  The night before surgery wash the foot and leg well with water and the antibacterial soap provided. Be sure to pay special attention to beneath the toenails and in between the toes.  Wash for at least three (3) minutes. Rinse thoroughly with water and dry well with a towel.  Perform this wash unless told not to do so by your physician.  Enclosed: 1 Ice pack (please put in freezer the night before surgery)   1 Hibiclens skin cleaner     Pre-op instructions  If you have any questions regarding the instructions, please do not hesitate to call our office.  [place]: [address], [place], [place] [address] -- [phone]  [place]: [address] [place], [place] [address] -- [phone]  [place]: [address]  , [place] [address] -- [phone]   Website: [email or web address] 

## 2019-08-07 ENCOUNTER — Telehealth: Payer: Self-pay | Admitting: *Deleted

## 2019-08-07 NOTE — Telephone Encounter (Signed)
"  I am calling because Dr. Milinda Pointer would like me to schedule surgery on my foot.  Please give me a call back."

## 2019-08-09 ENCOUNTER — Telehealth: Payer: Self-pay | Admitting: *Deleted

## 2019-08-09 NOTE — Telephone Encounter (Signed)
Pt states she was seen in [place] on Monday and the boot she was given dislocated her elbow when she was going out to the car and she would like a alternate to use until her surgery. Pt states she would like a call from the surgery coordinator.

## 2019-08-10 NOTE — Telephone Encounter (Signed)
Called patient and left message to call back.

## 2019-08-15 ENCOUNTER — Encounter: Payer: Self-pay | Admitting: Gynecology

## 2019-08-20 ENCOUNTER — Other Ambulatory Visit: Payer: Self-pay | Admitting: Cardiology

## 2019-08-21 ENCOUNTER — Encounter: Payer: Self-pay | Admitting: *Deleted

## 2019-08-21 NOTE — Progress Notes (Signed)
Per Dr. Milinda Pointer I sent a surgical medical clearance request letter to Dr. Shirline Frees and Dr. Elta Guadeloupe Clearance

## 2019-08-21 NOTE — Telephone Encounter (Signed)
I am returning your all.  We are waiting on medical clearance from Dr. Kenton Kingfisher and Dr. Marlou Porch.  "I wonder what's taking so long.  I wonder if I need to schedule an appointment with them?"  They will let us know or let you know if an appointment is needed.  "Is he pretty booked out?"  His schedule is pretty open at this time.  "Oh, good!"

## 2019-08-21 NOTE — Telephone Encounter (Signed)
"  I'm trying to schedule a foot surgery with Dr. Milinda Pointer."

## 2019-08-24 NOTE — Progress Notes (Signed)
I have the authorization you laid on my desk from Dr. Candee Furbish.

## 2019-08-30 ENCOUNTER — Encounter: Payer: Self-pay | Admitting: Podiatry

## 2019-09-02 ENCOUNTER — Other Ambulatory Visit: Payer: Self-pay | Admitting: Diagnostic Neuroimaging

## 2019-09-12 ENCOUNTER — Telehealth: Payer: Self-pay | Admitting: Diagnostic Neuroimaging

## 2019-09-12 NOTE — Telephone Encounter (Signed)
Called patient and informed her that when she saw Dr Leta Baptist in Jan 2020 he released her back to her PCP. She may be seen here on as needed basis. She stated she was unaware of that, and she will call her PCP. Patient verbalized understanding, appreciation.

## 2019-09-12 NOTE — Telephone Encounter (Signed)
Pt is needing a refill on her clopidogrel (PLAVIX) 75 MG tablet sent to the [facility] in [place].

## 2019-09-13 ENCOUNTER — Other Ambulatory Visit: Payer: Self-pay | Admitting: Cardiology

## 2019-09-17 ENCOUNTER — Encounter: Payer: Self-pay | Admitting: Podiatry

## 2019-09-17 ENCOUNTER — Ambulatory Visit: Payer: Medicare Other | Admitting: Podiatry

## 2019-09-17 ENCOUNTER — Other Ambulatory Visit: Payer: Self-pay

## 2019-09-17 DIAGNOSIS — M2042 Other hammer toe(s) (acquired), left foot: Secondary | ICD-10-CM | POA: Diagnosis not present

## 2019-09-17 NOTE — Progress Notes (Signed)
She presents today for follow-up and a redo of her surgical consult.  States my entire forefoot is hurting and she took into account what we discussed last time he states I like to just go ahead and get all of this fixed rather than just the second metatarsal phalangeal joint.  Objective: Vital signs are stable alert and oriented x3 pulses remain strong and palpable left.  Still has thick second metatarsophalangeal joint painful on palpation and attempted range of motion.  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  She also has hammertoe deformity third toe and fourth toe as well as fifth toe.  Assessment: At this point we reconsented her today for second metatarsal osteotomy and [place] bunion repair hammertoe repair [phone] with pins #5 without pin.  Discussed surgery in great detail.  Signed all 3 page of the consent form follow-up with her December.

## 2019-09-19 ENCOUNTER — Other Ambulatory Visit: Payer: Self-pay | Admitting: Cardiology

## 2019-09-19 MED ORDER — DILTIAZEM HCL ER COATED BEADS 120 MG PO CP24: 120.0000 mg | ORAL_CAPSULE | Freq: Every day | ORAL | 0 refills | Status: DC

## 2019-09-19 NOTE — Telephone Encounter (Signed)
Pt's medication was sent to pt's pharmacy as requested. Confirmation received.  °

## 2019-09-21 ENCOUNTER — Ambulatory Visit: Payer: Medicare Other | Admitting: Cardiology

## 2019-09-21 ENCOUNTER — Other Ambulatory Visit: Payer: Self-pay | Admitting: Cardiology

## 2019-09-21 ENCOUNTER — Encounter: Payer: Self-pay | Admitting: Cardiology

## 2019-09-21 ENCOUNTER — Other Ambulatory Visit: Payer: Self-pay

## 2019-09-21 VITALS — BP 120/72 | HR 67 | Ht 60.0 in | Wt 163.8 lb

## 2019-09-21 DIAGNOSIS — G459 Transient cerebral ischemic attack, unspecified: Secondary | ICD-10-CM

## 2019-09-21 DIAGNOSIS — R002 Palpitations: Secondary | ICD-10-CM | POA: Diagnosis not present

## 2019-09-21 NOTE — Progress Notes (Signed)
Cardiology Office Note:    Date:  09/21/2019   ID:  [name], DOB [birth year], MRN [id number]  PCP:  Shirline Frees, MD  Cardiologist:  Candee Furbish, MD   Referring MD: Shirline Frees, MD     History of Present Illness:    [name] is a 73 y.o. female with hypertension, hyperlipidemia, obesity with nuclear stress test in 2015 that was low risk, 58% with left hip replacement revision in 2016 here for evaluation of sudden painless vision loss and symptoms of eye stroke in her right eye on Wednesday, May 24, 2018 at water aerobics class.  She remembers that she was having to massage her right temple on the right side of her head due to pain that she was experiencing while she was in the pool during the episode.  She went to go see Dr. Marin Comment, optometrist for further evaluation.  She was sent over to see me to evaluate any cardiovascular risk factors, possibly get carotid Doppler. CT scan in ER was OK.   Dr. Leta Baptist saw.   Interestingly, she does state that 2-3 times per week she will feel her heart race for maybe an hour to at nighttime.  No specific triggers.  She has not been previously diagnosed with atrial fibrillation.  No chest pain fevers chills nausea vomiting syncope.  She does have back pain, spinal issues, suffered a fall in April.  She utilizes a walker currently and Celebrex.  She knows the warnings of Celebrex and Plavix combination.  09/21/2019-here for the follow-up of palpitations.  PACs noted on event monitor.  Diltiazem working well.  No fevers chills nausea vomiting syncope bleeding.  ECHO 06/02/18: - Left ventricle: The cavity size was normal. Wall thickness was   increased in a pattern of moderate LVH. Systolic function was   normal. The estimated ejection fraction was in the range of 60%   to 65%. Wall motion was normal; there were no regional wall   motion abnormalities. Left ventricular diastolic function   parameters were normal. - Aortic valve:  Sclerosis without stenosis. - Atrial septum: No defect or patent foramen ovale was identified.  Past Medical History:  Diagnosis Date  . Abdominal pain 03/06/2014  . Acid reflux 06/06/2017  . Acute respiratory failure ([place]) 05/20/2015   Onset 04/22/15 sp L THR CTa chest 04/22/15 1. Mildly motion and hardware artifact degraded exam. No evidence of pulmonary embolism. 2. Mild to moderate right hemidiaphragm elevation, progressive since 06/19/2007. Right middle lobe Volume loss and right lower lobe collapse/consolidative change, also favored to represent atelectasis  - 05/21/2015  Walked RA x 3 laps @ 185 ft each stopped due to  End of  . Arthritis   . ARTHRITIS 04/23/2008   Qualifier: Diagnosis of  By: Jerral Ralph    . Arthropathy of right shoulder   . Benign essential HTN 04/23/2008   Qualifier: Diagnosis of  By: Jerral Ralph    . Breast cancer ([place])   . Breast cancer ([place])   . Cervical radiculopathy 10/28/2015  . Chest pain, moderate coronary artery risk 04/22/2015  . 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 DDD (degenerative disc disease) 06/06/2017  . DEEP VENOUS THROMBOPHLEBITIS, HX OF 04/23/2008   Qualifier: Diagnosis of  By: Jerral Ralph    . 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 DEGENERATIVE JOINT DISEASE, SPINE 04/23/2008   Qualifier: Diagnosis of  By: Jerral Ralph    .  DIVERTICULOSIS, COLON 12/15/2006   Qualifier: Diagnosis of  By: Jerral Ralph    . DJD (degenerative joint disease)   . 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 Family history of adverse reaction to anesthesia    " My sister had 2 migrine strokes waking up from her back procedure."  . GERD (gastroesophageal reflux disease)   . Headache(784.0)    migraines  . HEADACHE, CHRONIC, HX OF 04/23/2008   Qualifier: Diagnosis of  By: Jerral Ralph    . History of cancer chemotherapy  1996   ALSO RADIATION - BREAST CANCER  . History of skin cancer   . HLD (hyperlipidemia) 06/06/2017  . Hx-TIA (transient ischemic attack)    X2  . Hypercholesterolemia without hypertriglyceridemia 04/23/2008   Qualifier: Diagnosis of  By: Jerral Ralph    . Hyperlipidemia   . Hypertension   . Malignant neoplasm of female breast ([place]) 04/23/2008   Qualifier: Diagnosis of  By: Jerral Ralph    . Obesity 05/21/2015  . Osteopenia 02/2018   T score -1.4 distal third of left radius spine and hips excluded due to degenerative changes and hip surgeries  . Personal history of chemotherapy 1997  . Personal history of radiation therapy 1996  . PONV (postoperative nausea and vomiting)    desat during procedure on 10/2015 led oxygen use for 1 month post-op  . S/P shoulder replacement, right 05/27/2017  . Spondylolisthesis of cervical region 10/28/2015  . Squamous cell carcinoma   . Temporary cerebral vascular dysfunction 04/23/2008   Qualifier: Diagnosis of  By: Jerral Ralph    . Wears glasses     Past Surgical History:  Procedure Laterality Date  . back fusion  2005, 2008, 2011   T4-S1  . BREAST BIOPSY Left 1996  . BREAST LUMPECTOMY Left 1996  . BUNIONECTOMY Bilateral   . CATARACT EXTRACTION W/ INTRAOCULAR LENS  IMPLANT, BILATERAL    . CHOLECYSTECTOMY    . COLONOSCOPY    . HYSTEROSCOPY W/D&C N/A 08/15/2013   Procedure: DILATATION AND CURETTAGE /HYSTEROSCOPY WITH RESECTION;  Surgeon: Princess Bruins, MD;  Location: [facility];  Service: Gynecology;  Laterality: N/A;  . MULTIPLE TOOTH EXTRACTIONS    . NM MYOVIEW LTD  12/13/2013   Low risk study, no areas of significant ischemia. Mild apical, distal anterolateral wall perfusion defect is mostly fixed and likely represents attenuation artifact. EF 58%.  Marland Kitchen REVERSE SHOULDER ARTHROPLASTY Right 05/27/2017   Procedure: REVERSE RIGHT SHOULDER ARTHROPLASTY;  Surgeon: Netta Cedars, MD;  Location: [facility];  Service: Orthopedics;   Laterality: Right;  . ROTATOR CUFF REPAIR Right    x 2  . SHOULDER ARTHROSCOPY Left   . THUMB ARTHROSCOPY Right    left, cyst removal  . TONSILLECTOMY     x 4, kept growing back  . TONSILLECTOMY    . TOTAL HIP ARTHROPLASTY Bilateral 1987, [place]   right  x 2  . TOTAL HIP REVISION Left 04/22/2015   Procedure: LEFT HIP REVISION ACETABULAR COMPONENT;  Surgeon: Rod Can, MD;  Location: [facility];  Service: Orthopedics;  Laterality: Left;    Current Medications: Current Meds  Medication Sig  . acetaminophen (TYLENOL) 500 MG tablet Take 1,000 mg by mouth every 8 (eight) hours as needed for moderate pain.  Marland Kitchen acetaminophen-codeine (TYLENOL #3) 300-30 MG tablet Take 1 tablet by mouth daily as needed for moderate pain.  Marland Kitchen atorvastatin (LIPITOR) 40 MG tablet Take 1 tablet (40 mg total) by mouth daily. Pt needs  to schedule appt with provider for further refills - 2nd attempt  . Biotin 10000 MCG TABS Take 1 tablet by mouth at bedtime.  . Calcium-Vitamin D-Vitamin K (VIACTIV PO) Take 1 tablet by mouth every morning.   . clopidogrel (PLAVIX) 75 MG tablet TAKE 1 TABLET BY MOUTH EVERY DAY  . cycloSPORINE (RESTASIS) 0.05 % ophthalmic emulsion Place 1 drop into both eyes 2 (two) times daily.  Marland Kitchen diltiazem (CARDIZEM CD) 120 MG 24 hr capsule Take 1 capsule (120 mg total) by mouth daily. Please make overdue appt with Dr. Marlou Porch before anymore refills. 2nd attempt  . gabapentin (NEURONTIN) 300 MG capsule Take 300 mg by mouth 2 (two) times daily.   . hydrochlorothiazide (HYDRODIURIL) 12.5 MG tablet TAKE 1 TABLET BY MOUTH EVERY DAY IN THE PM  . losartan (COZAAR) 50 MG tablet Take 50 mg by mouth daily.  Marland Kitchen losartan-hydrochlorothiazide (HYZAAR) 50-12.5 MG tablet Take 1 tablet by mouth daily.  . Multiple Vitamins-Minerals (CENTRUM SILVER PO) Take 1 tablet by mouth daily.  . pantoprazole (PROTONIX) 40 MG tablet Take 40 mg by mouth daily.  . vitamin C (ASCORBIC ACID) 500 MG tablet Take 500 mg by mouth daily.      Allergies:   Demerol [meperidine], Hydrocodone, Sulfa antibiotics, Tincture of benzoin [benzoin], Iodinated diagnostic agents, Dexamethasone, and Oxycodone   Social History   Socioeconomic History  . Marital status: Married    Spouse name: Not on file  . Number of children: Not on file  . Years of education: Not on file  . Highest education level: Not on file  Occupational History  . Occupation: Retired  Scientific laboratory technician  . Financial resource strain: Not on file  . Food insecurity    Worry: Not on file    Inability: Not on file  . Transportation needs    Medical: Not on file    Non-medical: Not on file  Tobacco Use  . Smoking status: Never Smoker  . Smokeless tobacco: Never Used  Substance and Sexual Activity  . Alcohol use: Yes    Alcohol/week: 0.0 standard drinks    Comment: rare   . Drug use: No  . Sexual activity: Yes    Partners: Male    Birth control/protection: Post-menopausal    Comment: 1st intercourse- 56, partners- 71, married- 38 yrs   Lifestyle  . Physical activity    Days per week: Not on file    Minutes per session: Not on file  . Stress: Not on file  Relationships  . Social Herbalist on phone: Not on file    Gets together: Not on file    Attends religious service: Not on file    Active member of club or organization: Not on file    Attends meetings of clubs or organizations: Not on file    Relationship status: Not on file  Other Topics Concern  . Not on file  Social History Narrative   Lives in [place] with her husband.     Family History: The patient's family history includes Breast cancer (age of onset: 15) in her mother; Breast cancer (age of onset: 74) in her maternal grandmother; Cervical cancer in her paternal grandmother; Colon cancer in her father; Heart attack in her father, paternal grandfather, and paternal grandmother; Hyperlipidemia in her father; Hypertension in her father and sister; Stroke in her  maternal grandfather.  ROS:   Please see the history of present illness.     All other systems reviewed  and are negative.  EKGs/Labs/Other Studies Reviewed:    The following studies were reviewed today: Prior medical records, office notes, lab work, echocardiogram, EKG.  Long-term monitor 08/16/2018:  Normal sinus rthyhm with occasional episodes of atrial tachycardia   PAC's noted   No atrial fibrillation, no pauses    Symptomatic fluttering was associated with PAC and atrial tachycardia  If she is willing, let's try diltiazem CD 120 mg PO QD to help suppress.  Thankfully, no atrial fibrillation found.    EKG: EKG from 05/24/2018 personally reviewed shows sinus rhythm with no other abnormalities.  Recent Labs: No results found for requested labs within last 8760 hours.  Recent Lipid Panel No results found for: CHOL, TRIG, HDL, CHOLHDL, VLDL, LDLCALC, LDLDIRECT  Physical Exam:    VS:  BP 120/72   Pulse 67   Ht 5' (1.524 m)   Wt 163 lb 12.8 oz (74.3 kg)   SpO2 92%   BMI 31.99 kg/m     Wt Readings from Last 3 Encounters:  09/21/19 163 lb 12.8 oz (74.3 kg)  12/04/18 161 lb 9.6 oz (73.3 kg)  07/12/18 159 lb (72.1 kg)     GEN: Well nourished, well developed, in no acute distress  HEENT: normal  Neck: no JVD, carotid bruits, or masses Cardiac: RRR; no murmurs, rubs, or gallops,no edema  Respiratory:  clear to auscultation bilaterally, normal work of breathing GI: soft, nontender, nondistended, + BS MS: no deformity or atrophy  Skin: warm and dry, no rash Neuro:  Alert and Oriented x 3, Strength and sensation are intact Psych: euthymic mood, full affect   ASSESSMENT:    1. Palpitations   2. TIA (transient ischemic attack)    PLAN:    In order of problems listed above:  TIA  -No atrial fibrillation on monitor.   -Agree with use of Plavix only.  She understands potential bleeding risk with Plavix and Celebrex.  -Brain MRI/MRA reviewed, mild right carotid  artery stenosis.  No indication for surgery.  Continue with aggressive secondary prevention, currently on high intensity statin, Zocor 40 mg.  Palpitations/PAC's -Feels much better with suppression with diltiazem.  Continue.  She used to take it every other day and would feel more palpitations on the day she did not take diltiazem.  Essential hypertension - Continue with current regimen. Well controlled.  Back pain -Understands warnings of Celebrex. Weight loss.   Right eye amaurosis fugax - Appreciate My Le, O.D. expertise. No changes   Medication Adjustments/Labs and Tests Ordered: Current medicines are reviewed at length with the patient today.  Concerns regarding medicines are outlined above.  Orders Placed This Encounter  Procedures  . EKG 12-Lead   No orders of the defined types were placed in this encounter.   Patient Instructions  Medication Instructions:  The current medical regimen is effective;  continue present plan and medications.  *If you need a refill on your cardiac medications before your next appointment, please call your pharmacy*  Follow-Up: At [facility], you and your health needs are our priority.  As part of our continuing mission to provide you with exceptional heart care, we have created designated Provider Care Teams.  These Care Teams include your primary Cardiologist (physician) and Advanced Practice Providers (APPs -  Physician Assistants and Nurse Practitioners) who all work together to provide you with the care you need, when you need it.  Your next appointment:   12 months  The format for your next appointment:   In  Person  Provider:   You may see Candee Furbish, MD or one of the following Advanced Practice Providers on your designated Care Team:    Truitt Merle, NP  Cecilie Kicks, NP  Kathyrn Drown, NP   Thank you for choosing [facility]!!        Signed, Candee Furbish, MD  09/21/2019 2:13 PM    [facility]

## 2019-09-21 NOTE — Patient Instructions (Signed)
Medication Instructions:  The current medical regimen is effective;  continue present plan and medications.  *If you need a refill on your cardiac medications before your next appointment, please call your pharmacy*  Follow-Up: At [facility], you and your health needs are our priority.  As part of our continuing mission to provide you with exceptional heart care, we have created designated Provider Care Teams.  These Care Teams include your primary Cardiologist (physician) and Advanced Practice Providers (APPs -  Physician Assistants and Nurse Practitioners) who all work together to provide you with the care you need, when you need it.  Your next appointment:   12 months  The format for your next appointment:   In Person  Provider:   You may see Mark Skains, MD or one of the following Advanced Practice Providers on your designated Care Team:    Lori Gerhardt, NP  Laura Ingold, NP  Jill McDaniel, NP   Thank you for choosing [facility]!!      

## 2019-09-24 ENCOUNTER — Telehealth: Payer: Self-pay | Admitting: Podiatry

## 2019-09-24 NOTE — Telephone Encounter (Signed)
DOS: 10/19/2019  SURGICAL PROCEDURES: [place] [id number]), Metatarsal Osteotomy 2nd [phone]), Hammertoe Repair 2nd, 3rd, 4th, & 5th Toes [phone])  POLICY EFFECTIVE: [id number] - 11/08/2019  Notification or Prior Authorization is not required for the requested services  This [facility] Advantage members plan does not currently require a prior authorization for these services. If you have general questions about the prior authorization requirements, please call us at [phone] or visit [email or web address] > Clinician Resources > Advance and Admission Notification Requirements. The number above acknowledges your notification. Please write this number down for future reference. Notification is not a guarantee of coverage or payment.  Decision ID [id number]  The number above acknowledges your inquiry and our response. Please write this number down and refer to it for future inquiries. Coverage and payment for an item or service is governed by the member's benefit plan document, and, if applicable, the provider's participation agreement with the Health Plan.

## 2019-10-08 ENCOUNTER — Other Ambulatory Visit: Payer: Self-pay

## 2019-10-08 ENCOUNTER — Ambulatory Visit: Payer: Medicare Other | Admitting: Women's Health

## 2019-10-08 ENCOUNTER — Encounter: Payer: Self-pay | Admitting: Women's Health

## 2019-10-08 VITALS — BP 122/80

## 2019-10-08 DIAGNOSIS — R300 Dysuria: Secondary | ICD-10-CM

## 2019-10-08 DIAGNOSIS — N3000 Acute cystitis without hematuria: Secondary | ICD-10-CM | POA: Diagnosis not present

## 2019-10-08 MED ORDER — NITROFURANTOIN MONOHYD MACRO 100 MG PO CAPS: 100.0000 mg | ORAL_CAPSULE | Freq: Two times a day (BID) | ORAL | 0 refills | Status: AC

## 2019-10-08 NOTE — Progress Notes (Signed)
73 year old MWF G31P2,    stepdaughter recently died from metastatic ovarian cancer.  Presents with complaint of urinary frequency with pain and burning that started 2 days ago.  Reports symptoms similar to UTIs she has had in the past.  Took several doses of "old" Macrobid and Azo with some relief.  Denies back pain, fever, vaginal discharge, itching or odor.  Rare sexual activity husband's health.  Postmenopausal on no HRT with no bleeding.  History of breast cancer greater than 20 years ago.  Medical problems include hypertension, osteoarthritis requiring 2 hip replacements, 2 shoulder replacements and is scheduled for foot surgery.  History of degenerative disc disease.  Exam: Appears well.  No CVAT.  Abdomen soft, nontender.   UA urine orange/cloudy, trace blood, negative leukocytes, 0-5 WBCs, 3-10 RBCs, few bacteria  Probable UTI  Plan: Macrobid twice daily for 7 days, take with food, increase water, Azo if needed.  Urine culture pending.  Allergy to Septra.  UTI prevention discussed.  Condolences given on recent death of 51.

## 2019-10-08 NOTE — Patient Instructions (Signed)

## 2019-10-10 LAB — URINALYSIS, COMPLETE W/RFL CULTURE
Bilirubin Urine: NEGATIVE
Hyaline Cast: NONE SEEN /LPF
Leukocyte Esterase: NEGATIVE
Nitrites, Initial: POSITIVE — AB
Protein, ur: NEGATIVE
Specific Gravity, Urine: 1.02 (ref 1.001–1.03)
pH: 7.5 (ref 5.0–8.0)

## 2019-10-10 LAB — CULTURE INDICATED

## 2019-10-10 LAB — URINE CULTURE
MICRO NUMBER:: 1145712
Result:: NO GROWTH
SPECIMEN QUALITY:: ADEQUATE

## 2019-10-17 ENCOUNTER — Other Ambulatory Visit: Payer: Self-pay | Admitting: Podiatry

## 2019-10-17 MED ORDER — ONDANSETRON HCL 4 MG PO TABS: 4.0000 mg | ORAL_TABLET | Freq: Three times a day (TID) | ORAL | 0 refills | Status: AC | PRN

## 2019-10-17 MED ORDER — HYDROMORPHONE HCL 4 MG PO TABS: 4.0000 mg | ORAL_TABLET | Freq: Four times a day (QID) | ORAL | 0 refills | Status: AC | PRN

## 2019-10-17 MED ORDER — CEPHALEXIN 500 MG PO CAPS: 500.0000 mg | ORAL_CAPSULE | Freq: Three times a day (TID) | ORAL | 0 refills | Status: AC

## 2019-10-19 ENCOUNTER — Encounter: Payer: Self-pay | Admitting: Podiatry

## 2019-10-19 DIAGNOSIS — M21542 Acquired clubfoot, left foot: Secondary | ICD-10-CM | POA: Diagnosis not present

## 2019-10-19 DIAGNOSIS — M2012 Hallux valgus (acquired), left foot: Secondary | ICD-10-CM | POA: Diagnosis not present

## 2019-10-19 DIAGNOSIS — M2042 Other hammer toe(s) (acquired), left foot: Secondary | ICD-10-CM | POA: Diagnosis not present

## 2019-10-22 ENCOUNTER — Ambulatory Visit: Payer: Medicare Other | Admitting: Podiatry

## 2019-10-24 ENCOUNTER — Ambulatory Visit (INDEPENDENT_AMBULATORY_CARE_PROVIDER_SITE_OTHER): Payer: Medicare Other

## 2019-10-24 ENCOUNTER — Ambulatory Visit (INDEPENDENT_AMBULATORY_CARE_PROVIDER_SITE_OTHER): Payer: Self-pay | Admitting: Podiatry

## 2019-10-24 ENCOUNTER — Encounter: Payer: Self-pay | Admitting: Podiatry

## 2019-10-24 ENCOUNTER — Other Ambulatory Visit: Payer: Self-pay

## 2019-10-24 VITALS — BP 127/67 | HR 84 | Temp 99.1°F

## 2019-10-24 DIAGNOSIS — M2042 Other hammer toe(s) (acquired), left foot: Secondary | ICD-10-CM

## 2019-10-24 DIAGNOSIS — Z9889 Other specified postprocedural states: Secondary | ICD-10-CM | POA: Diagnosis not present

## 2019-10-24 NOTE — Progress Notes (Signed)
She presents today for first postop visit date of surgery is 10/19/2019 status post [facility] bunion repair second metatarsal osteotomy hammertoe repair second with screw and hammertoe repair #3 #4 with pins and derotational arthroplasty fifth toe left.  She denies nausea vomiting states pruritus with the use of the Dilaudid she also states that she has run a fever of 100.8 twice.  States that she seems to be doing better now.  Objective: Rest of dressing intact was removed demonstrates mildly edematous foot K wires are in place toes are rectus foot looks much better.  Radiographs taken today demonstrate internal fixation in good position.  Nice reduction of angular deformities.  K wires are in place internal fixation is in place and intact.  Assessment: Well-healing surgical foot left.  Plan: Redressed today dressed a compressive dressing encouraged her to continue to ambulate with the boot.  Encouraged her to stop the narcotic and start Advil 600 mg with 1 extra strength Tylenol 3 times a day with food.  She understands that and is amenable to it for pain.  Follow-up with me in 1 week.

## 2019-10-31 ENCOUNTER — Ambulatory Visit (INDEPENDENT_AMBULATORY_CARE_PROVIDER_SITE_OTHER): Payer: Medicare Other | Admitting: Podiatry

## 2019-10-31 ENCOUNTER — Encounter: Payer: Self-pay | Admitting: Podiatry

## 2019-10-31 ENCOUNTER — Other Ambulatory Visit: Payer: Self-pay

## 2019-10-31 DIAGNOSIS — M2042 Other hammer toe(s) (acquired), left foot: Secondary | ICD-10-CM

## 2019-10-31 DIAGNOSIS — Z9889 Other specified postprocedural states: Secondary | ICD-10-CM

## 2019-10-31 NOTE — Telephone Encounter (Signed)
error 

## 2019-10-31 NOTE — Progress Notes (Signed)
She presents today for second postop visit date of surgery was October 19, 2019 status post [facility] bunionectomy second metatarsal osteotomy hammertoe repair second and hammertoe repair #3 #4 with screws and pins #5 without.  She states that she is doing well and feels a lot better this week than it did last week.  Objective: Vital signs are stable she is alert oriented x3 presents with a walker today utilizing a cam walker was removed demonstrates dry sterile dressing intact sutures are intact margins well coapted there is no erythema edema cellulitis drainage or odor appears to be healing very nicely.  K wires are in place to toes #3 #4 the left foot.  No signs of infection sutures were removed margins remain well coapted.  Assessment: Well-healing surgical foot.  Plan: Redressed today with a compression anklet and instructed on how to use a Darco shoe.  Instructed her to use her cam walker if she removed leaving her home otherwise the Darco shoe in the house where spinal surf ice.  I will follow-up with her in 2 weeks for reevaluation.  She is not to sleep with the anklet at bedtime.

## 2019-11-01 ENCOUNTER — Other Ambulatory Visit: Payer: Self-pay | Admitting: Cardiology

## 2019-11-14 ENCOUNTER — Other Ambulatory Visit: Payer: Self-pay

## 2019-11-14 ENCOUNTER — Ambulatory Visit: Payer: Medicare PPO | Admitting: Podiatry

## 2019-11-14 ENCOUNTER — Ambulatory Visit (INDEPENDENT_AMBULATORY_CARE_PROVIDER_SITE_OTHER): Payer: Medicare PPO

## 2019-11-14 DIAGNOSIS — M2042 Other hammer toe(s) (acquired), left foot: Secondary | ICD-10-CM | POA: Diagnosis not present

## 2019-11-14 DIAGNOSIS — Z9889 Other specified postprocedural states: Secondary | ICD-10-CM

## 2019-11-14 DIAGNOSIS — M21962 Unspecified acquired deformity of left lower leg: Secondary | ICD-10-CM | POA: Diagnosis not present

## 2019-11-14 NOTE — Progress Notes (Signed)
She presents today date of surgery 10/19/2019 status post second metatarsal osteotomy states that she is doing quite well.  Objective: Vital signs are stable alert and oriented x3.  Pulses are palpable.  Internal fixation and external fixation is still intact.  Assessment: Well-healing surgical toes.  Plan: Follow-up with her in 3 weeks.

## 2019-11-27 ENCOUNTER — Telehealth: Payer: Self-pay | Admitting: Cardiology

## 2019-11-27 NOTE — Telephone Encounter (Signed)
We are recommending the COVID-19 vaccine to all of our patients. Cardiac medications (including blood thinners) should not deter anyone from being vaccinated and there is no need to hold any of those medications prior to vaccine administration.     Currently, there is a hotline to call (active 11/16/19) to schedule vaccination appointments as no walk-ins will be accepted.   Number: [phone].    If an appointment is not available please go to [email or web address] to sign up for notification when additional vaccine appointments are available.   If you have further questions or concerns about the vaccine process, please visit [email or web address] or contact your primary care physician.   I have informed patient of instructions.   

## 2019-11-28 ENCOUNTER — Ambulatory Visit (INDEPENDENT_AMBULATORY_CARE_PROVIDER_SITE_OTHER): Payer: Medicare PPO | Admitting: Podiatry

## 2019-11-28 ENCOUNTER — Ambulatory Visit (INDEPENDENT_AMBULATORY_CARE_PROVIDER_SITE_OTHER): Payer: Medicare PPO

## 2019-11-28 ENCOUNTER — Encounter: Payer: Self-pay | Admitting: Podiatry

## 2019-11-28 ENCOUNTER — Other Ambulatory Visit: Payer: Self-pay

## 2019-11-28 DIAGNOSIS — M21962 Unspecified acquired deformity of left lower leg: Secondary | ICD-10-CM | POA: Diagnosis not present

## 2019-11-28 DIAGNOSIS — M2042 Other hammer toe(s) (acquired), left foot: Secondary | ICD-10-CM

## 2019-11-28 DIAGNOSIS — Z9889 Other specified postprocedural states: Secondary | ICD-10-CM

## 2019-11-28 NOTE — Progress Notes (Signed)
She presents today date of surgery October 19, 2019 she is status post [facility] bunion repair second metatarsal osteotomy hammertoe repair #2 with screw #3 #4 with pins left foot.  She states that it hurts some occasionally but most of the time is doing pretty well and ready to have these pins removed.  Objective: Vital signs are stable she is alert and oriented x3.  Pulses are palpable.  There is minimal edema about the left foot that K wires are approximately two thirds the way out of the toe at this point.  Radiographs taken today demonstrate sufficient healing of the PIPJ's to remove the K wires.  She has good healing about the first metatarsophalangeal joint second metatarsal.  Assessment: Well-healing surgical foot left.  Plan: I will follow-up with her in about 2 weeks number allow her to get back into regular shoes.

## 2019-12-06 ENCOUNTER — Encounter: Payer: Self-pay | Admitting: Podiatry

## 2019-12-12 ENCOUNTER — Encounter: Payer: Self-pay | Admitting: Podiatry

## 2019-12-12 ENCOUNTER — Other Ambulatory Visit: Payer: Self-pay

## 2019-12-12 ENCOUNTER — Ambulatory Visit (INDEPENDENT_AMBULATORY_CARE_PROVIDER_SITE_OTHER): Payer: Medicare PPO | Admitting: Podiatry

## 2019-12-12 ENCOUNTER — Ambulatory Visit (INDEPENDENT_AMBULATORY_CARE_PROVIDER_SITE_OTHER): Payer: Medicare PPO

## 2019-12-12 DIAGNOSIS — M2042 Other hammer toe(s) (acquired), left foot: Secondary | ICD-10-CM | POA: Diagnosis not present

## 2019-12-12 DIAGNOSIS — M21962 Unspecified acquired deformity of left lower leg: Secondary | ICD-10-CM

## 2019-12-12 DIAGNOSIS — Z9889 Other specified postprocedural states: Secondary | ICD-10-CM

## 2019-12-12 NOTE — Progress Notes (Signed)
She presents today postop visit date of surgery 10/19/2019 status post metatarsal osteotomy second left hammertoe repair #2 3 and 4 of her left foot.  States that she is doing pretty well still has a lot of swelling otherwise it seems to be okay.  Objective: Vital signs are stable she is alert and oriented x3 presents in her Darco shoe today with her compression anklet intact.  Once removed demonstrates moderate edema no erythema cellulitis drainage or odor.  Radiographs taken today demonstrate well-positioned second toe and a slightly contracted second metatarsophalangeal joint as opposed to previous evaluations.  Otherwise she has some mild plantar flexion at the PIPJ's of the third and fourth toes.  Assessment: Well-healing surgical toes.  Plan: 1 follow-up with her in about a month at which time I fully expect her to be back into regular shoe gear.  I did let her know that it is okay to go back to swim class in about 2 weeks.  I will follow-up with her in about 1 month.

## 2020-01-02 ENCOUNTER — Other Ambulatory Visit: Payer: Self-pay

## 2020-01-03 ENCOUNTER — Ambulatory Visit: Payer: Medicare PPO | Admitting: Obstetrics & Gynecology

## 2020-01-03 ENCOUNTER — Encounter: Payer: Self-pay | Admitting: Obstetrics & Gynecology

## 2020-01-03 VITALS — BP 124/80 | Ht 60.0 in | Wt 164.0 lb

## 2020-01-03 DIAGNOSIS — Z853 Personal history of malignant neoplasm of breast: Secondary | ICD-10-CM

## 2020-01-03 DIAGNOSIS — N952 Postmenopausal atrophic vaginitis: Secondary | ICD-10-CM

## 2020-01-03 DIAGNOSIS — Z01419 Encounter for gynecological examination (general) (routine) without abnormal findings: Secondary | ICD-10-CM | POA: Diagnosis not present

## 2020-01-03 DIAGNOSIS — C50912 Malignant neoplasm of unspecified site of left female breast: Secondary | ICD-10-CM

## 2020-01-03 DIAGNOSIS — Z780 Asymptomatic menopausal state: Secondary | ICD-10-CM

## 2020-01-03 MED ORDER — ESTRADIOL 0.1 MG/GM VA CREA: 0.2500 | TOPICAL_CREAM | VAGINAL | 4 refills | Status: AC

## 2020-01-03 NOTE — Progress Notes (Signed)
[name] 04/20/1946 [id number]   History:    74 y.o. G2P2L2 Married.  Adopted daughter with Stage IV Ovarian Ca at 19 yo.  RP:  Established patient presenting for annual gyn exam   HPI: Menopause, well on no hormone replacement therapy.  No postmenopausal bleeding.  History of left breast cancer.  Status post left breast lumpectomy in 1996.  No pelvic pain.  Rarely sexually active as her husband has erectile dysfunction, but husband is receiving treatment by Urologist now and patient had a very painful IC with prolonged erection. Urine and bowel movements normal.  Body mass index 32.03. Physically active.  Health labs with family physician.  Past medical history,surgical history, family history and social history were all reviewed and documented in the EPIC chart.  Gynecologic History No LMP recorded. Patient is postmenopausal.  Obstetric History OB History  Gravida Para Term Preterm AB Living  2 2 2     2   SAB TAB Ectopic Multiple Live Births               # Outcome Date GA Lbr Len/2nd Weight Sex Delivery Anes PTL Lv  2 Term           1 Term              ROS: A ROS was performed and pertinent positives and negatives are included in the history.  GENERAL: No fevers or chills. HEENT: No change in vision, no earache, sore throat or sinus congestion. NECK: No pain or stiffness. CARDIOVASCULAR: No chest pain or pressure. No palpitations. PULMONARY: No shortness of breath, cough or wheeze. GASTROINTESTINAL: No abdominal pain, nausea, vomiting or diarrhea, melena or bright red blood per rectum. GENITOURINARY: No urinary frequency, urgency, hesitancy or dysuria. MUSCULOSKELETAL: No joint or muscle pain, no back pain, no recent trauma. DERMATOLOGIC: No rash, no itching, no lesions. ENDOCRINE: No polyuria, polydipsia, no heat or cold intolerance. No recent change in weight. HEMATOLOGICAL: No anemia or easy bruising or bleeding. NEUROLOGIC: No headache, seizures, numbness, tingling or  weakness. PSYCHIATRIC: No depression, no loss of interest in normal activity or change in sleep pattern.     Exam:   BP 124/80 (BP Location: Right Arm, Patient Position: Sitting, Cuff Size: Normal)   Ht 5' (1.524 m)   Wt 164 lb (74.4 kg)   BMI 32.03 kg/m   Body mass index is 32.03 kg/m.  General appearance : Well developed well nourished female. No acute distress HEENT: Eyes: no retinal hemorrhage or exudates,  Neck supple, trachea midline, no carotid bruits, no thyroidmegaly Lungs: Clear to auscultation, no rhonchi or wheezes, or rib retractions  Heart: Regular rate and rhythm, no murmurs or gallops Breast:Examined in sitting and supine position were symmetrical in appearance, no palpable masses or tenderness,  no skin retraction, no nipple inversion, no nipple discharge, no skin discoloration, no axillary or supraclavicular lymphadenopathy Abdomen: no palpable masses or tenderness, no rebound or guarding Extremities: no edema or skin discoloration or tenderness  Pelvic: Vulva: Normal             Vagina: No gross lesions or discharge  Cervix: No gross lesions or discharge  Uterus  AV, normal size, shape and consistency, non-tender and mobile  Adnexa  Without masses or tenderness  Anus: Normal   Assessment/Plan:  74 y.o. female for annual exam   1. Well female exam with routine gynecological exam Normal gynecologic exam in menopause.  Last Pap test March 2019 was negative, no indication  to repeat this year.  Breast exam normal.  Screening mammogram September 2020 was negative.  Colonoscopy August 2015.  Health labs with family physician.  Body mass index 32.03.  Recommend a slightly lower calorie/carb diet and aerobic activities 5 times a week with light weightlifting every 2 days.  2. Postmenopause Well on no hormone replacement therapy.  No postmenopausal bleeding.  Very mild osteopenia on bone density April 2019 at the left third forearm with a T score at -1.4.  We will repeat  a bone density in April 2022.  Vitamin D supplements, calcium intake of 1200 mg daily and regular weightbearing physical activities.  3. Post-menopausal atrophic vaginitis We will continue to use Estrace vaginal cream a quarter of an applicator twice a week.  Recommend coconut oil as well when sexually active.  4. Malignant neoplasm of left female breast, unspecified estrogen receptor status, unspecified site of breast [facility]) Screening mammogram September 2020 was negative.  Other orders - estradiol (ESTRACE VAGINAL) 0.1 MG/GM vaginal cream; Place [id number] Applicatorfuls vaginally 2 (two) times a week.  Princess Bruins MD, 2:36 PM 01/03/2020

## 2020-01-05 ENCOUNTER — Encounter: Payer: Self-pay | Admitting: Obstetrics & Gynecology

## 2020-01-05 NOTE — Patient Instructions (Signed)
1. Well female exam with routine gynecological exam Normal gynecologic exam in menopause.  Last Pap test March 2019 was negative, no indication to repeat this year.  Breast exam normal.  Screening mammogram September 2020 was negative.  Colonoscopy August 2015.  Health labs with family physician.  Body mass index 32.03.  Recommend a slightly lower calorie/carb diet and aerobic activities 5 times a week with light weightlifting every 2 days.  2. Postmenopause Well on no hormone replacement therapy.  No postmenopausal bleeding.  Very mild osteopenia on bone density April 2019 at the left third forearm with a T score at -1.4.  We will repeat a bone density in April 2022.  Vitamin D supplements, calcium intake of 1200 mg daily and regular weightbearing physical activities.  3. Post-menopausal atrophic vaginitis We will continue to use Estrace vaginal cream a quarter of an applicator twice a week.  Recommend coconut oil as well when sexually active.  4. Malignant neoplasm of left female breast, unspecified estrogen receptor status, unspecified site of breast [facility]) Screening mammogram September 2020 was negative.  Other orders - estradiol (ESTRACE VAGINAL) 0.1 MG/GM vaginal cream; Place [id number] Applicatorfuls vaginally 2 (two) times a week.  Kellie, it was a pleasure seeing you today!

## 2020-01-16 ENCOUNTER — Ambulatory Visit (INDEPENDENT_AMBULATORY_CARE_PROVIDER_SITE_OTHER): Payer: Medicare PPO | Admitting: Podiatry

## 2020-01-16 ENCOUNTER — Other Ambulatory Visit: Payer: Self-pay

## 2020-01-16 ENCOUNTER — Ambulatory Visit (INDEPENDENT_AMBULATORY_CARE_PROVIDER_SITE_OTHER): Payer: Medicare PPO

## 2020-01-16 ENCOUNTER — Encounter: Payer: Self-pay | Admitting: Podiatry

## 2020-01-16 VITALS — Temp 97.2°F

## 2020-01-16 DIAGNOSIS — Z9889 Other specified postprocedural states: Secondary | ICD-10-CM

## 2020-01-16 DIAGNOSIS — M21962 Unspecified acquired deformity of left lower leg: Secondary | ICD-10-CM

## 2020-01-16 DIAGNOSIS — M2042 Other hammer toe(s) (acquired), left foot: Secondary | ICD-10-CM

## 2020-01-16 NOTE — Progress Notes (Signed)
She presents today date of surgery 10/19/2019 status post second metatarsal osteotomy and hammertoe repair.  She states that is doing great it swells occasionally but most of the time is doing this perfectly.  Objective: Vital signs are stable she is alert oriented x3 she has no erythema edema cellulitis drainage or odor at this point she got great range of motion at the metatarsophalangeal joint no pain on loading of the forefoot.  Assessment: Well-healing surgical foot radiographs confirm this with internal fixation intact.  Plan: We will follow-up with her on an as-needed basis.

## 2020-01-31 ENCOUNTER — Telehealth: Payer: Self-pay | Admitting: *Deleted

## 2020-01-31 NOTE — Telephone Encounter (Signed)
I am calling to cancel my appointment with Dr. Milinda Pointer.  He said if I was doing better, I didn't have to come back in.  Can you tell him I am moving to [place], [place]?"  I will cancel your appointment and I will let Dr. Milinda Pointer know.  Please take care of yourself.

## 2020-02-27 ENCOUNTER — Encounter: Payer: Medicare PPO | Admitting: Podiatry
